# Patient Record
Sex: Male | Born: 1980 | Race: White | Hispanic: No | Marital: Married | Smoking: Former smoker
Health system: Southern US, Community
[De-identification: ages and names within clinical notes are randomized; demographics above are authoritative.]

## PROBLEM LIST (undated history)

## (undated) DIAGNOSIS — G2579 Other drug induced movement disorders: Secondary | ICD-10-CM

## (undated) DIAGNOSIS — F419 Anxiety disorder, unspecified: Secondary | ICD-10-CM

## (undated) DIAGNOSIS — G8929 Other chronic pain: Secondary | ICD-10-CM

## (undated) DIAGNOSIS — F329 Major depressive disorder, single episode, unspecified: Secondary | ICD-10-CM

## (undated) DIAGNOSIS — E349 Endocrine disorder, unspecified: Secondary | ICD-10-CM

## (undated) DIAGNOSIS — M1990 Unspecified osteoarthritis, unspecified site: Secondary | ICD-10-CM

## (undated) DIAGNOSIS — G9081 Serotonin syndrome: Secondary | ICD-10-CM

## (undated) DIAGNOSIS — G4730 Sleep apnea, unspecified: Secondary | ICD-10-CM

## (undated) DIAGNOSIS — F32A Depression, unspecified: Secondary | ICD-10-CM

## (undated) DIAGNOSIS — F909 Attention-deficit hyperactivity disorder, unspecified type: Secondary | ICD-10-CM

## (undated) DIAGNOSIS — M549 Dorsalgia, unspecified: Secondary | ICD-10-CM

## (undated) HISTORY — DX: Depression, unspecified: F32.A

## (undated) HISTORY — DX: Dorsalgia, unspecified: M54.9

## (undated) HISTORY — DX: Other chronic pain: G89.29

## (undated) HISTORY — DX: Attention-deficit hyperactivity disorder, unspecified type: F90.9

## (undated) HISTORY — DX: Other drug induced movement disorders: G25.79

## (undated) HISTORY — DX: Serotonin syndrome: G90.81

## (undated) HISTORY — DX: Major depressive disorder, single episode, unspecified: F32.9

## (undated) HISTORY — DX: Unspecified osteoarthritis, unspecified site: M19.90

## (undated) HISTORY — DX: Endocrine disorder, unspecified: E34.9

## (undated) HISTORY — DX: Depression, unspecified: F41.9

## (undated) HISTORY — DX: Sleep apnea, unspecified: G47.30

---
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
## 2016-05-23 ENCOUNTER — Ambulatory Visit (INDEPENDENT_AMBULATORY_CARE_PROVIDER_SITE_OTHER): Payer: 59 | Admitting: Primary Care

## 2016-05-23 ENCOUNTER — Ambulatory Visit (INDEPENDENT_AMBULATORY_CARE_PROVIDER_SITE_OTHER)
Admission: RE | Admit: 2016-05-23 | Discharge: 2016-05-23 | Disposition: A | Discharge status: Home / Self Care | Payer: 59 | Source: Ambulatory Visit | Attending: Primary Care | Admitting: Primary Care

## 2016-05-23 ENCOUNTER — Encounter: Payer: Self-pay | Admitting: Primary Care

## 2016-05-23 VITALS — BP 152/110 | HR 96 | Temp 98.0°F | Ht 71.0 in | Wt 205.6 lb

## 2016-05-23 DIAGNOSIS — L600 Ingrowing nail: Secondary | ICD-10-CM

## 2016-05-23 DIAGNOSIS — E349 Endocrine disorder, unspecified: Secondary | ICD-10-CM

## 2016-05-23 DIAGNOSIS — F329 Major depressive disorder, single episode, unspecified: Secondary | ICD-10-CM

## 2016-05-23 DIAGNOSIS — M5440 Lumbago with sciatica, unspecified side: Secondary | ICD-10-CM

## 2016-05-23 DIAGNOSIS — M79642 Pain in left hand: Secondary | ICD-10-CM

## 2016-05-23 DIAGNOSIS — F418 Other specified anxiety disorders: Secondary | ICD-10-CM

## 2016-05-23 DIAGNOSIS — F419 Anxiety disorder, unspecified: Secondary | ICD-10-CM

## 2016-05-23 DIAGNOSIS — G8929 Other chronic pain: Secondary | ICD-10-CM

## 2016-05-23 DIAGNOSIS — M5442 Lumbago with sciatica, left side: Secondary | ICD-10-CM

## 2016-05-23 MED ORDER — PREDNISONE 20 MG PO TABS: ORAL_TABLET | ORAL | 0 refills | Status: DC

## 2016-05-23 NOTE — Progress Notes (Signed)
Subjective:    Patient ID: Darren Mason, male    DOB: [birth year], 35 y.o.   MRN: [id number]  HPI  Darren Mason is a 35 year old male who presents today to establish care and discuss the problems mentioned below. Will obtain old records. He still follows with his PCP in [place], [place].   1) Anxiety and Depression: Diagnosed 5 years ago. Currently managed on Wellbutrin XL 300 mg and Lexapro 10 mg. Overall he feels well managed, he does experience feeling down during winter months mostly. Denies SI/HI.   2) Chronic Back Pain: MRI three years ago with evidence of three bulging discs to his lower back with arthritis. He does experience radiculopathy to his left lower extremity. Managed on Norco, diclofenac, carisoprodol and follows with pain management (in [place], [place]).   3) Testosterone Deficiency: History of fatigue, sleeping 12 hours with frequent naps. Noted to have low testosterone levels several years ago and placed on supplemental testosterone. Currently managed on testosterone cypionate which was provided by his prior PCP. He's been managed on testosterone for the past 3-4 years. He continues to see his PCP who provides this.   4) Hand Pain: Also with swelling. Located to the left hand which has been present for the past 1-2 weeks. His symptoms began with a blister, then he noticed tenderness to the dorsal left hand. He denies injury/trauma, changes in skin color. He does work with his hands often. He has a history of moderate arthritis.   5) Ingrown Toe Nails: Located to bilateral great toes since his adolescence. Over the past several years he's noticed that his ingrown toe nails have become more painful. He generally cuts these out himself, but they continue to grow back. Over the last several weeks he's noticed inflammation, swelling, and increased pain.   Review of Systems  Constitutional: Negative for fever.  Respiratory: Negative for shortness of breath.   Cardiovascular: Negative  for chest pain.  Genitourinary:       Managed on IM testosterone  Musculoskeletal: Positive for arthralgias and back pain.  Skin: Negative for color change.  Allergic/Immunologic: Negative for environmental allergies.  Neurological: Negative for weakness.  Hematological: Negative for adenopathy.  Psychiatric/Behavioral: Negative for suicidal ideas.       No past medical history on file.   Social History   Social History  . Marital status: Married    Spouse name: N/A  . Number of children: N/A  . Years of education: N/A   Occupational History  . Not on file.   Social History Main Topics  . Smoking status: Never Smoker  . Smokeless tobacco: Not on file  . Alcohol use Yes  . Drug use: Unknown  . Sexual activity: Not on file   Other Topics Concern  . Not on file   Social History Narrative  . No narrative on file    No past surgical history on file.  No family history on file.  No Known Allergies  No current outpatient prescriptions on file prior to visit.   No current facility-administered medications on file prior to visit.     BP (!) 152/110   Pulse 96   Temp 98 F (36.7 C) (Oral)   Ht 5\' 11"  (1.803 m)   Wt 205 lb 9.6 oz (93.3 kg)   SpO2 98%   BMI 28.68 kg/m    Objective:   Physical Exam  Constitutional: He is oriented to person, place, and time. He appears well-nourished.  HENT:  Head:  Normocephalic.  Neck: Neck supple.  Cardiovascular: Normal rate and regular rhythm.   Pulmonary/Chest: Effort normal and breath sounds normal. He has no wheezes. He has no rales.  Musculoskeletal: Normal range of motion.       Left wrist: He exhibits normal range of motion, no tenderness and no swelling.       Right hand: He exhibits tenderness, bony tenderness and swelling. He exhibits normal range of motion and normal capillary refill. Normal sensation noted. Normal strength noted.  Neurological: He is alert and oriented to person, place, and time.  Skin: Skin  is warm and dry. No erythema.  Mild to moderate swelling to left dorsal hand. Good ROM. Tender to general left hand.  Psychiatric: He has a normal mood and affect.          Assessment & Plan:  Hand Swelling:  Located to left hand, also with swelling. No recent trauma. Based off of exam appears to be arthritic flare. Will treat with prednisone burst. Discussed to refrain from diclofenac while on prednisone. Xray without evidence of bony involvement/infection. Ice, rest, follow up PRN.  Morrie Sheldonlark,[name] Kendal, NP

## 2016-05-23 NOTE — Patient Instructions (Addendum)
Complete xray(s) prior to leaving today. I will notify you of your results once received.  Start Prednisone 20 mg. Take 2 tablets by mouth daily for 5 days. Do not take Diclofenac while taking prednisone for swelling and pain.  You will be contacted regarding your referral to Podiatry.  Please let us know if you have not heard back within one week.   It was a pleasure to meet you today! Please don't hesitate to call me with any questions. Welcome to [facility]!

## 2016-05-23 NOTE — Progress Notes (Signed)
Pre visit review using our clinic review tool, if applicable. No additional management support is needed unless otherwise documented below in the visit note. 

## 2016-05-24 DIAGNOSIS — E349 Endocrine disorder, unspecified: Secondary | ICD-10-CM | POA: Insufficient documentation

## 2016-05-24 DIAGNOSIS — M1990 Unspecified osteoarthritis, unspecified site: Secondary | ICD-10-CM | POA: Insufficient documentation

## 2016-05-24 DIAGNOSIS — F419 Anxiety disorder, unspecified: Secondary | ICD-10-CM

## 2016-05-24 DIAGNOSIS — G8929 Other chronic pain: Secondary | ICD-10-CM | POA: Insufficient documentation

## 2016-05-24 DIAGNOSIS — F329 Major depressive disorder, single episode, unspecified: Secondary | ICD-10-CM | POA: Insufficient documentation

## 2016-05-24 DIAGNOSIS — M549 Dorsalgia, unspecified: Secondary | ICD-10-CM

## 2016-05-24 DIAGNOSIS — L600 Ingrowing nail: Secondary | ICD-10-CM | POA: Insufficient documentation

## 2016-05-24 NOTE — Assessment & Plan Note (Signed)
Chronic, overall worse. Exam today with obvious irritation without infection to bilateral great toes. Referral placed for podiatry.

## 2016-05-24 NOTE — Assessment & Plan Note (Signed)
Evidence of 3 bulging discs to L spine per MRI. Managed by pain management in [place], [place].

## 2016-05-24 NOTE — Assessment & Plan Note (Signed)
Managed on Wellbutrin and Lexapro, feels well managed. Continue current regimen.

## 2016-05-24 NOTE — Assessment & Plan Note (Signed)
Managed on IM testosterone provided by his PCP in [place]. Discussed that I do not manage this and that a referral to Urology would be in order. He states that he will stay with his current PCP for his refills.

## 2016-05-24 NOTE — Assessment & Plan Note (Signed)
Diagnosed per orthopedics. Suspect hand irritation related to arthritic flare.

## 2016-06-03 ENCOUNTER — Ambulatory Visit: Payer: 59 | Admitting: Podiatry

## 2016-12-16 ENCOUNTER — Other Ambulatory Visit (HOSPITAL_COMMUNITY): Payer: Self-pay | Admitting: Pain Medicine

## 2016-12-16 DIAGNOSIS — M5416 Radiculopathy, lumbar region: Secondary | ICD-10-CM

## 2017-01-19 ENCOUNTER — Emergency Department (HOSPITAL_COMMUNITY): Payer: 59

## 2017-01-19 ENCOUNTER — Inpatient Hospital Stay (HOSPITAL_COMMUNITY)
Admission: EM | Admit: 2017-01-19 | Discharge: 2017-01-21 | DRG: 917 | Disposition: A | Discharge status: Home / Self Care | Payer: 59 | Attending: Internal Medicine | Admitting: Internal Medicine

## 2017-01-19 ENCOUNTER — Ambulatory Visit: Payer: 59

## 2017-01-19 DIAGNOSIS — E872 Acidosis: Secondary | ICD-10-CM | POA: Diagnosis present

## 2017-01-19 DIAGNOSIS — J9600 Acute respiratory failure, unspecified whether with hypoxia or hypercapnia: Secondary | ICD-10-CM | POA: Diagnosis present

## 2017-01-19 DIAGNOSIS — T407X1A Poisoning by cannabis (derivatives), accidental (unintentional), initial encounter: Secondary | ICD-10-CM | POA: Diagnosis present

## 2017-01-19 DIAGNOSIS — F419 Anxiety disorder, unspecified: Secondary | ICD-10-CM | POA: Diagnosis present

## 2017-01-19 DIAGNOSIS — T43221A Poisoning by selective serotonin reuptake inhibitors, accidental (unintentional), initial encounter: Secondary | ICD-10-CM | POA: Diagnosis present

## 2017-01-19 DIAGNOSIS — R402242 Coma scale, best verbal response, confused conversation, at arrival to emergency department: Secondary | ICD-10-CM | POA: Diagnosis present

## 2017-01-19 DIAGNOSIS — Z79899 Other long term (current) drug therapy: Secondary | ICD-10-CM | POA: Diagnosis not present

## 2017-01-19 DIAGNOSIS — R258 Other abnormal involuntary movements: Secondary | ICD-10-CM | POA: Diagnosis present

## 2017-01-19 DIAGNOSIS — F17290 Nicotine dependence, other tobacco product, uncomplicated: Secondary | ICD-10-CM | POA: Diagnosis present

## 2017-01-19 DIAGNOSIS — R402332 Coma scale, best motor response, abnormal, at arrival to emergency department: Secondary | ICD-10-CM | POA: Diagnosis present

## 2017-01-19 DIAGNOSIS — G92 Toxic encephalopathy: Secondary | ICD-10-CM | POA: Diagnosis present

## 2017-01-19 DIAGNOSIS — F1210 Cannabis abuse, uncomplicated: Secondary | ICD-10-CM | POA: Diagnosis present

## 2017-01-19 DIAGNOSIS — I4581 Long QT syndrome: Secondary | ICD-10-CM | POA: Diagnosis present

## 2017-01-19 DIAGNOSIS — G2579 Other drug induced movement disorders: Secondary | ICD-10-CM | POA: Diagnosis not present

## 2017-01-19 DIAGNOSIS — T43621A Poisoning by amphetamines, accidental (unintentional), initial encounter: Secondary | ICD-10-CM | POA: Diagnosis present

## 2017-01-19 DIAGNOSIS — R402112 Coma scale, eyes open, never, at arrival to emergency department: Secondary | ICD-10-CM | POA: Diagnosis present

## 2017-01-19 DIAGNOSIS — Z79891 Long term (current) use of opiate analgesic: Secondary | ICD-10-CM | POA: Diagnosis not present

## 2017-01-19 DIAGNOSIS — J9601 Acute respiratory failure with hypoxia: Secondary | ICD-10-CM | POA: Diagnosis not present

## 2017-01-19 DIAGNOSIS — Z7952 Long term (current) use of systemic steroids: Secondary | ICD-10-CM | POA: Diagnosis not present

## 2017-01-19 DIAGNOSIS — H5704 Mydriasis: Secondary | ICD-10-CM

## 2017-01-19 DIAGNOSIS — N179 Acute kidney failure, unspecified: Secondary | ICD-10-CM | POA: Diagnosis present

## 2017-01-19 DIAGNOSIS — M549 Dorsalgia, unspecified: Secondary | ICD-10-CM | POA: Diagnosis present

## 2017-01-19 DIAGNOSIS — F1510 Other stimulant abuse, uncomplicated: Secondary | ICD-10-CM | POA: Diagnosis present

## 2017-01-19 DIAGNOSIS — G8929 Other chronic pain: Secondary | ICD-10-CM | POA: Diagnosis present

## 2017-01-19 DIAGNOSIS — I639 Cerebral infarction, unspecified: Secondary | ICD-10-CM

## 2017-01-19 DIAGNOSIS — R402312 Coma scale, best motor response, none, at arrival to emergency department: Secondary | ICD-10-CM | POA: Diagnosis present

## 2017-01-19 DIAGNOSIS — R4182 Altered mental status, unspecified: Secondary | ICD-10-CM | POA: Diagnosis present

## 2017-01-19 DIAGNOSIS — F329 Major depressive disorder, single episode, unspecified: Secondary | ICD-10-CM | POA: Diagnosis present

## 2017-01-19 DIAGNOSIS — R4189 Other symptoms and signs involving cognitive functions and awareness: Secondary | ICD-10-CM

## 2017-01-19 DIAGNOSIS — R402212 Coma scale, best verbal response, none, at arrival to emergency department: Secondary | ICD-10-CM | POA: Diagnosis present

## 2017-01-19 LAB — I-STAT ARTERIAL BLOOD GAS, ED
Acid-Base Excess: 1 mmol/L (ref 0.0–2.0)
Acid-base deficit: 1 mmol/L (ref 0.0–2.0)
Bicarbonate: 28.1 mmol/L — ABNORMAL HIGH (ref 20.0–28.0)
Bicarbonate: 25.2 mmol/L (ref 20.0–28.0)
O2 SAT: 98 %
O2 Saturation: 99 %
PCO2 ART: 66.3 mmHg — AB (ref 32.0–48.0)
PCO2 ART: 39.1 mmHg (ref 32.0–48.0)
PH ART: 7.241 — AB (ref 7.350–7.450)
PH ART: 7.42 (ref 7.350–7.450)
Patient temperature: 100.2
Patient temperature: 99.89
TCO2: 30 mmol/L (ref 0–100)
TCO2: 26 mmol/L (ref 0–100)
pO2, Arterial: 168 mmHg — ABNORMAL HIGH (ref 83.0–108.0)
pO2, Arterial: 108 mmHg (ref 83.0–108.0)

## 2017-01-19 LAB — COMPREHENSIVE METABOLIC PANEL
ALBUMIN: 3.9 g/dL (ref 3.5–5.0)
ALK PHOS: 52 U/L (ref 38–126)
ALT: 56 U/L (ref 17–63)
AST: 30 U/L (ref 15–41)
Anion gap: 11 (ref 5–15)
BILIRUBIN TOTAL: 0.5 mg/dL (ref 0.3–1.2)
BUN: 15 mg/dL (ref 6–20)
CALCIUM: 8.4 mg/dL — AB (ref 8.9–10.3)
CO2: 23 mmol/L (ref 22–32)
CREATININE: 1.34 mg/dL — AB (ref 0.61–1.24)
Chloride: 103 mmol/L (ref 101–111)
GFR calc Af Amer: >60 mL/min (ref >60)
GLUCOSE: 135 mg/dL — AB (ref 65–99)
Potassium: 3.9 mmol/L (ref 3.5–5.1)
Sodium: 137 mmol/L (ref 135–145)
TOTAL PROTEIN: 6.5 g/dL (ref 6.5–8.1)

## 2017-01-19 LAB — DIFFERENTIAL
BASOS ABS: 0 10*3/uL (ref 0.0–0.1)
Basophils Relative: 0 %
EOS ABS: 0.4 10*3/uL (ref 0.0–0.7)
Eosinophils Relative: 3 %
LYMPHS ABS: 2.5 10*3/uL (ref 0.7–4.0)
LYMPHS PCT: 20 %
Monocytes Absolute: 0.6 10*3/uL (ref 0.1–1.0)
Monocytes Relative: 5 %
NEUTROS PCT: 72 %
Neutro Abs: 9.1 10*3/uL — ABNORMAL HIGH (ref 1.7–7.7)

## 2017-01-19 LAB — CBC
HEMATOCRIT: 44.2 % (ref 39.0–52.0)
HEMOGLOBIN: 14.8 g/dL (ref 13.0–17.0)
MCH: 30.6 pg (ref 26.0–34.0)
MCHC: 33.5 g/dL (ref 30.0–36.0)
MCV: 91.3 fL (ref 78.0–100.0)
Platelets: 213 10*3/uL (ref 150–400)
RBC: 4.84 MIL/uL (ref 4.22–5.81)
RDW: 12.8 % (ref 11.5–15.5)
WBC: 12.6 10*3/uL — ABNORMAL HIGH (ref 4.0–10.5)

## 2017-01-19 LAB — URINALYSIS, ROUTINE W REFLEX MICROSCOPIC
Bilirubin Urine: NEGATIVE
GLUCOSE, UA: NEGATIVE mg/dL
Hgb urine dipstick: NEGATIVE
KETONES UR: NEGATIVE mg/dL
LEUKOCYTES UA: NEGATIVE
Nitrite: NEGATIVE
PROTEIN: NEGATIVE mg/dL
Specific Gravity, Urine: 1.021 (ref 1.005–1.030)
pH: 7 (ref 5.0–8.0)

## 2017-01-19 LAB — I-STAT CHEM 8, ED
BUN: 23 mg/dL — ABNORMAL HIGH (ref 6–20)
CREATININE: 1.3 mg/dL — AB (ref 0.61–1.24)
Calcium, Ion: 1 mmol/L — ABNORMAL LOW (ref 1.15–1.40)
Chloride: 102 mmol/L (ref 101–111)
GLUCOSE: 135 mg/dL — AB (ref 65–99)
HCT: 45 % (ref 39.0–52.0)
HEMOGLOBIN: 15.3 g/dL (ref 13.0–17.0)
POTASSIUM: 4.6 mmol/L (ref 3.5–5.1)
Sodium: 137 mmol/L (ref 135–145)
TCO2: 27 mmol/L (ref 0–100)

## 2017-01-19 LAB — I-STAT TROPONIN, ED: TROPONIN I, POC: 0.02 ng/mL (ref 0.00–0.08)

## 2017-01-19 LAB — RAPID URINE DRUG SCREEN, HOSP PERFORMED
Amphetamines: POSITIVE — AB
BARBITURATES: NOT DETECTED
Benzodiazepines: POSITIVE — AB
COCAINE: NOT DETECTED
Opiates: NOT DETECTED
TETRAHYDROCANNABINOL: NOT DETECTED

## 2017-01-19 LAB — APTT: APTT: 23 s — AB (ref 24–36)

## 2017-01-19 LAB — TRIGLYCERIDES: Triglycerides: 266 mg/dL — ABNORMAL HIGH (ref <150)

## 2017-01-19 LAB — CK: Total CK: 222 U/L (ref 49–397)

## 2017-01-19 LAB — PROTIME-INR
INR: 1.04
Prothrombin Time: 13.7 seconds (ref 11.4–15.2)

## 2017-01-19 LAB — SALICYLATE LEVEL: Salicylate Lvl: <7 mg/dL (ref 2.8–30.0)

## 2017-01-19 LAB — ACETAMINOPHEN LEVEL: Acetaminophen (Tylenol), Serum: <10 ug/mL — ABNORMAL LOW (ref 10–30)

## 2017-01-19 LAB — ETHANOL: Alcohol, Ethyl (B): <5 mg/dL (ref <5)

## 2017-01-19 MED ORDER — ACTIDOSE WITH SORBITOL 50 GM/240ML PO LIQD
100.0000 g | Freq: Once | ORAL | Status: AC
  Administered 2017-01-20: 100 g via ORAL
  Filled 2017-01-19: qty 480

## 2017-01-19 MED ORDER — SODIUM CHLORIDE 0.9 % IV SOLN: 250.0000 mL | INTRAVENOUS | Status: DC | PRN

## 2017-01-19 MED ORDER — IOPAMIDOL (ISOVUE-370) INJECTION 76%
50.0000 mL | Freq: Once | INTRAVENOUS | Status: AC | PRN
  Administered 2017-01-19: 50 mL via INTRAVENOUS

## 2017-01-19 MED ORDER — SODIUM CHLORIDE 0.9 % IV SOLN
INTRAVENOUS | Status: AC | PRN
  Administered 2017-01-19: 1000 mL via INTRAVENOUS

## 2017-01-19 MED ORDER — PROPOFOL 1000 MG/100ML IV EMUL
INTRAVENOUS | Status: AC
  Administered 2017-01-20: 90 ug/kg/min via INTRAVENOUS
  Filled 2017-01-19: qty 100

## 2017-01-19 MED ORDER — PROPOFOL 1000 MG/100ML IV EMUL
INTRAVENOUS | Status: AC
  Administered 2017-01-19: 5 ug/min via INTRAVENOUS
  Filled 2017-01-19: qty 100

## 2017-01-19 MED ORDER — ROCURONIUM BROMIDE 50 MG/5ML IV SOLN
INTRAVENOUS | Status: AC | PRN
  Administered 2017-01-19: 100 mg via INTRAVENOUS

## 2017-01-19 MED ORDER — PANTOPRAZOLE SODIUM 40 MG IV SOLR
40.0000 mg | Freq: Every day | INTRAVENOUS | Status: DC
  Administered 2017-01-20: 40 mg via INTRAVENOUS
  Filled 2017-01-19: qty 40

## 2017-01-19 MED ORDER — MIDAZOLAM HCL 2 MG/2ML IJ SOLN
2.0000 mg | INTRAMUSCULAR | Status: DC | PRN
  Administered 2017-01-19 – 2017-01-20 (×3): 2 mg via INTRAVENOUS
  Filled 2017-01-19 (×2): qty 2

## 2017-01-19 MED ORDER — FENTANYL CITRATE (PF) 100 MCG/2ML IJ SOLN
100.0000 ug | INTRAMUSCULAR | Status: DC | PRN
  Administered 2017-01-19 (×2): 100 ug via INTRAVENOUS
  Filled 2017-01-19: qty 2

## 2017-01-19 MED ORDER — MIDAZOLAM HCL 2 MG/2ML IJ SOLN
INTRAMUSCULAR | Status: AC
  Filled 2017-01-19: qty 2

## 2017-01-19 MED ORDER — MIDAZOLAM HCL 2 MG/2ML IJ SOLN
2.0000 mg | INTRAMUSCULAR | Status: DC | PRN
  Administered 2017-01-19: 2 mg via INTRAVENOUS
  Filled 2017-01-19: qty 2

## 2017-01-19 MED ORDER — ETOMIDATE 2 MG/ML IV SOLN
INTRAVENOUS | Status: AC | PRN
  Administered 2017-01-19: 30 mg via INTRAVENOUS

## 2017-01-19 MED ORDER — HEPARIN SODIUM (PORCINE) 5000 UNIT/ML IJ SOLN
5000.0000 [IU] | Freq: Three times a day (TID) | INTRAMUSCULAR | Status: DC
  Administered 2017-01-20 – 2017-01-21 (×4): 5000 [IU] via SUBCUTANEOUS
  Filled 2017-01-19 (×6): qty 1

## 2017-01-19 MED ORDER — FENTANYL CITRATE (PF) 100 MCG/2ML IJ SOLN
INTRAMUSCULAR | Status: AC
  Administered 2017-01-19: 100 ug via INTRAVENOUS
  Filled 2017-01-19: qty 2

## 2017-01-19 MED ORDER — PROPOFOL 1000 MG/100ML IV EMUL
0.0000 ug/kg/min | INTRAVENOUS | Status: DC
  Administered 2017-01-19: 5 ug/min via INTRAVENOUS
  Administered 2017-01-20: 50 ug/kg/min via INTRAVENOUS
  Administered 2017-01-20: 90 ug/kg/min via INTRAVENOUS
  Administered 2017-01-20: 50 ug/kg/min via INTRAVENOUS
  Filled 2017-01-19 (×2): qty 100

## 2017-01-19 MED ORDER — SODIUM CHLORIDE 0.9 % IV SOLN
INTRAVENOUS | Status: DC
  Administered 2017-01-20 – 2017-01-21 (×3): via INTRAVENOUS

## 2017-01-19 MED ORDER — FENTANYL CITRATE (PF) 100 MCG/2ML IJ SOLN: 100.0000 ug | INTRAMUSCULAR | Status: DC | PRN

## 2017-01-19 MED ORDER — PROPOFOL 1000 MG/100ML IV EMUL
INTRAVENOUS | Status: AC
  Filled 2017-01-19: qty 100

## 2017-01-19 NOTE — H&P (Signed)
PULMONARY / CRITICAL CARE MEDICINE   Name: Darren Mason MRN: [id number] DOB: Jul 30, 1980    ADMISSION DATE:  01/19/2017 CONSULTATION DATE:  01/19/17  REFERRING MD:  Dr. Damita Dunnings  CHIEF COMPLAINT:  AMS  HISTORY OF PRESENT ILLNESS:   36yo male with PMH anxiety, depression, chronic back pain and recent possible tick bite here with AMS. History obtained per chart review and from family. 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 He improved with IVF and was started on doxycycline, and was told to go to the ER for further evaluation which he did not do. He had a follow up visit at urgent care on 01/16/17 for repeat bloodwork, at which time his symptoms had improved. Per family he did well this week and was last seen in usual state of health around 3:30pm today. He was found slumped over and mumbling around 5:15pm, trying to move his arms but having difficulty. He subsequently worsened and on EMS arrival was found to be crawling on floor and speaking incomprehensibly. Received Versed en route to ED. On arrival to ED was GCS 3, dilated pupils, snoring respirations and required intubation for airway protection. Per ED provider, patient's family stated that patient takes K2, a form of spice, and also has had a significantly depressed mood lately although mother and brother denied both on admission. Family does state that he has needed increased stimulant intake lately, several monster drinks and a pack of pill from the convenience store of unknown type.   PAST MEDICAL HISTORY :  Anxiety, Depression, Chronic Back pain  PAST SURGICAL HISTORY: He  has no past surgical history on file.  No Known Allergies  No current facility-administered medications on file prior to encounter.    Current Outpatient Prescriptions on File Prior to Encounter  Medication Sig  . buPROPion (WELLBUTRIN XL) 300 MG 24 hr tablet Take 300  mg by mouth daily.   . carisoprodol (SOMA) 350 MG tablet Take 350 mg by mouth 3 (three) times daily.   . diclofenac (VOLTAREN) 75 MG EC tablet Take 75 mg by mouth 2 (two) times daily.   Marland Kitchen escitalopram (LEXAPRO) 10 MG tablet Take 10 mg by mouth daily.   Marland Kitchen HYDROcodone-acetaminophen (NORCO) 10-325 MG tablet Take 2 tablets by mouth every 6 (six) hours as needed.   . predniSONE (DELTASONE) 20 MG tablet Take 2 tablet by mouth daily for 5 days.  Marland Kitchen testosterone cypionate (DEPOTESTOSTERONE CYPIONATE) 200 MG/ML injection Inject 100 mg into the muscle. Every 10 days    FAMILY HISTORY:  His has no family status information on file.    SOCIAL HISTORY: He  reports that he has never smoked. He does not have any smokeless tobacco history on file. He reports that he drinks alcohol.  Uses tobacco dip only ?h/o K2 use  REVIEW OF SYSTEMS:   Per HPI, unable to obtain d/t patient obtunded.   VITAL SIGNS: BP 125/79   Pulse 89   Temp 98.2 F (36.8 C)   Resp 18   Ht 6' (1.829 m)   Wt 100.2 kg (220 lb 14.4 oz)   SpO2 94%   BMI 29.96 kg/m   HEMODYNAMICS:    VENTILATOR SETTINGS: Vent Mode: PRVC FiO2 (%):  [50 %-100 %] 50 % Set Rate:  [14 bmp-22 bmp] 18 bmp Vt Set:  [620 mL] 620 mL PEEP:  [5 cmH20] 5 cmH20 Plateau Pressure:  [17 cmH20-20 cmH20]  18 cmH20  INTAKE / OUTPUT: No intake/output data recorded.  PHYSICAL EXAMINATION: General:  Laying in bed on vent, in NAD Neuro:  Obtunded, withdraws from painful stimuli. Hyperreflexia. downgoing to babinski  HEENT:  [place], AT. Pupils dilated bilaterally and minimally responsive Cardiovascular:  RRR, no murmurs Lungs:  CTAB, normal effort on vent Abdomen:  Soft, nontender, nondistended, normoactive bowel sounds Musculoskeletal:  Normal bulk and tone, no hyper-ridgidity  Skin:  Warm and dry, no rashes  LABS:  BMET  Recent Labs Lab 01/19/17 1856 01/19/17 1907  NA 137 137  K 3.9 4.6  CL 103 102  CO2 23  --   BUN 15 23*  CREATININE 1.34*  1.30*  GLUCOSE 135* 135*    Electrolytes  Recent Labs Lab 01/19/17 1856  CALCIUM 8.4*    CBC  Recent Labs Lab 01/19/17 1856 01/19/17 1907  WBC 12.6*  --   HGB 14.8 15.3  HCT 44.2 45.0  PLT 213  --     Coag's  Recent Labs Lab 01/19/17 1856  APTT 23*  INR 1.04    Sepsis Markers No results for input(s): LATICACIDVEN, PROCALCITON, O2SATVEN in the last 168 hours.  ABG  Recent Labs Lab 01/19/17 1953 01/19/17 2148  PHART 7.241* 7.420  PCO2ART 66.3* 39.1  PO2ART 168.0* 108.0    Liver Enzymes  Recent Labs Lab 01/19/17 1856  AST 30  ALT 56  ALKPHOS 52  BILITOT 0.5  ALBUMIN 3.9    Cardiac Enzymes No results for input(s): TROPONINI, PROBNP in the last 168 hours.  Glucose No results for input(s): GLUCAP in the last 168 hours.  Imaging Ct Angio Head W Or Wo Contrast  Result Date: 01/19/2017 CLINICAL DATA:  36 y/o  M; code stroke. EXAM: CT HEAD WITHOUT CONTRAST CT ANGIOGRAPHY HEAD AND NECK TECHNIQUE: Multidetector CT imaging of the head and neck was performed using the standard protocol during bolus administration of intravenous contrast. Multiplanar CT image reconstructions and MIPs were obtained to evaluate the vascular anatomy. Carotid stenosis measurements (when applicable) are obtained utilizing NASCET criteria, using the distal internal carotid diameter as the denominator. CONTRAST:  50 cc Isovue 370 COMPARISON:  None. FINDINGS: CT HEAD FINDINGS Brain: No evidence of acute infarction, hemorrhage, hydrocephalus, extra-axial collection or mass lesion/mass effect. ASPECTS is 10. Vascular: As below. Skull: Normal. Negative for fracture or focal lesion. Sinuses: Imaged portions are clear. Orbits: No acute finding. Review of the MIP images confirms the above findings CTA NECK FINDINGS Aortic arch: Standard branching. Imaged portion shows no evidence of aneurysm or dissection. No significant stenosis of the major arch vessel origins. Right carotid system: No  evidence of dissection, stenosis (50% or greater) or occlusion. Left carotid system: No evidence of dissection, stenosis (50% or greater) or occlusion. Vertebral arteries: Right dominant. No evidence of dissection, stenosis (50% or greater) or occlusion. Skeleton: No significant cervical degenerative changes. No high-grade bony canal stenosis. No acute osseous abnormality. Other neck: Negative. Upper chest: Endotracheal tube and nasoenteric tubes noted. Review of the MIP images confirms the above findings CTA HEAD FINDINGS Anterior circulation: No significant stenosis, proximal occlusion, aneurysm, or vascular malformation. Posterior circulation: No significant stenosis, proximal occlusion, aneurysm, or vascular malformation. Venous sinuses: As permitted by contrast timing, patent. Anatomic variants: Patent anterior communicating artery and right posterior communicating artery. No left posterior communicating artery identified, likely hypoplastic or absent. Review of the MIP images confirms the above findings IMPRESSION: CT head: 1. No acute intracranial abnormality identified. 2. ASPECTS is 10. CTA neck:  Carotid and vertebral arteries are patent. No dissection, aneurysm, or significant stenosis is identified. CTA head: Patent circle of Willis. No large vessel occlusion, aneurysm, or significant stenosis is identified. These results were called by telephone at the time of interpretation on 01/19/2017 at 7:50 pm to Dr. Marily Memos, who verbally acknowledged these results. Electronically Signed   By: Mitzi Hansen M.D.   On: 01/19/2017 19:53   Ct Angio Neck W Or Wo Contrast  Result Date: 01/19/2017 CLINICAL DATA:  35 y/o  M; code stroke. EXAM: CT HEAD WITHOUT CONTRAST CT ANGIOGRAPHY HEAD AND NECK TECHNIQUE: Multidetector CT imaging of the head and neck was performed using the standard protocol during bolus administration of intravenous contrast. Multiplanar CT image reconstructions and MIPs were  obtained to evaluate the vascular anatomy. Carotid stenosis measurements (when applicable) are obtained utilizing NASCET criteria, using the distal internal carotid diameter as the denominator. CONTRAST:  50 cc Isovue 370 COMPARISON:  None. FINDINGS: CT HEAD FINDINGS Brain: No evidence of acute infarction, hemorrhage, hydrocephalus, extra-axial collection or mass lesion/mass effect. ASPECTS is 10. Vascular: As below. Skull: Normal. Negative for fracture or focal lesion. Sinuses: Imaged portions are clear. Orbits: No acute finding. Review of the MIP images confirms the above findings CTA NECK FINDINGS Aortic arch: Standard branching. Imaged portion shows no evidence of aneurysm or dissection. No significant stenosis of the major arch vessel origins. Right carotid system: No evidence of dissection, stenosis (50% or greater) or occlusion. Left carotid system: No evidence of dissection, stenosis (50% or greater) or occlusion. Vertebral arteries: Right dominant. No evidence of dissection, stenosis (50% or greater) or occlusion. Skeleton: No significant cervical degenerative changes. No high-grade bony canal stenosis. No acute osseous abnormality. Other neck: Negative. Upper chest: Endotracheal tube and nasoenteric tubes noted. Review of the MIP images confirms the above findings CTA HEAD FINDINGS Anterior circulation: No significant stenosis, proximal occlusion, aneurysm, or vascular malformation. Posterior circulation: No significant stenosis, proximal occlusion, aneurysm, or vascular malformation. Venous sinuses: As permitted by contrast timing, patent. Anatomic variants: Patent anterior communicating artery and right posterior communicating artery. No left posterior communicating artery identified, likely hypoplastic or absent. Review of the MIP images confirms the above findings IMPRESSION: CT head: 1. No acute intracranial abnormality identified. 2. ASPECTS is 10. CTA neck: Carotid and vertebral arteries are patent.  No dissection, aneurysm, or significant stenosis is identified. CTA head: Patent circle of Willis. No large vessel occlusion, aneurysm, or significant stenosis is identified. These results were called by telephone at the time of interpretation on 01/19/2017 at 7:50 pm to Dr. Marily Memos, who verbally acknowledged these results. Electronically Signed   By: Mitzi Hansen M.D.   On: 01/19/2017 19:53   Mr Brain Wo Contrast  Result Date: 01/19/2017 CLINICAL DATA:  36 y/o M; altered mental status, evaluate for stroke. EXAM: MRI HEAD WITHOUT CONTRAST TECHNIQUE: Multiplanar, multiecho pulse sequences of the brain and surrounding structures were obtained without intravenous contrast. COMPARISON:  None. FINDINGS: Brain: No acute infarction, hemorrhage, hydrocephalus, extra-axial collection or mass lesion. Few scattered nonspecific punctate foci of T2 FLAIR hyperintensity in subcortical white matter of unlikely clinical significance. Vascular: Normal flow voids. Skull and upper cervical spine: Normal marrow signal. Sinuses/Orbits: Mild maxillary sinus mucosal thickening. Otherwise negative. Other: None. IMPRESSION: No acute intracranial abnormality identified. Unremarkable MRI of the brain. Electronically Signed   By: Mitzi Hansen M.D.   On: 01/19/2017 22:04   Dg Chest Portable 1 View  Result Date: 01/19/2017 CLINICAL DATA:  Intubated, code  stroke EXAM: PORTABLE CHEST 1 VIEW COMPARISON:  None. FINDINGS: Enteric tube enters stomach with the tip not seen on this image. Endotracheal tube tip is 5.0 cm above the carina. Normal mediastinal contour. No pneumothorax. No pleural effusion. Mild bibasilar scarring versus atelectasis. No pulmonary edema. IMPRESSION: 1. Endotracheal tube tip 5.0 cm above the carina. 2.  Enteric tube enters stomach with the tip not seen on this image. 3. Mild bibasilar scarring versus atelectasis. Electronically Signed   By: Delbert Phenix M.D.   On: 01/19/2017 19:54   Ct  Head Code Stroke W/o Cm  Result Date: 01/19/2017 CLINICAL DATA:  36 y/o  M; code stroke. EXAM: CT HEAD WITHOUT CONTRAST CT ANGIOGRAPHY HEAD AND NECK TECHNIQUE: Multidetector CT imaging of the head and neck was performed using the standard protocol during bolus administration of intravenous contrast. Multiplanar CT image reconstructions and MIPs were obtained to evaluate the vascular anatomy. Carotid stenosis measurements (when applicable) are obtained utilizing NASCET criteria, using the distal internal carotid diameter as the denominator. CONTRAST:  50 cc Isovue 370 COMPARISON:  None. FINDINGS: CT HEAD FINDINGS Brain: No evidence of acute infarction, hemorrhage, hydrocephalus, extra-axial collection or mass lesion/mass effect. ASPECTS is 10. Vascular: As below. Skull: Normal. Negative for fracture or focal lesion. Sinuses: Imaged portions are clear. Orbits: No acute finding. Review of the MIP images confirms the above findings CTA NECK FINDINGS Aortic arch: Standard branching. Imaged portion shows no evidence of aneurysm or dissection. No significant stenosis of the major arch vessel origins. Right carotid system: No evidence of dissection, stenosis (50% or greater) or occlusion. Left carotid system: No evidence of dissection, stenosis (50% or greater) or occlusion. Vertebral arteries: Right dominant. No evidence of dissection, stenosis (50% or greater) or occlusion. Skeleton: No significant cervical degenerative changes. No high-grade bony canal stenosis. No acute osseous abnormality. Other neck: Negative. Upper chest: Endotracheal tube and nasoenteric tubes noted. Review of the MIP images confirms the above findings CTA HEAD FINDINGS Anterior circulation: No significant stenosis, proximal occlusion, aneurysm, or vascular malformation. Posterior circulation: No significant stenosis, proximal occlusion, aneurysm, or vascular malformation. Venous sinuses: As permitted by contrast timing, patent. Anatomic variants:  Patent anterior communicating artery and right posterior communicating artery. No left posterior communicating artery identified, likely hypoplastic or absent. Review of the MIP images confirms the above findings IMPRESSION: CT head: 1. No acute intracranial abnormality identified. 2. ASPECTS is 10. CTA neck: Carotid and vertebral arteries are patent. No dissection, aneurysm, or significant stenosis is identified. CTA head: Patent circle of Willis. No large vessel occlusion, aneurysm, or significant stenosis is identified. These results were called by telephone at the time of interpretation on 01/19/2017 at 7:50 pm to Dr. Marily Memos, who verbally acknowledged these results. Electronically Signed   By: Mitzi Hansen M.D.   On: 01/19/2017 19:53   STUDIES:  CT angio head/neck 7/16> No acute intracranial abnormality identified.  CXR 7/16>  Mild basilar scarring vs atelectasis MRI brain 7/16> No acute intracranial abnormality. UDS 7/16> + amphetamines, benzo  CULTURES: none  ANTIBIOTICS: none  SIGNIFICANT EVENTS: 7/16 admitted  LINES/TUBES: ETT 7/16 > NGT 7/16>  Foley 7/16>  PIV x2  DISCUSSION: 36yo male with PMH anxiety, depression, chronic back pain and recent possible tick bite here with AMS. On arrival to ED was GCS 3, dilated pupils, snoring respirations and required intubation for airway protection. DDx is broad including polysubstance use given positive on rapid UDS for amphetamines and possible K2 and other stimulant use, medication interactions, seizures.  ASSESSMENT / PLAN:  NEUROLOGIC A:   Acute encephalopathy Concern for polysubstance abuse, + amphetamines on UDS and ?h/o K2 use P:   RASS goal: 0, -1 Propofol gtt PRN versed Frequent neuro checks EEG Neurology consulted  Urine drug screen Activated charcoal x1  PULMONARY A: Acute respiratory failure 2/2 AMS  P:   Full vent support Can wean if mental status improves Follow ABG and CXR VAP  prevention  CARDIOVASCULAR A:  Prolonged QTc, improved from 454 to 449 P:  Monitor on telemetry Echo   RENAL A:   AKI  P:   Monitor BMP Replete electrolytes as indicated IVF NS@125   GASTROINTESTINAL A:   GI ppx P:   protonix NPO  HEMATOLOGIC A:   DVT ppx P:  Heparin SQ  INFECTIOUS A:   No acute issues P:   Monitor off ABX  ENDOCRINE A:   No acute issues   P:   Monitor glucose on BMP  FAMILY  - Updates: Mother and brother updated at bedside.  Leland HerElsia J Yoo, DO PGY-2, [facility] 01/19/2017 10:35 PM   Attending Addendum: I personally examined this patient and agree with plan as detailed above. Briefly this is a 35yoM with history of chronic back pain on opiates, depression on several antidepressants, and polysubstance abuse, who is admitted with acute encephalopathy and respiratory failure. Last week he saw his PCP and at that time had diaphoresis, anxiety/agitation, and tremulousness but refused workup in the ER. Now today he was found minimally responsive on the couch (last seen normal 2hrs prior), with clonus, hyperreflexia, and dilated pupils in ER. Concern for possible serotonin syndrome. UDS positive for benzos and amphetamines. Intubated for airway protection. Consult Neurology and order EEG. Avoid fentanyl; give benzos as needed. Mild AKI giving IVF's. EKG shows normal QTc. ER MD is calling Poison control now.   60 minutes critical care time  Milana ObeyKathleen Gracy Ehly, MD Pulmonary & Critical Care

## 2017-01-19 NOTE — ED Notes (Signed)
Patient currently at MRI with RN and RT.

## 2017-01-19 NOTE — Progress Notes (Signed)
Patient arrived right shift change, I was able to examine him prior to intubation with the following findings:   Physical Exam  Constitutional: Appears well-developed and well-nourished.  Psych: Unresponsive Eyes: No scleral injection HENT: No OP obstrucion Head: Normocephalic.  Cardiovascular: Normal rate and regular rhythm.  Respiratory: Effort normal and breath sounds normal to anterior ascultation GI: Soft.  No distension. There is no tenderness.  Skin: WDI  Neuro: Mental Status: He is obtunded, does not follow commands or speak. Cranial Nerves: II: He does not blink to threat, pupils are 8 mm and nonreactive bilaterally III,IV, VI: With doll's eye maneuver, there is movement to the right,? Limited movement to the left V,VII: No clear corneal on the right, possibly weak corneal on the left Motor: Tone is normal, but he has no response to noxious stimulation  In any of his 4 extremities  Sensory: As above Deep Tendon Reflexes: 3+ and symmetric in the biceps and patellae, with several beats of nonsustained clonus in the ankles Plantars: Toes are downgoing bilaterally.  Cerebellar: Not performed   Following intubation, I have discussed with Dr. Otelia LimesLindzen who will perform his formal consultation.  Ritta SlotMcNeill Rye Dorado, MD [facility] [phone]  If 7pm- 7am, please page neurology on call as listed in [facility].

## 2017-01-19 NOTE — Consult Note (Addendum)
NEURO HOSPITALIST CONSULT NOTE   Requestig physician: Dr. Tyson Alias  Reason for Consult: AMS  History obtained from:  Family and Chart     HPI:                                                                                                                                          Darren Mason is an 36 y.o. male who was brought to the ED by EMS after being found combative and nonverbal with altered mentation at home. On arrival to the ED, he had normal saturations on NRB, but GCS of 3 with snoring respirations were concerning for imminent respiratory decompensation and he was intubated. Dr. Amada Jupiter obtained a brief neurological examination just prior to administration of paralytic agent for intubation. Following intubation an emergent CT of head with CTA of head and neck were obtained, all of which were normal. An MRI was then performed, which was also normal.   Per family, his Tresa Garter and Oxycodone/Tylenol prescription bottles were found empty, with one filled in late June and one July 1, both for 90 pills. His wife suspects that he may have been using "K2", a synthetic marijuana substitute, and a relative mentions that he had spoken about purchasing substances online in the past, recently stating that "a new drug" was on the market.   He was LKN at 3:30 PM. When he was found by family at 5:15 PM, he was slumped over on his couch with a blank expression, not fixating visually on faces, not answering questions. Also noted at times to be flailing his arms. A relative states that his pupils were dilated at that time. He was unable to walk and then was noted to be crawling on the floor on the way to the kitchen. No jerking, twitching, eyelid fluttering, eye jerking or other seizure-like movements were witnessed by family. On EMS arrival, he was incoherent and combative. En route, he was administered Versed. On arrival to the ED there was a question of lateralized gaze preference,  pupils were fixed and dilated and a trace corneal reflex was present in one eye. He was not moving to command but spontaneously briefly lifted his right arm. Temperature was noted to be 100.1.   No past medical history on file.  No past surgical history on file.  No family history on file.  Social History:  reports that he has never smoked. He does not have any smokeless tobacco history on file. He reports that he drinks alcohol. His drug history is not on file.  No Known Allergies  MEDICATIONS:  Prior to Admission:  Prescriptions Prior to Admission  Medication Sig Dispense Refill Last Dose  . buPROPion (WELLBUTRIN XL) 300 MG 24 hr tablet Take 300 mg by mouth daily.    Taking  . carisoprodol (SOMA) 350 MG tablet Take 350 mg by mouth 3 (three) times daily.    Taking  . diclofenac (VOLTAREN) 75 MG EC tablet Take 75 mg by mouth 2 (two) times daily.    Taking  . escitalopram (LEXAPRO) 10 MG tablet Take 10 mg by mouth daily.    Taking  . HYDROcodone-acetaminophen (NORCO) 10-325 MG tablet Take 2 tablets by mouth every 6 (six) hours as needed.    Taking  . predniSONE (DELTASONE) 20 MG tablet Take 2 tablet by mouth daily for 5 days. 10 tablet 0   . testosterone cypionate (DEPOTESTOSTERONE CYPIONATE) 200 MG/ML injection Inject 100 mg into the muscle. Every 10 days   Taking    ROS:                                                                                                                                       Unable to obtain due to AMS.   Blood pressure 125/79, pulse 89, temperature 98.2 F (36.8 C), resp. rate 18, height 6' (1.829 m), weight 100.2 kg (220 lb 14.4 oz), SpO2 94 %.  Initial Neurological exam by Dr. Amada Jupiter prior to administration of paralytic for intubation was as follows: Mental Status: He is obtunded, does not follow commands or speak. Cranial  Nerves: II: He does not blink to threat, pupils are 8 mm and nonreactive bilaterally III,IV, VI: With doll's eye maneuver, there is movement to the right,? Limited movement to the left V,VII: No clear corneal on the right, possibly weak corneal on the left Motor: Tone is normal, but he has no response to noxious stimulation in any of his 4 extremities  Sensory: As above Deep Tendon Reflexes: 3+ and symmetric in the biceps and patellae, with several beats of nonsustained clonus in the ankles Plantars: Toes are downgoing bilaterally.   Follow up Neurological exam after CT head and approximately 10 minutes after intubation, while on 10 mcg/kg/min propofol: Dilated unreactive pupils at 8 mm. No doll's eye reflex. No movement to voice or any noxious stimuli. Decreased tone all 4 extremities.   Neurological exam approximately 45 minutes after intubation: Murmurs, slightly opens eyes and turns head slightly with noxious stimuli. Slight movement of upper extremities to noxious. Withdraws lower extremities weakly to noxious. Pupils 7 mm with some reactivity to light. Not fixating on visual stimuli. Slight deviation of gaze towards noxious and verbal stimulus.   General Examination:  HEENT-  [place]/AT   Lungs- Intubated Abdomen- Nondistended Extremities- No edema, cyanosis or pallor  Lab Results: Basic Metabolic Panel:  Recent Labs Lab 01/19/17 1856 01/19/17 1907  NA 137 137  K 3.9 4.6  CL 103 102  CO2 23  --   GLUCOSE 135* 135*  BUN 15 23*  CREATININE 1.34* 1.30*  CALCIUM 8.4*  --     Liver Function Tests:  Recent Labs Lab 01/19/17 1856  AST 30  ALT 56  ALKPHOS 52  BILITOT 0.5  PROT 6.5  ALBUMIN 3.9   No results for input(s): LIPASE, AMYLASE in the last 168 hours. No results for input(s): AMMONIA in the last 168 hours.  CBC:  Recent Labs Lab 01/19/17 1856 01/19/17 1907   WBC 12.6*  --   NEUTROABS 9.1*  --   HGB 14.8 15.3  HCT 44.2 45.0  MCV 91.3  --   PLT 213  --     Cardiac Enzymes:  Recent Labs Lab 01/19/17 1912  CKTOTAL 222    Lipid Panel:  Recent Labs Lab 01/19/17 1912  TRIG 266*    CBG: No results for input(s): GLUCAP in the last 168 hours.  Microbiology: No results found for this or any previous visit.  Coagulation Studies:  Recent Labs  01/19/17 1856  LABPROT 13.7  INR 1.04    Imaging: Ct Angio Head W Or Wo Contrast  Result Date: 01/19/2017 CLINICAL DATA:  36 y/o  M; code stroke. EXAM: CT HEAD WITHOUT CONTRAST CT ANGIOGRAPHY HEAD AND NECK TECHNIQUE: Multidetector CT imaging of the head and neck was performed using the standard protocol during bolus administration of intravenous contrast. Multiplanar CT image reconstructions and MIPs were obtained to evaluate the vascular anatomy. Carotid stenosis measurements (when applicable) are obtained utilizing NASCET criteria, using the distal internal carotid diameter as the denominator. CONTRAST:  50 cc Isovue 370 COMPARISON:  None. FINDINGS: CT HEAD FINDINGS Brain: No evidence of acute infarction, hemorrhage, hydrocephalus, extra-axial collection or mass lesion/mass effect. ASPECTS is 10. Vascular: As below. Skull: Normal. Negative for fracture or focal lesion. Sinuses: Imaged portions are clear. Orbits: No acute finding. Review of the MIP images confirms the above findings CTA NECK FINDINGS Aortic arch: Standard branching. Imaged portion shows no evidence of aneurysm or dissection. No significant stenosis of the major arch vessel origins. Right carotid system: No evidence of dissection, stenosis (50% or greater) or occlusion. Left carotid system: No evidence of dissection, stenosis (50% or greater) or occlusion. Vertebral arteries: Right dominant. No evidence of dissection, stenosis (50% or greater) or occlusion. Skeleton: No significant cervical degenerative changes. No high-grade bony  canal stenosis. No acute osseous abnormality. Other neck: Negative. Upper chest: Endotracheal tube and nasoenteric tubes noted. Review of the MIP images confirms the above findings CTA HEAD FINDINGS Anterior circulation: No significant stenosis, proximal occlusion, aneurysm, or vascular malformation. Posterior circulation: No significant stenosis, proximal occlusion, aneurysm, or vascular malformation. Venous sinuses: As permitted by contrast timing, patent. Anatomic variants: Patent anterior communicating artery and right posterior communicating artery. No left posterior communicating artery identified, likely hypoplastic or absent. Review of the MIP images confirms the above findings IMPRESSION: CT head: 1. No acute intracranial abnormality identified. 2. ASPECTS is 10. CTA neck: Carotid and vertebral arteries are patent. No dissection, aneurysm, or significant stenosis is identified. CTA head: Patent circle of Willis. No large vessel occlusion, aneurysm, or significant stenosis is identified. These results were called by telephone at the time of interpretation on 01/19/2017 at 7:50 pm  to Dr. Marily Memos, who verbally acknowledged these results. Electronically Signed   By: Mitzi Hansen M.D.   On: 01/19/2017 19:53   Ct Angio Neck W Or Wo Contrast  Result Date: 01/19/2017 CLINICAL DATA:  36 y/o  M; code stroke. EXAM: CT HEAD WITHOUT CONTRAST CT ANGIOGRAPHY HEAD AND NECK TECHNIQUE: Multidetector CT imaging of the head and neck was performed using the standard protocol during bolus administration of intravenous contrast. Multiplanar CT image reconstructions and MIPs were obtained to evaluate the vascular anatomy. Carotid stenosis measurements (when applicable) are obtained utilizing NASCET criteria, using the distal internal carotid diameter as the denominator. CONTRAST:  50 cc Isovue 370 COMPARISON:  None. FINDINGS: CT HEAD FINDINGS Brain: No evidence of acute infarction, hemorrhage, hydrocephalus,  extra-axial collection or mass lesion/mass effect. ASPECTS is 10. Vascular: As below. Skull: Normal. Negative for fracture or focal lesion. Sinuses: Imaged portions are clear. Orbits: No acute finding. Review of the MIP images confirms the above findings CTA NECK FINDINGS Aortic arch: Standard branching. Imaged portion shows no evidence of aneurysm or dissection. No significant stenosis of the major arch vessel origins. Right carotid system: No evidence of dissection, stenosis (50% or greater) or occlusion. Left carotid system: No evidence of dissection, stenosis (50% or greater) or occlusion. Vertebral arteries: Right dominant. No evidence of dissection, stenosis (50% or greater) or occlusion. Skeleton: No significant cervical degenerative changes. No high-grade bony canal stenosis. No acute osseous abnormality. Other neck: Negative. Upper chest: Endotracheal tube and nasoenteric tubes noted. Review of the MIP images confirms the above findings CTA HEAD FINDINGS Anterior circulation: No significant stenosis, proximal occlusion, aneurysm, or vascular malformation. Posterior circulation: No significant stenosis, proximal occlusion, aneurysm, or vascular malformation. Venous sinuses: As permitted by contrast timing, patent. Anatomic variants: Patent anterior communicating artery and right posterior communicating artery. No left posterior communicating artery identified, likely hypoplastic or absent. Review of the MIP images confirms the above findings IMPRESSION: CT head: 1. No acute intracranial abnormality identified. 2. ASPECTS is 10. CTA neck: Carotid and vertebral arteries are patent. No dissection, aneurysm, or significant stenosis is identified. CTA head: Patent circle of Willis. No large vessel occlusion, aneurysm, or significant stenosis is identified. These results were called by telephone at the time of interpretation on 01/19/2017 at 7:50 pm to Dr. Marily Memos, who verbally acknowledged these results.  Electronically Signed   By: Mitzi Hansen M.D.   On: 01/19/2017 19:53   Mr Brain Wo Contrast  Result Date: 01/19/2017 CLINICAL DATA:  36 y/o M; altered mental status, evaluate for stroke. EXAM: MRI HEAD WITHOUT CONTRAST TECHNIQUE: Multiplanar, multiecho pulse sequences of the brain and surrounding structures were obtained without intravenous contrast. COMPARISON:  None. FINDINGS: Brain: No acute infarction, hemorrhage, hydrocephalus, extra-axial collection or mass lesion. Few scattered nonspecific punctate foci of T2 FLAIR hyperintensity in subcortical white matter of unlikely clinical significance. Vascular: Normal flow voids. Skull and upper cervical spine: Normal marrow signal. Sinuses/Orbits: Mild maxillary sinus mucosal thickening. Otherwise negative. Other: None. IMPRESSION: No acute intracranial abnormality identified. Unremarkable MRI of the brain. Electronically Signed   By: Mitzi Hansen M.D.   On: 01/19/2017 22:04   Dg Chest Portable 1 View  Result Date: 01/19/2017 CLINICAL DATA:  Intubated, code stroke EXAM: PORTABLE CHEST 1 VIEW COMPARISON:  None. FINDINGS: Enteric tube enters stomach with the tip not seen on this image. Endotracheal tube tip is 5.0 cm above the carina. Normal mediastinal contour. No pneumothorax. No pleural effusion. Mild bibasilar scarring versus atelectasis. No pulmonary  edema. IMPRESSION: 1. Endotracheal tube tip 5.0 cm above the carina. 2.  Enteric tube enters stomach with the tip not seen on this image. 3. Mild bibasilar scarring versus atelectasis. Electronically Signed   By: Delbert Phenix M.D.   On: 01/19/2017 19:54   Ct Head Code Stroke W/o Cm  Result Date: 01/19/2017 CLINICAL DATA:  37 y/o  M; code stroke. EXAM: CT HEAD WITHOUT CONTRAST CT ANGIOGRAPHY HEAD AND NECK TECHNIQUE: Multidetector CT imaging of the head and neck was performed using the standard protocol during bolus administration of intravenous contrast. Multiplanar CT image  reconstructions and MIPs were obtained to evaluate the vascular anatomy. Carotid stenosis measurements (when applicable) are obtained utilizing NASCET criteria, using the distal internal carotid diameter as the denominator. CONTRAST:  50 cc Isovue 370 COMPARISON:  None. FINDINGS: CT HEAD FINDINGS Brain: No evidence of acute infarction, hemorrhage, hydrocephalus, extra-axial collection or mass lesion/mass effect. ASPECTS is 10. Vascular: As below. Skull: Normal. Negative for fracture or focal lesion. Sinuses: Imaged portions are clear. Orbits: No acute finding. Review of the MIP images confirms the above findings CTA NECK FINDINGS Aortic arch: Standard branching. Imaged portion shows no evidence of aneurysm or dissection. No significant stenosis of the major arch vessel origins. Right carotid system: No evidence of dissection, stenosis (50% or greater) or occlusion. Left carotid system: No evidence of dissection, stenosis (50% or greater) or occlusion. Vertebral arteries: Right dominant. No evidence of dissection, stenosis (50% or greater) or occlusion. Skeleton: No significant cervical degenerative changes. No high-grade bony canal stenosis. No acute osseous abnormality. Other neck: Negative. Upper chest: Endotracheal tube and nasoenteric tubes noted. Review of the MIP images confirms the above findings CTA HEAD FINDINGS Anterior circulation: No significant stenosis, proximal occlusion, aneurysm, or vascular malformation. Posterior circulation: No significant stenosis, proximal occlusion, aneurysm, or vascular malformation. Venous sinuses: As permitted by contrast timing, patent. Anatomic variants: Patent anterior communicating artery and right posterior communicating artery. No left posterior communicating artery identified, likely hypoplastic or absent. Review of the MIP images confirms the above findings IMPRESSION: CT head: 1. No acute intracranial abnormality identified. 2. ASPECTS is 10. CTA neck: Carotid and  vertebral arteries are patent. No dissection, aneurysm, or significant stenosis is identified. CTA head: Patent circle of Willis. No large vessel occlusion, aneurysm, or significant stenosis is identified. These results were called by telephone at the time of interpretation on 01/19/2017 at 7:50 pm to Dr. Marily Memos, who verbally acknowledged these results. Electronically Signed   By: Mitzi Hansen M.D.   On: 01/19/2017 19:53    Assessment: 36 year old male with AMS. Based upon overall history, exam and imaging findings, drug intoxication with a sympathomimetic versus serotonin syndrome are highest on the DDx. No LVO on CT head and no restricted diffusion is seen on MRI. Presentation not consistent with opiate intoxication. No history of adventitious movements to suggest seizures. Infection is possible but low on the DDx given no nuchal rigidity and mild fever that has subsided.  Recommendations: 1. Supportive care with IVF, BP management and temperature control. 2. EEG. 3. Toxicology consult. 4. Toxicology screen. 5. Comprehensive metabolic panel 6. Consider LP 7. Neurology service will continue to follow with you.   45 minutes spent in the emergent Neurological evaluation and management of this critically ill patient.  Electronically signed: Dr. Caryl Pina 01/19/2017, 10:49 PM

## 2017-01-19 NOTE — Procedures (Signed)
Intubation Procedure Note Darren Mason [id number] August 20, 1980  Procedure: Intubation Indications: Airway protection and maintenance  Procedure Details Consent: Unable to obtain consent because of altered level of consciousness. Time Out: Verified patient identification, verified procedure, site/side was marked, verified correct patient position, special equipment/implants available, medications/allergies/relevent history reviewed, required imaging and test results available.  Performed  Maximum sterile technique was used including gloves, gown, hand hygiene and mask.  MAC and 4    Evaluation Hemodynamic Status: BP stable throughout; O2 sats: stable throughout Patient's Current Condition: stable Complications: No apparent complications Patient did tolerate procedure well. Chest X-ray ordered to verify placement.  CXR: pending.   Darren Mason 01/19/2017

## 2017-01-19 NOTE — ED Notes (Signed)
Pt brought directly to Trauma B on NRB and GCS of 3 with snoring respirations

## 2017-01-19 NOTE — ED Notes (Signed)
Patient transported to MRI with nurse and RT.

## 2017-01-19 NOTE — ED Notes (Signed)
Code stroke cancelled . Admission for CCM.

## 2017-01-19 NOTE — ED Notes (Signed)
Pt transported to CT scan.

## 2017-01-19 NOTE — Code Documentation (Addendum)
PT arrived by [facility], called out by family, pt was LSN at 1530 and then found to be a little lethargic.  By time EMS got there he was crawling on the floor and saying incomprehensible words.  Pt arrived with pupils size 7/dilated and he had snoring respirations.  Pt was GCS of 3 on arrival.  Pt had 1 liter in PTA.  Family on scene said he takes hydrocodone and possibly K2.  Pt received Versed mg en route to ED.  Snoring respirations on arrival and went to intubate

## 2017-01-19 NOTE — ED Provider Notes (Signed)
I saw and evaluated the patient, reviewed the resident's note and I agree with the findings and plan with the following exceptions.    I discussed with Poison control who felt that the patient most likely was exhibiting s/s of serotonin syndrome. Recommended supportive care at this time. Repeat ECG's q4h. If prolonged qt, give Mg/K. If prolonged QRS, give bicarb. Also recommended activated charcoal for the wellbutrin. Supportive care otherwise.   I was present for and directly supervised the intubation.   CRITICAL CARE Performed by: Marily MemosMesner, Lun Muro Total critical care time: 45 minutes Critical care time was exclusive of separately billable procedures and treating other patients. Critical care was necessary to treat or prevent imminent or life-threatening deterioration. Critical care was time spent personally by me on the following activities: development of treatment plan with patient and/or surrogate as well as nursing, discussions with consultants, evaluation of patient's response to treatment, examination of patient, obtaining history from patient or surrogate, ordering and performing treatments and interventions, ordering and review of laboratory studies, ordering and review of radiographic studies, pulse oximetry and re-evaluation of patient's condition.    EKG Interpretation  Date/Time:  Monday January 19 2017 19:38:31 EDT Ventricular Rate:  123 PR Interval:    QRS Duration: 97 QT Interval:  317 QTC Calculation: 454 R Axis:   55 Text Interpretation:  Sinus tachycardia Probable anteroseptal infarct, old Confirmed by Marily MemosMesner, Kanaya Gunnarson [phone]([id number]) on 01/19/2017 11:08:42 PM         Brihany Butch, Barbara CowerJason, MD 01/19/17 2352

## 2017-01-19 NOTE — ED Triage Notes (Signed)
See notes

## 2017-01-19 NOTE — ED Notes (Signed)
Admitting MD Dr. Merlene PullingHammonds at bedside evaluating pt , family at bedside.

## 2017-01-19 NOTE — ED Notes (Signed)
Family at bedside. 

## 2017-01-19 NOTE — ED Provider Notes (Signed)
[facility]-EMERGENCY DEPT Provider Note   CSN: [id number] Arrival date & time: 01/19/17  4098     History   Chief Complaint Chief Complaint  Patient presents with  . Altered Mental Status    HPI Darren Mason is a 36 y.o. male with history of anxiety and depression, chronic back pain who is coming in today with altered mental status. Per EMS, patient around 3 PM began having altered mental status and parents noted he was very agitated. Pill bottles were found at the home. Patient does take K2, a form of spice, Soma, Lexapro, Percocet, Wellbutrin, Vyvanse, Voltaren, and was recently prescribed doxycycline with concern for tick bite. Patient minimally responsive for EMS. He was placed on a nonrebreather and transported here with the initial call with concern for stroke.  HPI  No past medical history on file.  Patient Active Problem List   Diagnosis Date Noted  . Altered mental state 01/19/2017  . Ingrown toenail 05/24/2016  . Testosterone deficiency 05/24/2016  . Anxiety and depression 05/24/2016  . Osteoarthritis 05/24/2016  . Chronic back pain 05/24/2016    No past surgical history on file.     Home Medications    Prior to Admission medications   Medication Sig Start Date End Date Taking? Authorizing Provider  buPROPion (WELLBUTRIN XL) 300 MG 24 hr tablet Take 300 mg by mouth daily.  05/16/16   [provider]  carisoprodol (SOMA) 350 MG tablet Take 350 mg by mouth 3 (three) times daily.  05/21/16   [provider]  diclofenac (VOLTAREN) 75 MG EC tablet Take 75 mg by mouth 2 (two) times daily.  05/03/16   [provider]  escitalopram (LEXAPRO) 10 MG tablet Take 10 mg by mouth daily.  05/16/16   [provider]  HYDROcodone-acetaminophen (NORCO) 10-325 MG tablet Take 2 tablets by mouth every 6 (six) hours as needed.  05/08/16   [provider]  predniSONE (DELTASONE) 20 MG tablet Take 2 tablet by mouth daily for 5 days. 05/23/16    Doreene Nest, NP  testosterone cypionate (DEPOTESTOSTERONE CYPIONATE) 200 MG/ML injection Inject 100 mg into the muscle. Every 10 days 03/24/16   [provider]    Family History No family history on file.  Social History Social History  Substance Use Topics  . Smoking status: Never Smoker  . Smokeless tobacco: Not on file  . Alcohol use Yes     Allergies   Patient has no known allergies.   Review of Systems Review of Systems  Unable to perform ROS: Intubated     Physical Exam Updated Vital Signs BP 123/63   Pulse 85   Temp (!) 97.5 F (36.4 C)   Resp 18   Ht 6' (1.829 m)   Wt 100.2 kg (220 lb 14.4 oz)   SpO2 98%   BMI 29.96 kg/m   Physical Exam  Constitutional: He appears well-developed and well-nourished. He appears distressed.  HENT:  Head: Normocephalic and atraumatic.  Mouth/Throat: Oropharynx is clear and moist.  Eyes: Conjunctivae are normal.  Mydriatic pupils nonreactive bilaterally  Neck: No tracheal deviation present.  Cardiovascular: Intact distal pulses.   Tachycardia  Pulmonary/Chest: He is in respiratory distress. He has no wheezes.  Ventilated breath sounds  Abdominal: Soft. He exhibits no distension.  Musculoskeletal: He exhibits no deformity.  Neurological:  GCS 3T  Skin: Skin is warm and dry.  Psychiatric:  Unable to assess     ED Treatments / Results  Labs (all labs ordered  are listed, but only abnormal results are displayed) Labs Reviewed  APTT - Abnormal; Notable for the following:       Result Value   aPTT 23 (*)    All other components within normal limits  CBC - Abnormal; Notable for the following:    WBC 12.6 (*)    All other components within normal limits  DIFFERENTIAL - Abnormal; Notable for the following:    Neutro Abs 9.1 (*)    All other components within normal limits  COMPREHENSIVE METABOLIC PANEL - Abnormal; Notable for the following:    Glucose, Bld 135 (*)    Creatinine, Ser 1.34 (*)     Calcium 8.4 (*)    All other components within normal limits  RAPID URINE DRUG SCREEN, HOSP PERFORMED - Abnormal; Notable for the following:    Benzodiazepines POSITIVE (*)    Amphetamines POSITIVE (*)    All other components within normal limits  TRIGLYCERIDES - Abnormal; Notable for the following:    Triglycerides 266 (*)    All other components within normal limits  ACETAMINOPHEN LEVEL - Abnormal; Notable for the following:    Acetaminophen (Tylenol), Serum <10 (*)    All other components within normal limits  I-STAT CHEM 8, ED - Abnormal; Notable for the following:    BUN 23 (*)    Creatinine, Ser 1.30 (*)    Glucose, Bld 135 (*)    Calcium, Ion 1.00 (*)    All other components within normal limits  I-STAT ARTERIAL BLOOD GAS, ED - Abnormal; Notable for the following:    pH, Arterial 7.241 (*)    pCO2 arterial 66.3 (*)    pO2, Arterial 168.0 (*)    Bicarbonate 28.1 (*)    All other components within normal limits  ETHANOL  PROTIME-INR  URINALYSIS, ROUTINE W REFLEX MICROSCOPIC  CK  SALICYLATE LEVEL  HIV ANTIBODY (ROUTINE TESTING)  CBC  BASIC METABOLIC PANEL  BLOOD GAS, ARTERIAL  MAGNESIUM  PHOSPHORUS  I-STAT TROPOININ, ED  I-STAT ARTERIAL BLOOD GAS, ED    EKG  EKG Interpretation  Date/Time:  Monday January 19 2017 19:38:31 EDT Ventricular Rate:  123 PR Interval:    QRS Duration: 97 QT Interval:  317 QTC Calculation: 454 R Axis:   55 Text Interpretation:  Sinus tachycardia Probable anteroseptal infarct, old Confirmed by Marily Memos [phone]) on 01/19/2017 11:08:42 PM       Radiology Ct Angio Head W Or Wo Contrast  Result Date: 01/19/2017 CLINICAL DATA:  36 y/o  M; code stroke. EXAM: CT HEAD WITHOUT CONTRAST CT ANGIOGRAPHY HEAD AND NECK TECHNIQUE: Multidetector CT imaging of the head and neck was performed using the standard protocol during bolus administration of intravenous contrast. Multiplanar CT image reconstructions and MIPs were obtained to evaluate the  vascular anatomy. Carotid stenosis measurements (when applicable) are obtained utilizing NASCET criteria, using the distal internal carotid diameter as the denominator. CONTRAST:  50 cc Isovue 370 COMPARISON:  None. FINDINGS: CT HEAD FINDINGS Brain: No evidence of acute infarction, hemorrhage, hydrocephalus, extra-axial collection or mass lesion/mass effect. ASPECTS is 10. Vascular: As below. Skull: Normal. Negative for fracture or focal lesion. Sinuses: Imaged portions are clear. Orbits: No acute finding. Review of the MIP images confirms the above findings CTA NECK FINDINGS Aortic arch: Standard branching. Imaged portion shows no evidence of aneurysm or dissection. No significant stenosis of the major arch vessel origins. Right carotid system: No evidence of dissection, stenosis (50% or greater) or occlusion. Left carotid system: No evidence  of dissection, stenosis (50% or greater) or occlusion. Vertebral arteries: Right dominant. No evidence of dissection, stenosis (50% or greater) or occlusion. Skeleton: No significant cervical degenerative changes. No high-grade bony canal stenosis. No acute osseous abnormality. Other neck: Negative. Upper chest: Endotracheal tube and nasoenteric tubes noted. Review of the MIP images confirms the above findings CTA HEAD FINDINGS Anterior circulation: No significant stenosis, proximal occlusion, aneurysm, or vascular malformation. Posterior circulation: No significant stenosis, proximal occlusion, aneurysm, or vascular malformation. Venous sinuses: As permitted by contrast timing, patent. Anatomic variants: Patent anterior communicating artery and right posterior communicating artery. No left posterior communicating artery identified, likely hypoplastic or absent. Review of the MIP images confirms the above findings IMPRESSION: CT head: 1. No acute intracranial abnormality identified. 2. ASPECTS is 10. CTA neck: Carotid and vertebral arteries are patent. No dissection, aneurysm,  or significant stenosis is identified. CTA head: Patent circle of Willis. No large vessel occlusion, aneurysm, or significant stenosis is identified. These results were called by telephone at the time of interpretation on 01/19/2017 at 7:50 pm to Dr. Marily Memos, who verbally acknowledged these results. Electronically Signed   By: Mitzi Hansen M.D.   On: 01/19/2017 19:53   Ct Angio Neck W Or Wo Contrast  Result Date: 01/19/2017 CLINICAL DATA:  36 y/o  M; code stroke. EXAM: CT HEAD WITHOUT CONTRAST CT ANGIOGRAPHY HEAD AND NECK TECHNIQUE: Multidetector CT imaging of the head and neck was performed using the standard protocol during bolus administration of intravenous contrast. Multiplanar CT image reconstructions and MIPs were obtained to evaluate the vascular anatomy. Carotid stenosis measurements (when applicable) are obtained utilizing NASCET criteria, using the distal internal carotid diameter as the denominator. CONTRAST:  50 cc Isovue 370 COMPARISON:  None. FINDINGS: CT HEAD FINDINGS Brain: No evidence of acute infarction, hemorrhage, hydrocephalus, extra-axial collection or mass lesion/mass effect. ASPECTS is 10. Vascular: As below. Skull: Normal. Negative for fracture or focal lesion. Sinuses: Imaged portions are clear. Orbits: No acute finding. Review of the MIP images confirms the above findings CTA NECK FINDINGS Aortic arch: Standard branching. Imaged portion shows no evidence of aneurysm or dissection. No significant stenosis of the major arch vessel origins. Right carotid system: No evidence of dissection, stenosis (50% or greater) or occlusion. Left carotid system: No evidence of dissection, stenosis (50% or greater) or occlusion. Vertebral arteries: Right dominant. No evidence of dissection, stenosis (50% or greater) or occlusion. Skeleton: No significant cervical degenerative changes. No high-grade bony canal stenosis. No acute osseous abnormality. Other neck: Negative. Upper chest:  Endotracheal tube and nasoenteric tubes noted. Review of the MIP images confirms the above findings CTA HEAD FINDINGS Anterior circulation: No significant stenosis, proximal occlusion, aneurysm, or vascular malformation. Posterior circulation: No significant stenosis, proximal occlusion, aneurysm, or vascular malformation. Venous sinuses: As permitted by contrast timing, patent. Anatomic variants: Patent anterior communicating artery and right posterior communicating artery. No left posterior communicating artery identified, likely hypoplastic or absent. Review of the MIP images confirms the above findings IMPRESSION: CT head: 1. No acute intracranial abnormality identified. 2. ASPECTS is 10. CTA neck: Carotid and vertebral arteries are patent. No dissection, aneurysm, or significant stenosis is identified. CTA head: Patent circle of Willis. No large vessel occlusion, aneurysm, or significant stenosis is identified. These results were called by telephone at the time of interpretation on 01/19/2017 at 7:50 pm to Dr. Marily Memos, who verbally acknowledged these results. Electronically Signed   By: Mitzi Hansen M.D.   On: 01/19/2017 19:53   Mr  Brain Wo Contrast  Result Date: 01/19/2017 CLINICAL DATA:  36 y/o M; altered mental status, evaluate for stroke. EXAM: MRI HEAD WITHOUT CONTRAST TECHNIQUE: Multiplanar, multiecho pulse sequences of the brain and surrounding structures were obtained without intravenous contrast. COMPARISON:  None. FINDINGS: Brain: No acute infarction, hemorrhage, hydrocephalus, extra-axial collection or mass lesion. Few scattered nonspecific punctate foci of T2 FLAIR hyperintensity in subcortical white matter of unlikely clinical significance. Vascular: Normal flow voids. Skull and upper cervical spine: Normal marrow signal. Sinuses/Orbits: Mild maxillary sinus mucosal thickening. Otherwise negative. Other: None. IMPRESSION: No acute intracranial abnormality identified.  Unremarkable MRI of the brain. Electronically Signed   By: Mitzi HansenLance  Furusawa-Stratton M.D.   On: 01/19/2017 22:04   Dg Chest Portable 1 View  Result Date: 01/19/2017 CLINICAL DATA:  Intubated, code stroke EXAM: PORTABLE CHEST 1 VIEW COMPARISON:  None. FINDINGS: Enteric tube enters stomach with the tip not seen on this image. Endotracheal tube tip is 5.0 cm above the carina. Normal mediastinal contour. No pneumothorax. No pleural effusion. Mild bibasilar scarring versus atelectasis. No pulmonary edema. IMPRESSION: 1. Endotracheal tube tip 5.0 cm above the carina. 2.  Enteric tube enters stomach with the tip not seen on this image. 3. Mild bibasilar scarring versus atelectasis. Electronically Signed   By: Delbert PhenixJason A Poff M.D.   On: 01/19/2017 19:54   Ct Head Code Stroke W/o Cm  Result Date: 01/19/2017 CLINICAL DATA:  36 y/o  M; code stroke. EXAM: CT HEAD WITHOUT CONTRAST CT ANGIOGRAPHY HEAD AND NECK TECHNIQUE: Multidetector CT imaging of the head and neck was performed using the standard protocol during bolus administration of intravenous contrast. Multiplanar CT image reconstructions and MIPs were obtained to evaluate the vascular anatomy. Carotid stenosis measurements (when applicable) are obtained utilizing NASCET criteria, using the distal internal carotid diameter as the denominator. CONTRAST:  50 cc Isovue 370 COMPARISON:  None. FINDINGS: CT HEAD FINDINGS Brain: No evidence of acute infarction, hemorrhage, hydrocephalus, extra-axial collection or mass lesion/mass effect. ASPECTS is 10. Vascular: As below. Skull: Normal. Negative for fracture or focal lesion. Sinuses: Imaged portions are clear. Orbits: No acute finding. Review of the MIP images confirms the above findings CTA NECK FINDINGS Aortic arch: Standard branching. Imaged portion shows no evidence of aneurysm or dissection. No significant stenosis of the major arch vessel origins. Right carotid system: No evidence of dissection, stenosis (50% or  greater) or occlusion. Left carotid system: No evidence of dissection, stenosis (50% or greater) or occlusion. Vertebral arteries: Right dominant. No evidence of dissection, stenosis (50% or greater) or occlusion. Skeleton: No significant cervical degenerative changes. No high-grade bony canal stenosis. No acute osseous abnormality. Other neck: Negative. Upper chest: Endotracheal tube and nasoenteric tubes noted. Review of the MIP images confirms the above findings CTA HEAD FINDINGS Anterior circulation: No significant stenosis, proximal occlusion, aneurysm, or vascular malformation. Posterior circulation: No significant stenosis, proximal occlusion, aneurysm, or vascular malformation. Venous sinuses: As permitted by contrast timing, patent. Anatomic variants: Patent anterior communicating artery and right posterior communicating artery. No left posterior communicating artery identified, likely hypoplastic or absent. Review of the MIP images confirms the above findings IMPRESSION: CT head: 1. No acute intracranial abnormality identified. 2. ASPECTS is 10. CTA neck: Carotid and vertebral arteries are patent. No dissection, aneurysm, or significant stenosis is identified. CTA head: Patent circle of Willis. No large vessel occlusion, aneurysm, or significant stenosis is identified. These results were called by telephone at the time of interpretation on 01/19/2017 at 7:50 pm to Dr. Marily MemosJASON MESNER, who  verbally acknowledged these results. Electronically Signed   By: Mitzi Hansen M.D.   On: 01/19/2017 19:53    Procedures Procedure Name: Intubation Date/Time: 01/19/2017 11:38 PM Performed by: Orson Slick Pre-anesthesia Checklist: Patient identified, Emergency Drugs available, Suction available and Patient being monitored Oxygen Delivery Method: Ambu bag Preoxygenation: Pre-oxygenation with 100% oxygen Induction Type: Rapid sequence Ventilation: Mask ventilation without difficulty and Two handed mask  ventilation required Laryngoscope Size: Glidescope and 4 Grade View: Grade I Tube size: 7.5 mm Number of attempts: 1 Airway Equipment and Method: Video-laryngoscopy and Rigid stylet Placement Confirmation: ETT inserted through vocal cords under direct vision,  Breath sounds checked- equal and bilateral and Positive ETCO2 Secured at: 24 cm Tube secured with: ETT holder      (including critical care time)  Medications Ordered in ED Medications  propofol (DIPRIVAN) 1000 MG/100ML infusion (5 mcg/min Intravenous New Bag/Given 01/19/17 1915)  midazolam (VERSED) injection 2 mg (2 mg Intravenous Given 01/19/17 2019)  midazolam (VERSED) injection 2 mg (2 mg Intravenous Given 01/19/17 2148)  propofol (DIPRIVAN) 1000 MG/100ML infusion (not administered)  heparin injection 5,000 Units (not administered)  0.9 %  sodium chloride infusion (not administered)  pantoprazole (PROTONIX) injection 40 mg (not administered)  0.9 %  sodium chloride infusion (not administered)  activated charcoal-sorbitol (ACTIDOSE-SORBITOL) suspension 100 g (not administered)  0.9 %  sodium chloride infusion (1,000 mLs Intravenous New Bag/Given 01/19/17 1901)  etomidate (AMIDATE) injection (30 mg Intravenous Given 01/19/17 1902)  rocuronium (ZEMURON) injection (100 mg Intravenous Given 01/19/17 1903)  iopamidol (ISOVUE-370) 76 % injection 50 mL (50 mLs Intravenous Contrast Given 01/19/17 1936)     Initial Impression / Assessment and Plan / ED Course  I have reviewed the triage vital signs and the nursing notes.  Pertinent labs & imaging results that were available during my care of the patient were reviewed by me and considered in my medical decision making (see chart for details).     Patient presented with altered mental status. Initially seen with concern for stroke. With low GCS and desaturation of oxygen levels, patient intubated for airway protection, And sedation was maintained with propofol. Patient then went to CT  scanner and had negative scans. Concern for him Having pill bottles as well, unsure if suicide attempt. CT and MRI negative for acute abnormality. After returning from scanner, patient somewhat following commands however still agitated and required continued propofol administration for sedation while being intubated.  Labs remarkable for Positive benzodiazepines, positive amphetamines. ABG shows pH 7.241, PCO2 66, bicarbonate 28. Repeat improved to pH 7.42, PCO2 39, bicarbonate 25. CK within normal limits, negative salicylate and negative Tylenol level. Acute kidney injury noted with serum creatinine of 1.3. Mild leukocytosis. Considering history of multiple different serotonergic medications, unsure if this is serotonin syndrome/ingestion of some sort versus possible encephalitis secondary to a tick bite versus illicit drug ingestion. Patient will be admitted to the critical care team in the ICU for further evaluation and management and was stable while under my care in the emergency department. Upon recheck at 11:30, vital signs have improved to heart rate in the 80s with normal blood pressure.  Patient was seen with my attending, Dr. Clayborne Dana, who voiced agreement and oversaw the evaluation and treatment of this patient.   Dr[employer] illustrator was used in the creation of this note. If there are any errors or inconsistencies needing clarification, please contact me directly.   Final Clinical Impressions(s) / ED Diagnoses   Final diagnoses:  Altered  mental status, unspecified altered mental status type  Acute kidney injury [facility])    New Prescriptions New Prescriptions   No medications on file     Orson Slick, MD 01/19/17 2341    Marily Memos, MD 01/19/17 [phone]

## 2017-01-20 ENCOUNTER — Inpatient Hospital Stay (HOSPITAL_COMMUNITY): Payer: 59

## 2017-01-20 ENCOUNTER — Other Ambulatory Visit (HOSPITAL_COMMUNITY): Payer: 59

## 2017-01-20 DIAGNOSIS — J9601 Acute respiratory failure with hypoxia: Secondary | ICD-10-CM

## 2017-01-20 DIAGNOSIS — G2579 Other drug induced movement disorders: Secondary | ICD-10-CM

## 2017-01-20 LAB — CBC
HEMATOCRIT: 42 % (ref 39.0–52.0)
HEMOGLOBIN: 13.9 g/dL (ref 13.0–17.0)
MCH: 30 pg (ref 26.0–34.0)
MCHC: 33.1 g/dL (ref 30.0–36.0)
MCV: 90.5 fL (ref 78.0–100.0)
Platelets: 201 10*3/uL (ref 150–400)
RBC: 4.64 MIL/uL (ref 4.22–5.81)
RDW: 13 % (ref 11.5–15.5)
WBC: 10.5 10*3/uL (ref 4.0–10.5)

## 2017-01-20 LAB — ECHOCARDIOGRAM COMPLETE
Height: 75 in
WEIGHTICAEL: 3537.94 [oz_av]

## 2017-01-20 LAB — BLOOD GAS, ARTERIAL
ACID-BASE EXCESS: 0.7 mmol/L (ref 0.0–2.0)
BICARBONATE: 25 mmol/L (ref 20.0–28.0)
Drawn by: 330991
FIO2: 0.4
MECHVT: 620 mL
O2 SAT: 95.6 %
PATIENT TEMPERATURE: 100.4
PEEP: 5 cmH2O
PO2 ART: 88.9 mmHg (ref 83.0–108.0)
RATE: 18 resp/min
pCO2 arterial: 43.8 mmHg (ref 32.0–48.0)
pH, Arterial: 7.38 (ref 7.350–7.450)

## 2017-01-20 LAB — GLUCOSE, CAPILLARY
Glucose-Capillary: 106 mg/dL — ABNORMAL HIGH (ref 65–99)
Glucose-Capillary: 86 mg/dL (ref 65–99)
Glucose-Capillary: 96 mg/dL (ref 65–99)
Glucose-Capillary: 97 mg/dL (ref 65–99)

## 2017-01-20 LAB — BASIC METABOLIC PANEL
Anion gap: 11 (ref 5–15)
BUN: 15 mg/dL (ref 6–20)
CHLORIDE: 102 mmol/L (ref 101–111)
CO2: 24 mmol/L (ref 22–32)
CREATININE: 1.3 mg/dL — AB (ref 0.61–1.24)
Calcium: 8.7 mg/dL — ABNORMAL LOW (ref 8.9–10.3)
GFR calc non Af Amer: >60 mL/min (ref >60)
Glucose, Bld: 103 mg/dL — ABNORMAL HIGH (ref 65–99)
POTASSIUM: 3.6 mmol/L (ref 3.5–5.1)
Sodium: 137 mmol/L (ref 135–145)

## 2017-01-20 LAB — PHOSPHORUS: PHOSPHORUS: 5.4 mg/dL — AB (ref 2.5–4.6)

## 2017-01-20 LAB — MAGNESIUM: MAGNESIUM: 2 mg/dL (ref 1.7–2.4)

## 2017-01-20 LAB — MRSA PCR SCREENING: MRSA by PCR: INVALID — AB

## 2017-01-20 MED ORDER — FENTANYL CITRATE (PF) 100 MCG/2ML IJ SOLN: 25.0000 ug | INTRAMUSCULAR | Status: DC | PRN

## 2017-01-20 MED ORDER — OXYCODONE-ACETAMINOPHEN 5-325 MG PO TABS
2.0000 | ORAL_TABLET | Freq: Four times a day (QID) | ORAL | Status: DC | PRN
  Administered 2017-01-20 – 2017-01-21 (×4): 2 via ORAL
  Filled 2017-01-20 (×5): qty 2

## 2017-01-20 MED ORDER — ACETAMINOPHEN 160 MG/5ML PO SOLN: 650.0000 mg | Freq: Four times a day (QID) | ORAL | Status: DC | PRN

## 2017-01-20 MED ORDER — ORAL CARE MOUTH RINSE
15.0000 mL | Freq: Four times a day (QID) | OROMUCOSAL | Status: DC
  Administered 2017-01-20: 15 mL via OROMUCOSAL

## 2017-01-20 MED ORDER — CARISOPRODOL 350 MG PO TABS
350.0000 mg | ORAL_TABLET | Freq: Two times a day (BID) | ORAL | Status: DC | PRN
  Administered 2017-01-20 – 2017-01-21 (×3): 350 mg via ORAL
  Filled 2017-01-20 (×3): qty 1

## 2017-01-20 MED ORDER — SODIUM CHLORIDE 0.9 % IV SOLN
0.4000 ug/kg/h | INTRAVENOUS | Status: DC
  Administered 2017-01-20: 0.4 ug/kg/h via INTRAVENOUS
  Administered 2017-01-20: 0.7 ug/kg/h via INTRAVENOUS
  Filled 2017-01-20 (×2): qty 2

## 2017-01-20 MED ORDER — CHLORHEXIDINE GLUCONATE 0.12% ORAL RINSE (MEDLINE KIT)
15.0000 mL | Freq: Two times a day (BID) | OROMUCOSAL | Status: DC
  Administered 2017-01-20 (×3): 15 mL via OROMUCOSAL

## 2017-01-20 NOTE — Evaluation (Signed)
Clinical/Bedside Swallow Evaluation Patient Details  Name: Darren Mason MRN: [id number] Date of Birth: 02/19/1981  Today's Date: 01/20/2017 Time: SLP Start Time (ACUTE ONLY): 0940 SLP Stop Time (ACUTE ONLY): 1010 SLP Time Calculation (min) (ACUTE ONLY): 30 min  Past Medical History: No past medical history on file. Past Surgical History: No past surgical history on file. HPI:  Darren Mason is an 36 y.o. male who was brought to the ED by EMS after being found combative and nonverbal with altered mentation at home- medication bottles found in house Soma and Oxycodone/Tylenol prescription bottles.  GCS of 3 with snoring respirations were concerning for imminent respiratory decompensation and he was intubated.  Pt PMH + for chronic back pain and pt was scheduled for MRI of back today.  Pt extubated today and swallow evaluation ordered.  Pt admits to recent episode with dizziness, etc and received antibiotic from Urgent Care approximately one week ago = also recent tick bite.  CXR and MRI brain negative.      Assessment / Plan / Recommendation Clinical Impression  Pt presents with baseline cough/wet voice - and was extubated approximately one hour prior.  Wife reports bloodly secretions being coughed up by pt after extubation and pt with some agitation/thrashing about with tube in place. Suspect his may contribute to pharyngeal edema and pt reports throat pain -  Baseline wet voice/cough/throat clearing noted that did not worsen with po intake - however can not rule out silent aspiration.  Vocal quality improved with intake - suspect secretion clearance.   Given pt's young age, negative neuro involvement and no premorbid dysphagia - consideration for clear liquid diet with strict precautions may be indicated.  Educated pt to findings/recommendations using teach back and clinical reasoning.    Options - clear liquids vs single ice chips today and reassess next date.   Will follow up for po tolerance and  dietary advancement indication.    Thanks for this consult.  SLP Visit Diagnosis: Dysphagia, pharyngeal phase (R13.13)    Aspiration Risk  Mild aspiration risk    Diet Recommendation  (ice chips vs clears)   Liquid Administration via: Cup;Straw Supervision: Patient able to self feed Compensations: Slow rate;Small sips/bites Postural Changes: Seated upright at 90 degrees;Remain upright for at least 30 minutes after po intake    Other  Recommendations Oral Care Recommendations: Oral care BID   Follow up Recommendations        Frequency and Duration min 1 x/week  1 week       Prognosis Prognosis for Safe Diet Advancement: Fair      Swallow Study   General Date of Onset: 01/20/17 HPI: Darren Mason is an 36 y.o. male who was brought to the ED by EMS after being found combative and nonverbal with altered mentation at home- medication bottles found in house Soma and Oxycodone/Tylenol prescription bottles.  GCS of 3 with snoring respirations were concerning for imminent respiratory decompensation and he was intubated.  Pt PMH + for chronic back pain and pt was scheduled for MRI of back today.  Pt extubated today and swallow evaluation ordered.  Pt admits to recent episode with dizziness, etc and received antibiotic from Urgent Care approximately one week ago = also recent tick bite.  CXR and MRI brain negative.    Type of Study: Bedside Swallow Evaluation Diet Prior to this Study: NPO Temperature Spikes Noted: Yes Respiratory Status: Room air (pt was on nasal cannula after extubation) History of Recent Intubation: Yes Length of Intubations (  days): 1 days Date extubated: 01/20/17 Behavior/Cognition: Alert;Cooperative;Pleasant mood Oral Cavity Assessment: Within Functional Limits (posterior edema/redness) Oral Care Completed by SLP: No Oral Cavity - Dentition: Adequate natural dentition Vision: Functional for self-feeding Self-Feeding Abilities: Able to feed self Patient  Positioning: Upright in bed Baseline Vocal Quality: Hoarse;Low vocal intensity Volitional Cough: Strong Volitional Swallow: Able to elicit    Oral/Motor/Sensory Function Overall Oral Motor/Sensory Function: Within functional limits   Ice Chips Ice chips: Within functional limits Presentation: Spoon Pharyngeal Phase Impairments: Cough - Delayed;Throat Clearing - Immediate;Throat Clearing - Delayed;Wet Vocal Quality   Thin Liquid Thin Liquid: Impaired Presentation: Cup;Self Fed;Straw Pharyngeal  Phase Impairments: Cough - Immediate;Throat Clearing - Immediate;Wet Vocal Quality    Nectar Thick Nectar Thick Liquid: Not tested   Honey Thick Honey Thick Liquid: Not tested   Puree Puree: Impaired Presentation: Self Fed Pharyngeal Phase Impairments: Throat Clearing - Delayed   Solid   GO   Solid: Impaired Pharyngeal Phase Impairments: Throat Clearing - Delayed;Multiple swallows       Donavan Burnet, MS [facility] SLP [phone]  Chales Abrahams 01/20/2017,10:31 AM

## 2017-01-20 NOTE — Progress Notes (Signed)
PULMONARY / CRITICAL CARE MEDICINE   Name: Darren Mason MRN: [id number] DOB: 1980-10-26    ADMISSION DATE:  01/19/2017 CONSULTATION DATE:  01/19/17  REFERRING MD:  Dr. Damita Dunnings  CHIEF COMPLAINT:  AMS  HISTORY OF PRESENT ILLNESS:   36yo male with PMH anxiety, depression, chronic back pain and recent possible tick bite here with AMS. Was last seen in usual state of health around 3:30pm on day of presentation. He was found slumped over and mumbling around 5:15pm, trying to move his arms but having difficulty. He subsequently worsened and on EMS arrival was found to be crawling on floor and speaking incomprehensibly. Received Versed en route to ED. On arrival to ED was GCS 3, dilated pupils, snoring respirations and required intubation for airway protection.   SUBJECTIVE:  Overnight was initially hypothermic requiring bair hugger but then subsequently was febrile to 102Fmax, now normothermic. This morning very upset and agitated. Denies SI or ingestion of recreational substances or taking more of his prescribed medications than prescribed. Did take vyvanse the day of presentation.  VITAL SIGNS: BP 129/78   Pulse 79   Temp 99.5 F (37.5 C) (Core (Comment))   Resp (!) 24   Ht 6\' 3"  (1.905 m)   Wt 100.3 kg (221 lb 1.9 oz)   SpO2 98%   BMI 27.64 kg/m   HEMODYNAMICS:    VENTILATOR SETTINGS: Vent Mode: PSV;CPAP FiO2 (%):  [40 %-100 %] 40 % Set Rate:  [14 bmp-22 bmp] 18 bmp Vt Set:  [620 mL] 620 mL PEEP:  [5 cmH20] 5 cmH20 Pressure Support:  [5 cmH20] 5 cmH20 Plateau Pressure:  [16 cmH20-20 cmH20] 16 cmH20  INTAKE / OUTPUT: I/O last 3 completed shifts: In: 2389.3 [I.V.:2359.3; NG/GT:30] Out: 2660 [Urine:1760; Emesis/NG output:900]  PHYSICAL EXAMINATION: General:  Laying in bed, NAD  Neuro:  Awake, alert, following commands. No focal deficits HEENT:  PERRL, no mydriasis. MMM Cardiovascular:  RRR, no murmurs Lungs:  CTAB, normal effort  Abdomen:  Soft, nontender, nondistended, +  bowel sounds Musculoskeletal:  Normal bulk and tone, no LE edema  Skin:  Warm and dry, no rashes  LABS:  BMET  Recent Labs Lab 01/19/17 1856 01/19/17 1907 01/20/17 0226  NA 137 137 137  K 3.9 4.6 3.6  CL 103 102 102  CO2 23  --  24  BUN 15 23* 15  CREATININE 1.34* 1.30* 1.30*  GLUCOSE 135* 135* 103*    Electrolytes  Recent Labs Lab 01/19/17 1856 01/20/17 0226  CALCIUM 8.4* 8.7*  MG  --  2.0  PHOS  --  5.4*    CBC  Recent Labs Lab 01/19/17 1856 01/19/17 1907 01/20/17 0226  WBC 12.6*  --  10.5  HGB 14.8 15.3 13.9  HCT 44.2 45.0 42.0  PLT 213  --  201    Coag's  Recent Labs Lab 01/19/17 1856  APTT 23*  INR 1.04    Sepsis Markers No results for input(s): LATICACIDVEN, PROCALCITON, O2SATVEN in the last 168 hours.  ABG  Recent Labs Lab 01/19/17 1953 01/19/17 2148 01/20/17 0412  PHART 7.241* 7.420 7.380  PCO2ART 66.3* 39.1 43.8  PO2ART 168.0* 108.0 88.9    Liver Enzymes  Recent Labs Lab 01/19/17 1856  AST 30  ALT 56  ALKPHOS 52  BILITOT 0.5  ALBUMIN 3.9    Cardiac Enzymes No results for input(s): TROPONINI, PROBNP in the last 168 hours.  Glucose  Recent Labs Lab 01/20/17 0043 01/20/17 0320 01/20/17 0816  GLUCAP 96 97 106*  Imaging Ct Angio Head W Or Wo Contrast  Result Date: 01/19/2017 CLINICAL DATA:  36 y/o  M; code stroke. EXAM: CT HEAD WITHOUT CONTRAST CT ANGIOGRAPHY HEAD AND NECK TECHNIQUE: Multidetector CT imaging of the head and neck was performed using the standard protocol during bolus administration of intravenous contrast. Multiplanar CT image reconstructions and MIPs were obtained to evaluate the vascular anatomy. Carotid stenosis measurements (when applicable) are obtained utilizing NASCET criteria, using the distal internal carotid diameter as the denominator. CONTRAST:  50 cc Isovue 370 COMPARISON:  None. FINDINGS: CT HEAD FINDINGS Brain: No evidence of acute infarction, hemorrhage, hydrocephalus, extra-axial  collection or mass lesion/mass effect. ASPECTS is 10. Vascular: As below. Skull: Normal. Negative for fracture or focal lesion. Sinuses: Imaged portions are clear. Orbits: No acute finding. Review of the MIP images confirms the above findings CTA NECK FINDINGS Aortic arch: Standard branching. Imaged portion shows no evidence of aneurysm or dissection. No significant stenosis of the major arch vessel origins. Right carotid system: No evidence of dissection, stenosis (50% or greater) or occlusion. Left carotid system: No evidence of dissection, stenosis (50% or greater) or occlusion. Vertebral arteries: Right dominant. No evidence of dissection, stenosis (50% or greater) or occlusion. Skeleton: No significant cervical degenerative changes. No high-grade bony canal stenosis. No acute osseous abnormality. Other neck: Negative. Upper chest: Endotracheal tube and nasoenteric tubes noted. Review of the MIP images confirms the above findings CTA HEAD FINDINGS Anterior circulation: No significant stenosis, proximal occlusion, aneurysm, or vascular malformation. Posterior circulation: No significant stenosis, proximal occlusion, aneurysm, or vascular malformation. Venous sinuses: As permitted by contrast timing, patent. Anatomic variants: Patent anterior communicating artery and right posterior communicating artery. No left posterior communicating artery identified, likely hypoplastic or absent. Review of the MIP images confirms the above findings IMPRESSION: CT head: 1. No acute intracranial abnormality identified. 2. ASPECTS is 10. CTA neck: Carotid and vertebral arteries are patent. No dissection, aneurysm, or significant stenosis is identified. CTA head: Patent circle of Willis. No large vessel occlusion, aneurysm, or significant stenosis is identified. These results were called by telephone at the time of interpretation on 01/19/2017 at 7:50 pm to Dr. Marily Memos, who verbally acknowledged these results. Electronically  Signed   By: Mitzi Hansen M.D.   On: 01/19/2017 19:53   Ct Angio Neck W Or Wo Contrast  Result Date: 01/19/2017 CLINICAL DATA:  36 y/o  M; code stroke. EXAM: CT HEAD WITHOUT CONTRAST CT ANGIOGRAPHY HEAD AND NECK TECHNIQUE: Multidetector CT imaging of the head and neck was performed using the standard protocol during bolus administration of intravenous contrast. Multiplanar CT image reconstructions and MIPs were obtained to evaluate the vascular anatomy. Carotid stenosis measurements (when applicable) are obtained utilizing NASCET criteria, using the distal internal carotid diameter as the denominator. CONTRAST:  50 cc Isovue 370 COMPARISON:  None. FINDINGS: CT HEAD FINDINGS Brain: No evidence of acute infarction, hemorrhage, hydrocephalus, extra-axial collection or mass lesion/mass effect. ASPECTS is 10. Vascular: As below. Skull: Normal. Negative for fracture or focal lesion. Sinuses: Imaged portions are clear. Orbits: No acute finding. Review of the MIP images confirms the above findings CTA NECK FINDINGS Aortic arch: Standard branching. Imaged portion shows no evidence of aneurysm or dissection. No significant stenosis of the major arch vessel origins. Right carotid system: No evidence of dissection, stenosis (50% or greater) or occlusion. Left carotid system: No evidence of dissection, stenosis (50% or greater) or occlusion. Vertebral arteries: Right dominant. No evidence of dissection, stenosis (50% or greater) or  occlusion. Skeleton: No significant cervical degenerative changes. No high-grade bony canal stenosis. No acute osseous abnormality. Other neck: Negative. Upper chest: Endotracheal tube and nasoenteric tubes noted. Review of the MIP images confirms the above findings CTA HEAD FINDINGS Anterior circulation: No significant stenosis, proximal occlusion, aneurysm, or vascular malformation. Posterior circulation: No significant stenosis, proximal occlusion, aneurysm, or vascular  malformation. Venous sinuses: As permitted by contrast timing, patent. Anatomic variants: Patent anterior communicating artery and right posterior communicating artery. No left posterior communicating artery identified, likely hypoplastic or absent. Review of the MIP images confirms the above findings IMPRESSION: CT head: 1. No acute intracranial abnormality identified. 2. ASPECTS is 10. CTA neck: Carotid and vertebral arteries are patent. No dissection, aneurysm, or significant stenosis is identified. CTA head: Patent circle of Willis. No large vessel occlusion, aneurysm, or significant stenosis is identified. These results were called by telephone at the time of interpretation on 01/19/2017 at 7:50 pm to Dr. Marily Memos, who verbally acknowledged these results. Electronically Signed   By: Mitzi Hansen M.D.   On: 01/19/2017 19:53   Mr Brain Wo Contrast  Result Date: 01/19/2017 CLINICAL DATA:  36 y/o M; altered mental status, evaluate for stroke. EXAM: MRI HEAD WITHOUT CONTRAST TECHNIQUE: Multiplanar, multiecho pulse sequences of the brain and surrounding structures were obtained without intravenous contrast. COMPARISON:  None. FINDINGS: Brain: No acute infarction, hemorrhage, hydrocephalus, extra-axial collection or mass lesion. Few scattered nonspecific punctate foci of T2 FLAIR hyperintensity in subcortical white matter of unlikely clinical significance. Vascular: Normal flow voids. Skull and upper cervical spine: Normal marrow signal. Sinuses/Orbits: Mild maxillary sinus mucosal thickening. Otherwise negative. Other: None. IMPRESSION: No acute intracranial abnormality identified. Unremarkable MRI of the brain. Electronically Signed   By: Mitzi Hansen M.D.   On: 01/19/2017 22:04   Dg Chest Port 1 View  Result Date: 01/20/2017 CLINICAL DATA:  Hypoxia EXAM: PORTABLE CHEST 1 VIEW COMPARISON:  January 19, 2017 FINDINGS: Endotracheal tube tip is 5.0 cm above the carina. Nasogastric tube  tip and side port are below the diaphragm with side port seen in the stomach. No pneumothorax. There is bibasilar atelectasis. Lungs elsewhere clear. Heart size and pulmonary vascularity are normal. No adenopathy. No evident bone lesions. IMPRESSION: Tube positions as described without evident pneumothorax. Bibasilar atelectasis. Lungs elsewhere clear. Stable cardiac silhouette. Electronically Signed   By: Bretta Bang III M.D.   On: 01/20/2017 07:09   Dg Chest Portable 1 View  Result Date: 01/19/2017 CLINICAL DATA:  Intubated, code stroke EXAM: PORTABLE CHEST 1 VIEW COMPARISON:  None. FINDINGS: Enteric tube enters stomach with the tip not seen on this image. Endotracheal tube tip is 5.0 cm above the carina. Normal mediastinal contour. No pneumothorax. No pleural effusion. Mild bibasilar scarring versus atelectasis. No pulmonary edema. IMPRESSION: 1. Endotracheal tube tip 5.0 cm above the carina. 2.  Enteric tube enters stomach with the tip not seen on this image. 3. Mild bibasilar scarring versus atelectasis. Electronically Signed   By: Delbert Phenix M.D.   On: 01/19/2017 19:54   Ct Head Code Stroke W/o Cm  Result Date: 01/19/2017 CLINICAL DATA:  36 y/o  M; code stroke. EXAM: CT HEAD WITHOUT CONTRAST CT ANGIOGRAPHY HEAD AND NECK TECHNIQUE: Multidetector CT imaging of the head and neck was performed using the standard protocol during bolus administration of intravenous contrast. Multiplanar CT image reconstructions and MIPs were obtained to evaluate the vascular anatomy. Carotid stenosis measurements (when applicable) are obtained utilizing NASCET criteria, using the distal internal carotid diameter  as the denominator. CONTRAST:  50 cc Isovue 370 COMPARISON:  None. FINDINGS: CT HEAD FINDINGS Brain: No evidence of acute infarction, hemorrhage, hydrocephalus, extra-axial collection or mass lesion/mass effect. ASPECTS is 10. Vascular: As below. Skull: Normal. Negative for fracture or focal lesion. Sinuses:  Imaged portions are clear. Orbits: No acute finding. Review of the MIP images confirms the above findings CTA NECK FINDINGS Aortic arch: Standard branching. Imaged portion shows no evidence of aneurysm or dissection. No significant stenosis of the major arch vessel origins. Right carotid system: No evidence of dissection, stenosis (50% or greater) or occlusion. Left carotid system: No evidence of dissection, stenosis (50% or greater) or occlusion. Vertebral arteries: Right dominant. No evidence of dissection, stenosis (50% or greater) or occlusion. Skeleton: No significant cervical degenerative changes. No high-grade bony canal stenosis. No acute osseous abnormality. Other neck: Negative. Upper chest: Endotracheal tube and nasoenteric tubes noted. Review of the MIP images confirms the above findings CTA HEAD FINDINGS Anterior circulation: No significant stenosis, proximal occlusion, aneurysm, or vascular malformation. Posterior circulation: No significant stenosis, proximal occlusion, aneurysm, or vascular malformation. Venous sinuses: As permitted by contrast timing, patent. Anatomic variants: Patent anterior communicating artery and right posterior communicating artery. No left posterior communicating artery identified, likely hypoplastic or absent. Review of the MIP images confirms the above findings IMPRESSION: CT head: 1. No acute intracranial abnormality identified. 2. ASPECTS is 10. CTA neck: Carotid and vertebral arteries are patent. No dissection, aneurysm, or significant stenosis is identified. CTA head: Patent circle of Willis. No large vessel occlusion, aneurysm, or significant stenosis is identified. These results were called by telephone at the time of interpretation on 01/19/2017 at 7:50 pm to Dr. Marily MemosJASON MESNER, who verbally acknowledged these results. Electronically Signed   By: Mitzi HansenLance  Furusawa-Stratton M.D.   On: 01/19/2017 19:53     STUDIES:  CT angio head/neck 7/16> No acute intracranial  abnormality identified.  CXR 7/16>  Mild basilar scarring vs atelectasis MRI brain 7/16> No acute intracranial abnormality. UDS 7/16> + amphetamines, benzo CXR 7/17> Bibasilar atelectasis. Lungs elsewhere clear.  CULTURES: MRSA culture 7/17>   ANTIBIOTICS: none  SIGNIFICANT EVENTS: 7/16 admitted  LINES/TUBES: ETT 7/16 > 7/17  NGT 7/16> 7/17 Foley 7/16> 7/17 PIV x2  DISCUSSION: 36yo male with PMH anxiety, depression, chronic back pain and recent possible tick bite here acute encephalopathy and respiratory failure, now back to neuro baseline.   ASSESSMENT / PLAN:  NEUROLOGIC A:   Acute encephalopathy, resolved ?Serotonin syndrome Concern for polysubstance abuse, + amphetamines on UDS and ?h/o K2 use P:   RASS goal: 0, -1 DC Propofol gtt DC PRN versed Frequent neuro checks EEG, not needed, cancel Neurology following Urine drug screen Poison control following  PULMONARY A: Acute respiratory failure 2/2 AMS, improved P:   Extubated, wean O2 as tolerated  CARDIOVASCULAR A:  Prolonged QTc 2/2 ?serotonin syndrome, resolved P:  Monitor on telemetry Echo   RENAL A:   AKI  P:   Monitor BMP Replete electrolytes as indicated IVF   GASTROINTESTINAL A:   GI ppx P:   protonix NPO, plan to start diet later today   HEMATOLOGIC A:   DVT ppx P:  Heparin SQ  INFECTIOUS A:   No acute issues P:   Monitor off ABX Consider LP  ENDOCRINE A:   No acute issues   P:   Monitor glucose on BMP  FAMILY  - Updates: Wife updated at bedside  Leland HerElsia J Aitan Rossbach, DO PGY-2, [facility] 01/20/2017  10:07 AM

## 2017-01-20 NOTE — Care Management Note (Signed)
Case Management Note  Patient Details  Name: Delton CoombesJoshua Frady MRN: [id number] Date of Birth: Feb 25, 1981  Subjective/Objective:     Pt admitted with AMS - positive for Amphetamines and Benzo's              Action/Plan:   PTA from home with wife.     Expected Discharge Date:                  Expected Discharge Plan:  Home/Self Care  In-House Referral:     Discharge planning Services  CM Consult  Post Acute Care Choice:    Choice offered to:     DME Arranged:    DME Agency:     HH Arranged:    HH Agency:     Status of Service:     If discussed at [employer] Length of Stay Meetings, dates discussed:    Additional Comments:  Cherylann ParrClaxton, Jaice Lague S, RN 01/20/2017, 3:02 PM

## 2017-01-20 NOTE — Significant Event (Signed)
Patient transferring to 6E20, taken via wheelchair with SWOT RN. Report given to receiving RN in 6E. No personal belongings to take to new room. Family at bedside.    Darren Mason

## 2017-01-20 NOTE — Progress Notes (Signed)
Subjective: Markedly improved overnight.   Exam: Vitals:   01/20/17 0700 01/20/17 0800  BP:  129/78  Pulse:  79  Resp:  (!) 24  Temp: 99.9 F (37.7 C) 99.5 F (37.5 C)   Gen: In bed, intubatd Resp: ventilated Abd: soft, nt   Neuro: MS: awake, alert. Follows commands  BJ:YNWGNCN:PERRL, blinks to eyelid stim bilaterally Motor: moves all extremities with good strength to command.  Sensory: indicates he has symmetric sensation  Pertinent Labs: Cr 1.3  Impression: [age] yo M presenting with coma, blown pupils with rapid improvement. I agree that drug effect/serotonin syndrome seems like the most likely culprit.   After extubation, I returned and spoke with the patient. He does admit to smoking K2, but states that he has done this before without effect. I suspect this was serotonin syndrome secondary to Lexapro, K2, Soma.  Recommendations: 1) avoid illicit drugs 2) discontinue Lexapro 3) may need to discuss with psychiatry given that we are stopping psychiatric medication 4) no further recommendations at this time, neurology will sign off. Please call if further questions or concerns.  Ritta SlotMcNeill Avabella Wailes, MD [facility] [phone]  If 7pm- 7am, please page neurology on call as listed in AMION.

## 2017-01-20 NOTE — Progress Notes (Signed)
  Echocardiogram 2D Echocardiogram has been performed.  Janalyn HarderWest, Michol Emory R 01/20/2017, 3:19 PM

## 2017-01-20 NOTE — Procedures (Signed)
Extubation Procedure Note  Patient Details:   Name: Delton CoombesJoshua Bovee DOB: [birth year] MRN: [id number]   Airway Documentation:     Evaluation  O2 sats: stable throughout and currently acceptable Complications: No apparent complications Patient did tolerate procedure well. Bilateral Breath Sounds: Rhonchi   Yes  Antoine Pocherogdon, Emmy Keng Caroline 01/20/2017, 8:43 AM

## 2017-01-20 NOTE — Progress Notes (Signed)
Patient admitted to 6E20 from 85M. Brought over in a wheelchair with parent. Belongings with patient. Patient oriented. Telemetry applied as ordered. Patient comfortable in bed, call bed in place. NT to take VS.   Avelina LaineKimberly Amare Kontos RN

## 2017-01-20 NOTE — Progress Notes (Signed)
Patient did admit to smoking K2 spice yesterday, in addition to taking his daily soma and an extra dose of lexapro after missing his dose the prior day.    Denies any addition ingestion.  Denies thoughts of self harm or desire to harm others. Denies suicidal ideation or intent.   Darren Mason L. Myrtie SomanWarden, MD [facility] Resident PGY-2 01/20/2017 3:03 PM

## 2017-01-21 DIAGNOSIS — R4182 Altered mental status, unspecified: Secondary | ICD-10-CM

## 2017-01-21 LAB — MRSA CULTURE: Culture: NOT DETECTED

## 2017-01-21 LAB — HIV ANTIBODY (ROUTINE TESTING W REFLEX): HIV SCREEN 4TH GENERATION: NONREACTIVE

## 2017-01-21 NOTE — Progress Notes (Signed)
  Speech Language Pathology Treatment: Dysphagia  Patient Details Name: Darren Mason MRN: [id number] DOB: 1981-01-30 Today's Date: 01/21/2017 Time: 1212-1225 SLP Time Calculation (min) (ACUTE ONLY): 13 min  Assessment / Plan / Recommendation Clinical Impression  Pt with resolution of acute dysphagia after intubation/thrashing with breathing tube in place.  Voice and cough are strong. 3 ounce water test completed with great tolerance.  Pt not recurrently clearing his throat or coughing today!  He does report a little continued discomfort in throat and at catheter placement.  Dysphagia has resolved = no follow up indicated.   HPI HPI: Darren Mason is an 36 y.o. male who was brought to the ED by EMS after being found combative and nonverbal with altered mentation at home- medication bottles found in house Soma and Oxycodone/Tylenol prescription bottles.  GCS of 3 with snoring respirations were concerning for imminent respiratory decompensation and he was intubated.  Pt PMH + for chronic back pain and pt was scheduled for MRI of back today.  Pt extubated today and swallow evaluation ordered.  Pt admits to recent episode with dizziness, etc and received antibiotic from Urgent Care approximately one week ago = also recent tick bite.  CXR and MRI brain negative.         SLP Plan          Recommendations  Diet recommendations: Regular;Thin liquid Medication Administration: Whole meds with liquid Compensations: Slow rate;Small sips/bites Postural Changes and/or Swallow Maneuvers: Seated upright 90 degrees;Out of bed for meals                Oral Care Recommendations: Oral care BID Follow up Recommendations: None SLP Visit Diagnosis: Dysphagia, unspecified (R13.10)       GO               Darren Burnetamara Alexzandria Massman, MS [facility] SLP [phone]  Chales AbrahamsKimball, Amiel Mccaffrey Ann 01/21/2017, 12:56 PM

## 2017-01-21 NOTE — Progress Notes (Signed)
Discharge instructions given on medications, and follow up visits,patient verbalized understanding. IV discontinued, catheter intact. Accompanied by staff to an awaiting vehicle.

## 2017-01-21 NOTE — Discharge Summary (Addendum)
Physician Discharge Summary  Darren Mason ZOX:[id number] DOB: [birth year] DOA: 01/19/2017  PCP: Doreene Nest, NP  Admit date: 01/19/2017 Discharge date: 01/21/2017  Admitted From: Home Disposition:  Home  Recommendations for Outpatient Follow-up:  1. Follow up with PCP in 1-2 weeks 2. Please consider alternative psychiatric medication  Discharge Condition:Improved CODE STATUS:Full Diet recommendation: Regular   Brief/Interim Summary: Please see dictated H&P for further details. Briefly patient is a 36 year old male with history of anxiety, depression, chronic back pain who presented with altered mental status. Patient was recently treated for presumed tick bite with doxycycline patient was in his usual state of health until the day of hospital admission when he was found slumped over and mumbling. Patient was brought to emergency department via EMS where patient was noted have dilated pupils and was unable to maintain an airway. Patient was intubated and admitted to the critical care service.  During this hospital course, patient was intubated, initially hypothermic requiring Bair hugger and subsequently febrile with a MAXIMUM TEMPERATURE of 102F. Neurology was consulted. On further questioning, patient did admit to smoking K2 in addition to his other psychiatric medications. Neurology has since recommended to discontinue Lexapro. Recommend patient follow-up with primary care provider to discuss alternative psychiatric medications. Patient has since remained medically stable and is back to his baseline state. Patient to recommend follow-up closely with his PCP  Drug related encephalopathy  Discharge Diagnoses:  Active Problems:   Altered mental state    Discharge Instructions   Allergies as of 01/21/2017   No Known Allergies     Medication List    STOP taking these medications   buPROPion 300 MG 24 hr tablet Commonly known as:  WELLBUTRIN XL   escitalopram 10 MG  tablet Commonly known as:  LEXAPRO   predniSONE 20 MG tablet Commonly known as:  DELTASONE     TAKE these medications   carisoprodol 350 MG tablet Commonly known as:  SOMA Take 350 mg by mouth 3 (three) times daily as needed.   diclofenac 75 MG EC tablet Commonly known as:  VOLTAREN Take 75 mg by mouth 2 (two) times daily.   doxycycline 100 MG capsule Commonly known as:  VIBRAMYCIN Take 100 mg by mouth 2 (two) times daily. For 14 days   lisdexamfetamine 50 MG capsule Commonly known as:  VYVANSE Take 50 mg by mouth daily.   oxyCODONE-acetaminophen 10-325 MG tablet Commonly known as:  PERCOCET Take 1 tablet by mouth 4 (four) times daily as needed for pain.   testosterone cypionate 200 MG/ML injection Commonly known as:  DEPOTESTOSTERONE CYPIONATE Inject 200 mg into the muscle See admin instructions. Inject 200 mg IM every 10 days      Follow-up Information    Doreene Nest, NP. Schedule an appointment as soon as possible for a visit in 1 week(s).   Specialty:  Internal Medicine Contact information: [address] [place] [address] [phone]          No Known Allergies  Consultations:  Neurology, critical care  Procedures/Studies: Ct Angio Head W Or Wo Contrast  Result Date: 01/19/2017 CLINICAL DATA:  36 y/o  M; code stroke. EXAM: CT HEAD WITHOUT CONTRAST CT ANGIOGRAPHY HEAD AND NECK TECHNIQUE: Multidetector CT imaging of the head and neck was performed using the standard protocol during bolus administration of intravenous contrast. Multiplanar CT image reconstructions and MIPs were obtained to evaluate the vascular anatomy. Carotid stenosis measurements (when applicable) are obtained utilizing NASCET criteria, using the distal  internal carotid diameter as the denominator. CONTRAST:  50 cc Isovue 370 COMPARISON:  None. FINDINGS: CT HEAD FINDINGS Brain: No evidence of acute infarction, hemorrhage, hydrocephalus, extra-axial collection or mass  lesion/mass effect. ASPECTS is 10. Vascular: As below. Skull: Normal. Negative for fracture or focal lesion. Sinuses: Imaged portions are clear. Orbits: No acute finding. Review of the MIP images confirms the above findings CTA NECK FINDINGS Aortic arch: Standard branching. Imaged portion shows no evidence of aneurysm or dissection. No significant stenosis of the major arch vessel origins. Right carotid system: No evidence of dissection, stenosis (50% or greater) or occlusion. Left carotid system: No evidence of dissection, stenosis (50% or greater) or occlusion. Vertebral arteries: Right dominant. No evidence of dissection, stenosis (50% or greater) or occlusion. Skeleton: No significant cervical degenerative changes. No high-grade bony canal stenosis. No acute osseous abnormality. Other neck: Negative. Upper chest: Endotracheal tube and nasoenteric tubes noted. Review of the MIP images confirms the above findings CTA HEAD FINDINGS Anterior circulation: No significant stenosis, proximal occlusion, aneurysm, or vascular malformation. Posterior circulation: No significant stenosis, proximal occlusion, aneurysm, or vascular malformation. Venous sinuses: As permitted by contrast timing, patent. Anatomic variants: Patent anterior communicating artery and right posterior communicating artery. No left posterior communicating artery identified, likely hypoplastic or absent. Review of the MIP images confirms the above findings IMPRESSION: CT head: 1. No acute intracranial abnormality identified. 2. ASPECTS is 10. CTA neck: Carotid and vertebral arteries are patent. No dissection, aneurysm, or significant stenosis is identified. CTA head: Patent circle of Willis. No large vessel occlusion, aneurysm, or significant stenosis is identified. These results were called by telephone at the time of interpretation on 01/19/2017 at 7:50 pm to Dr. Marily Memos, who verbally acknowledged these results. Electronically Signed   By: Mitzi Hansen M.D.   On: 01/19/2017 19:53   Ct Angio Neck W Or Wo Contrast  Result Date: 01/19/2017 CLINICAL DATA:  36 y/o  M; code stroke. EXAM: CT HEAD WITHOUT CONTRAST CT ANGIOGRAPHY HEAD AND NECK TECHNIQUE: Multidetector CT imaging of the head and neck was performed using the standard protocol during bolus administration of intravenous contrast. Multiplanar CT image reconstructions and MIPs were obtained to evaluate the vascular anatomy. Carotid stenosis measurements (when applicable) are obtained utilizing NASCET criteria, using the distal internal carotid diameter as the denominator. CONTRAST:  50 cc Isovue 370 COMPARISON:  None. FINDINGS: CT HEAD FINDINGS Brain: No evidence of acute infarction, hemorrhage, hydrocephalus, extra-axial collection or mass lesion/mass effect. ASPECTS is 10. Vascular: As below. Skull: Normal. Negative for fracture or focal lesion. Sinuses: Imaged portions are clear. Orbits: No acute finding. Review of the MIP images confirms the above findings CTA NECK FINDINGS Aortic arch: Standard branching. Imaged portion shows no evidence of aneurysm or dissection. No significant stenosis of the major arch vessel origins. Right carotid system: No evidence of dissection, stenosis (50% or greater) or occlusion. Left carotid system: No evidence of dissection, stenosis (50% or greater) or occlusion. Vertebral arteries: Right dominant. No evidence of dissection, stenosis (50% or greater) or occlusion. Skeleton: No significant cervical degenerative changes. No high-grade bony canal stenosis. No acute osseous abnormality. Other neck: Negative. Upper chest: Endotracheal tube and nasoenteric tubes noted. Review of the MIP images confirms the above findings CTA HEAD FINDINGS Anterior circulation: No significant stenosis, proximal occlusion, aneurysm, or vascular malformation. Posterior circulation: No significant stenosis, proximal occlusion, aneurysm, or vascular malformation. Venous sinuses: As  permitted by contrast timing, patent. Anatomic variants: Patent anterior communicating artery and right  posterior communicating artery. No left posterior communicating artery identified, likely hypoplastic or absent. Review of the MIP images confirms the above findings IMPRESSION: CT head: 1. No acute intracranial abnormality identified. 2. ASPECTS is 10. CTA neck: Carotid and vertebral arteries are patent. No dissection, aneurysm, or significant stenosis is identified. CTA head: Patent circle of Willis. No large vessel occlusion, aneurysm, or significant stenosis is identified. These results were called by telephone at the time of interpretation on 01/19/2017 at 7:50 pm to Dr. Marily Memos, who verbally acknowledged these results. Electronically Signed   By: Mitzi Hansen M.D.   On: 01/19/2017 19:53   Mr Brain Wo Contrast  Result Date: 01/19/2017 CLINICAL DATA:  36 y/o M; altered mental status, evaluate for stroke. EXAM: MRI HEAD WITHOUT CONTRAST TECHNIQUE: Multiplanar, multiecho pulse sequences of the brain and surrounding structures were obtained without intravenous contrast. COMPARISON:  None. FINDINGS: Brain: No acute infarction, hemorrhage, hydrocephalus, extra-axial collection or mass lesion. Few scattered nonspecific punctate foci of T2 FLAIR hyperintensity in subcortical white matter of unlikely clinical significance. Vascular: Normal flow voids. Skull and upper cervical spine: Normal marrow signal. Sinuses/Orbits: Mild maxillary sinus mucosal thickening. Otherwise negative. Other: None. IMPRESSION: No acute intracranial abnormality identified. Unremarkable MRI of the brain. Electronically Signed   By: Mitzi Hansen M.D.   On: 01/19/2017 22:04   Dg Chest Port 1 View  Result Date: 01/20/2017 CLINICAL DATA:  Hypoxia EXAM: PORTABLE CHEST 1 VIEW COMPARISON:  January 19, 2017 FINDINGS: Endotracheal tube tip is 5.0 cm above the carina. Nasogastric tube tip and side port are below the  diaphragm with side port seen in the stomach. No pneumothorax. There is bibasilar atelectasis. Lungs elsewhere clear. Heart size and pulmonary vascularity are normal. No adenopathy. No evident bone lesions. IMPRESSION: Tube positions as described without evident pneumothorax. Bibasilar atelectasis. Lungs elsewhere clear. Stable cardiac silhouette. Electronically Signed   By: Bretta Bang III M.D.   On: 01/20/2017 07:09   Dg Chest Portable 1 View  Result Date: 01/19/2017 CLINICAL DATA:  Intubated, code stroke EXAM: PORTABLE CHEST 1 VIEW COMPARISON:  None. FINDINGS: Enteric tube enters stomach with the tip not seen on this image. Endotracheal tube tip is 5.0 cm above the carina. Normal mediastinal contour. No pneumothorax. No pleural effusion. Mild bibasilar scarring versus atelectasis. No pulmonary edema. IMPRESSION: 1. Endotracheal tube tip 5.0 cm above the carina. 2.  Enteric tube enters stomach with the tip not seen on this image. 3. Mild bibasilar scarring versus atelectasis. Electronically Signed   By: Delbert Phenix M.D.   On: 01/19/2017 19:54   Ct Head Code Stroke W/o Cm  Result Date: 01/19/2017 CLINICAL DATA:  36 y/o  M; code stroke. EXAM: CT HEAD WITHOUT CONTRAST CT ANGIOGRAPHY HEAD AND NECK TECHNIQUE: Multidetector CT imaging of the head and neck was performed using the standard protocol during bolus administration of intravenous contrast. Multiplanar CT image reconstructions and MIPs were obtained to evaluate the vascular anatomy. Carotid stenosis measurements (when applicable) are obtained utilizing NASCET criteria, using the distal internal carotid diameter as the denominator. CONTRAST:  50 cc Isovue 370 COMPARISON:  None. FINDINGS: CT HEAD FINDINGS Brain: No evidence of acute infarction, hemorrhage, hydrocephalus, extra-axial collection or mass lesion/mass effect. ASPECTS is 10. Vascular: As below. Skull: Normal. Negative for fracture or focal lesion. Sinuses: Imaged portions are clear.  Orbits: No acute finding. Review of the MIP images confirms the above findings CTA NECK FINDINGS Aortic arch: Standard branching. Imaged portion shows no evidence of aneurysm or  dissection. No significant stenosis of the major arch vessel origins. Right carotid system: No evidence of dissection, stenosis (50% or greater) or occlusion. Left carotid system: No evidence of dissection, stenosis (50% or greater) or occlusion. Vertebral arteries: Right dominant. No evidence of dissection, stenosis (50% or greater) or occlusion. Skeleton: No significant cervical degenerative changes. No high-grade bony canal stenosis. No acute osseous abnormality. Other neck: Negative. Upper chest: Endotracheal tube and nasoenteric tubes noted. Review of the MIP images confirms the above findings CTA HEAD FINDINGS Anterior circulation: No significant stenosis, proximal occlusion, aneurysm, or vascular malformation. Posterior circulation: No significant stenosis, proximal occlusion, aneurysm, or vascular malformation. Venous sinuses: As permitted by contrast timing, patent. Anatomic variants: Patent anterior communicating artery and right posterior communicating artery. No left posterior communicating artery identified, likely hypoplastic or absent. Review of the MIP images confirms the above findings IMPRESSION: CT head: 1. No acute intracranial abnormality identified. 2. ASPECTS is 10. CTA neck: Carotid and vertebral arteries are patent. No dissection, aneurysm, or significant stenosis is identified. CTA head: Patent circle of Willis. No large vessel occlusion, aneurysm, or significant stenosis is identified. These results were called by telephone at the time of interpretation on 01/19/2017 at 7:50 pm to Dr. Marily MemosJASON MESNER, who verbally acknowledged these results. Electronically Signed   By: Mitzi HansenLance  Furusawa-Stratton M.D.   On: 01/19/2017 19:53     Subjective: Feeling better today. Eager to go home    Discharge Exam: Vitals:    01/21/17 0319 01/21/17 0931  BP: 132/72 130/70  Pulse: 61 72  Resp: 16 18  Temp: 97.9 F (36.6 C) 98 F (36.7 C)   Vitals:   01/20/17 1718 01/20/17 2355 01/21/17 0319 01/21/17 0931  BP: 128/83 123/82 132/72 130/70  Pulse: 64 74 61 72  Resp: 15 18 16 18   Temp: 98.5 F (36.9 C) 98.2 F (36.8 C) 97.9 F (36.6 C) 98 F (36.7 C)  TempSrc: Oral   Oral  SpO2: 98% 98% 98% 99%  Weight:  99.8 kg (220 lb)    Height:  6\' 1"  (1.854 m)      General: Pt is alert, awake, not in acute distress Cardiovascular: RRR, S1/S2 +, no rubs, no gallops Respiratory: CTA bilaterally, no wheezing, no rhonchi Abdominal: Soft, NT, ND, bowel sounds + Extremities: no edema, no cyanosis   The results of significant diagnostics from this hospitalization (including imaging, microbiology, ancillary and laboratory) are listed below for reference.     Microbiology: Recent Results (from the past 240 hour(s))  MRSA PCR Screening     Status: Abnormal   Collection Time: 01/20/17 12:45 AM  Result Value Ref Range Status   MRSA by PCR INVALID RESULTS, SPECIMEN SENT FOR CULTURE (A) NEGATIVE Final    Comment: RESULT CALLED TO, READ BACK BY AND VERIFIED WITH: Rosaura CarpenterM SCHRAMM, RN 01/20/17 0502 L CHAMPION      Labs: BNP (last 3 results) No results for input(s): BNP in the last 8760 hours. Basic Metabolic Panel:  Recent Labs Lab 01/19/17 1856 01/19/17 1907 01/20/17 0226  NA 137 137 137  K 3.9 4.6 3.6  CL 103 102 102  CO2 23  --  24  GLUCOSE 135* 135* 103*  BUN 15 23* 15  CREATININE 1.34* 1.30* 1.30*  CALCIUM 8.4*  --  8.7*  MG  --   --  2.0  PHOS  --   --  5.4*   Liver Function Tests:  Recent Labs Lab 01/19/17 1856  AST 30  ALT 56  ALKPHOS 52  BILITOT 0.5  PROT 6.5  ALBUMIN 3.9   No results for input(s): LIPASE, AMYLASE in the last 168 hours. No results for input(s): AMMONIA in the last 168 hours. CBC:  Recent Labs Lab 01/19/17 1856 01/19/17 1907 01/20/17 0226  WBC 12.6*  --  10.5   NEUTROABS 9.1*  --   --   HGB 14.8 15.3 13.9  HCT 44.2 45.0 42.0  MCV 91.3  --  90.5  PLT 213  --  201   Cardiac Enzymes:  Recent Labs Lab 01/19/17 1912  CKTOTAL 222   BNP: Invalid input(s): POCBNP CBG:  Recent Labs Lab 01/20/17 0043 01/20/17 0320 01/20/17 0816 01/20/17 1157  GLUCAP 96 97 106* 86   D-Dimer No results for input(s): DDIMER in the last 72 hours. Hgb A1c No results for input(s): HGBA1C in the last 72 hours. Lipid Profile  Recent Labs  01/19/17 1912  TRIG 266*   Thyroid function studies No results for input(s): TSH, T4TOTAL, T3FREE, THYROIDAB in the last 72 hours.  Invalid input(s): FREET3 Anemia work up No results for input(s): VITAMINB12, FOLATE, FERRITIN, TIBC, IRON, RETICCTPCT in the last 72 hours. Urinalysis    Component Value Date/Time   COLORURINE YELLOW 01/19/2017 2207   APPEARANCEUR CLEAR 01/19/2017 2207   LABSPEC 1.021 01/19/2017 2207   PHURINE 7.0 01/19/2017 2207   GLUCOSEU NEGATIVE 01/19/2017 2207   HGBUR NEGATIVE 01/19/2017 2207   BILIRUBINUR NEGATIVE 01/19/2017 2207   KETONESUR NEGATIVE 01/19/2017 2207   PROTEINUR NEGATIVE 01/19/2017 2207   NITRITE NEGATIVE 01/19/2017 2207   LEUKOCYTESUR NEGATIVE 01/19/2017 2207   Sepsis Labs Invalid input(s): PROCALCITONIN,  WBC,  LACTICIDVEN Microbiology Recent Results (from the past 240 hour(s))  MRSA PCR Screening     Status: Abnormal   Collection Time: 01/20/17 12:45 AM  Result Value Ref Range Status   MRSA by PCR INVALID RESULTS, SPECIMEN SENT FOR CULTURE (A) NEGATIVE Final    Comment: RESULT CALLED TO, READ BACK BY AND VERIFIED WITH: Rosaura Carpenter, RN 01/20/17 0502 L CHAMPION      SIGNED:   Jerald Kief, MD  [facility] 01/21/2017, 12:22 PM  If 7PM-7AM, please contact night-coverage [email or web address] Password [email or web address]

## 2017-01-21 NOTE — Plan of Care (Signed)
Problem: Safety: Goal: Ability to remain free from injury will improve Outcome: Progressing Importance of calling for assistance stressed to patient.  Patient verbalized understanding and was compliant with patient safety measures.  Patient progressing towards goal.

## 2017-01-23 ENCOUNTER — Ambulatory Visit: Payer: 59 | Admitting: Primary Care

## 2017-01-23 ENCOUNTER — Ambulatory Visit (INDEPENDENT_AMBULATORY_CARE_PROVIDER_SITE_OTHER): Payer: 59 | Admitting: Primary Care

## 2017-01-23 ENCOUNTER — Encounter: Payer: Self-pay | Admitting: Primary Care

## 2017-01-23 VITALS — BP 148/84 | HR 97 | Temp 98.4°F | Ht 71.0 in | Wt 215.1 lb

## 2017-01-23 DIAGNOSIS — G8929 Other chronic pain: Secondary | ICD-10-CM | POA: Diagnosis not present

## 2017-01-23 DIAGNOSIS — W57XXXA Bitten or stung by nonvenomous insect and other nonvenomous arthropods, initial encounter: Secondary | ICD-10-CM | POA: Diagnosis not present

## 2017-01-23 DIAGNOSIS — N179 Acute kidney failure, unspecified: Secondary | ICD-10-CM

## 2017-01-23 DIAGNOSIS — F419 Anxiety disorder, unspecified: Secondary | ICD-10-CM | POA: Diagnosis not present

## 2017-01-23 DIAGNOSIS — E349 Endocrine disorder, unspecified: Secondary | ICD-10-CM | POA: Diagnosis not present

## 2017-01-23 DIAGNOSIS — F32A Depression, unspecified: Secondary | ICD-10-CM

## 2017-01-23 DIAGNOSIS — F329 Major depressive disorder, single episode, unspecified: Secondary | ICD-10-CM

## 2017-01-23 DIAGNOSIS — M5440 Lumbago with sciatica, unspecified side: Secondary | ICD-10-CM | POA: Diagnosis not present

## 2017-01-23 LAB — COMPREHENSIVE METABOLIC PANEL
ALT: 71 U/L — AB (ref 0–53)
AST: 33 U/L (ref 0–37)
Albumin: 4.6 g/dL (ref 3.5–5.2)
Alkaline Phosphatase: 61 U/L (ref 39–117)
BUN: 13 mg/dL (ref 6–23)
CALCIUM: 10.1 mg/dL (ref 8.4–10.5)
CHLORIDE: 102 meq/L (ref 96–112)
CO2: 30 meq/L (ref 19–32)
Creatinine, Ser: 1.24 mg/dL (ref 0.40–1.50)
GFR: 70.17 mL/min (ref >60.00)
GLUCOSE: 102 mg/dL — AB (ref 70–99)
POTASSIUM: 4.2 meq/L (ref 3.5–5.1)
Sodium: 139 mEq/L (ref 135–145)
Total Bilirubin: 0.4 mg/dL (ref 0.2–1.2)
Total Protein: 7.6 g/dL (ref 6.0–8.3)

## 2017-01-23 NOTE — Progress Notes (Signed)
Subjective:    Patient ID: Darren CoombesJoshua Mason, male    DOB: 1980-10-16, 36 y.o.   MRN: [id number]  HPI  Mr. Schirtzinger is a 36 year old male with a history of chronic back pain and osteoarthritis, anxiety disorder managed on narcotics, SSRI who presents today for hospital follow up.  [facility] with altered mental status. Several pill bottles were found near him: Vyvanse, K2 Spice, Lexapro, Wellbutrin, Soma, Voltaren, Percocet. He was minimally responsive for EMS, the initial concern was stroke.  During his stay in the ED he was immediately intubated given GCS and low oxygen levels. His CT and MRI were unremarkable. Urine drug screen was positive for benzodiazepines, amphetamines; labs with acute kidney injury, mild leukocytosis. They were uncertain of the cause of his symptoms with differentials including suicide, accidental overdose, tick bite, serotonin syndrome. He was admitted to the ICU for further evaluation. He was treated at urgent care on 01/14/17 with IV fluids and Doxycycline course for treatment of diaphoresis for possible tick bite several days prior. His follow up urgent care visit on 01/16/17 with improved symptoms.  During his stay in the ICU he underwent treatment including activated charcoal, IV fluids. Neurology was consulted who suspected sympathomimetic vs serotonin syndrome. He improved overnight and was extubated on 01/20/17, was able to speak with neurologist and admitted to smoking K2 and taking an extra dose of his Lexapro as he forgot his dose the day prior. Neurology suspected serotonin syndrome and discontinued his Lexapro and Wellbutrin. He was discharged home on 01/21/17 with recommendations for PCP follow up.  Since his discharge home he's not taken any of his Lexapro and Wellbutrin. Overall he's feeling okay, experiencing some anxiety due to recent events. He's experiencing increased pain to his lower back, hips and is following with his ortho and pain  management MD in [place]. He is working with his orthopedist and pain management MD and was told that he needed to quit his job as it's too physically demanding. 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  He is still following with pain management and orthopedics in [place]. He's still taking Soma and Percocet. He is still seeing his PCP in [place] who is prescribing his Vyvanse, Lexapro, Wellbutrin, testosterone.  He's been taking Vyvanse for the past three years for symptoms of difficulty concentrating, easily distracted, trouble staying on task. He's also still managed on testosterone. He has an appointment with his PCP in [place] in early September. He is needing a work excuse for next week due to increased back pain and time for recovery.    Review of Systems  Constitutional: Negative for fever.  Respiratory: Negative for shortness of breath.   Cardiovascular: Negative for chest pain.  Gastrointestinal: Negative for abdominal pain.  Musculoskeletal: Positive for arthralgias and back pain.  Neurological: Negative for dizziness, tremors and headaches.  Psychiatric/Behavioral: Negative for confusion.        No past medical history on file.   Social History   Social History  . Marital status: Married    Spouse name: N/A  . Number of children: N/A  . Years of education: N/A   Occupational History  . Not on file.   Social History Main Topics  . Smoking status: Never Smoker  . Smokeless tobacco: Not on file  . Alcohol use Yes  . Drug use: Unknown  . Sexual activity: Not on file   Other Topics Concern  .  Not on file   Social History Narrative  . No narrative on file    No past surgical history on file.  No family history on file.  No Known Allergies  Current Outpatient Prescriptions on File Prior to Visit  Medication Sig Dispense Refill  . carisoprodol (SOMA) 350 MG tablet Take 350 mg by mouth 3  (three) times daily as needed.     Marland Kitchen lisdexamfetamine (VYVANSE) 50 MG capsule Take 50 mg by mouth daily.    Marland Kitchen oxyCODONE-acetaminophen (PERCOCET) 10-325 MG tablet Take 1 tablet by mouth 4 (four) times daily as needed for pain.    Marland Kitchen testosterone cypionate (DEPOTESTOSTERONE CYPIONATE) 200 MG/ML injection Inject 200 mg into the muscle See admin instructions. Inject 200 mg IM every 10 days    . diclofenac (VOLTAREN) 75 MG EC tablet Take 75 mg by mouth 2 (two) times daily.      No current facility-administered medications on file prior to visit.     BP (!) 148/84   Pulse 97   Temp 98.4 F (36.9 C) (Oral)   Ht 5\' 11"  (1.803 m)   Wt 215 lb 1.9 oz (97.6 kg)   SpO2 99%   BMI 30.00 kg/m    Objective:   Physical Exam  Constitutional: He appears well-nourished.  Neck: Neck supple.  Cardiovascular: Normal rate and regular rhythm.   Pulmonary/Chest: Effort normal and breath sounds normal.  Musculoskeletal:  Decrease in ROM to lumbar spine noted upon ambulation.  Skin: Skin is warm and dry.  Psychiatric: He has a normal mood and affect.          Assessment & Plan:  Hospital Follow Up:  Diagnosed with serotonin syndrome. Treated in the hospital, improved within 24 hours. Discussed today to continue to remain off Lexapro and Wellbutrin. Strongly discouraged illicit drug use. Notified him that he will need to discuss treatment with his current PCP in [place]. Also discussed that he will need to choose one PCP as multiple PCP's can be dangerous. He will think about this. Exam today unremarkable. Does appear to be uncomfortable with back and hip pain. Work note provided for 3 days next week. Check BMP. Family wants Lyme Panel completed, low suspicion but ordered. He will discuss removal from work with his orthopedist and pain management team.  All hospital notes, imaging, labs reviewed. Morrie Sheldon, NP

## 2017-01-23 NOTE — Assessment & Plan Note (Signed)
Following with PCP in [place]. Discussed that if he chooses to stay with our group that I will need to send him to Urology for treatment, he verbalized understanding.

## 2017-01-23 NOTE — Assessment & Plan Note (Addendum)
Serotonin syndrome on 01/19/17. Overall okay off of Lexapro and Wellbutrin, these were prescribed by his PCP in [place]. Discussed that he needs to decide on one PCP to manage this as this could be dangerous. Consider adding in low dose Buspar, neurology recommended Cymbalta as an option. Will defer treatment to his current PCP in [place] until he decides on a PCP.

## 2017-01-23 NOTE — Assessment & Plan Note (Signed)
Following with orthopedics and pain management in [place].

## 2017-01-23 NOTE — Patient Instructions (Signed)
Please follow up with your primary care provider regarding your medications.  Follow up with pain management and orthopedics for your pain.  Refrain from illicit drug use, Lexapro, Wellbutrin.  Complete lab work prior to leaving today. I will notify you of your results once received.   It was a pleasure to see you today!

## 2017-01-23 NOTE — Assessment & Plan Note (Signed)
Following with ortho and pain management in [place].

## 2017-01-26 ENCOUNTER — Ambulatory Visit
Admission: RE | Admit: 2017-01-26 | Discharge: 2017-01-26 | Disposition: A | Discharge status: Home / Self Care | Payer: 59 | Source: Ambulatory Visit | Attending: Pain Medicine | Admitting: Pain Medicine

## 2017-01-26 DIAGNOSIS — M5416 Radiculopathy, lumbar region: Secondary | ICD-10-CM | POA: Diagnosis present

## 2017-01-26 DIAGNOSIS — M5116 Intervertebral disc disorders with radiculopathy, lumbar region: Secondary | ICD-10-CM | POA: Diagnosis not present

## 2017-01-26 LAB — LYME AB/WESTERN BLOT REFLEX: B burgdorferi Ab IgG+IgM: <0.9 Index (ref <0.90)

## 2017-01-27 ENCOUNTER — Emergency Department: Payer: 59

## 2017-01-27 ENCOUNTER — Telehealth: Payer: Self-pay

## 2017-01-27 ENCOUNTER — Encounter: Payer: Self-pay | Admitting: Emergency Medicine

## 2017-01-27 ENCOUNTER — Emergency Department
Admission: EM | Admit: 2017-01-27 | Discharge: 2017-01-27 | Disposition: A | Discharge status: Home / Self Care | Payer: 59 | Attending: Student in an Organized Health Care Education/Training Program | Admitting: Student in an Organized Health Care Education/Training Program

## 2017-01-27 ENCOUNTER — Telehealth: Payer: Self-pay | Admitting: Primary Care

## 2017-01-27 DIAGNOSIS — Z79899 Other long term (current) drug therapy: Secondary | ICD-10-CM | POA: Diagnosis not present

## 2017-01-27 DIAGNOSIS — R1030 Lower abdominal pain, unspecified: Secondary | ICD-10-CM | POA: Diagnosis present

## 2017-01-27 DIAGNOSIS — R300 Dysuria: Secondary | ICD-10-CM

## 2017-01-27 DIAGNOSIS — F17220 Nicotine dependence, chewing tobacco, uncomplicated: Secondary | ICD-10-CM | POA: Diagnosis not present

## 2017-01-27 DIAGNOSIS — R52 Pain, unspecified: Secondary | ICD-10-CM

## 2017-01-27 DIAGNOSIS — Z791 Long term (current) use of non-steroidal anti-inflammatories (NSAID): Secondary | ICD-10-CM | POA: Insufficient documentation

## 2017-01-27 LAB — URINE DRUGS OF ABUSE SCREEN W ALC, ROUTINE (REF LAB)
BARBITURATE, UR: NEGATIVE ng/mL
CANNABINOID QUANT UR: NEGATIVE ng/mL
COCAINE (METAB.): NEGATIVE ng/mL
Ethanol U, Quan: NEGATIVE %
Methadone Screen, Urine: NEGATIVE ng/mL
OPIATE QUANT UR: NEGATIVE ng/mL
PHENCYCLIDINE, UR: NEGATIVE ng/mL
Propoxyphene, Urine: NEGATIVE ng/mL

## 2017-01-27 LAB — URINALYSIS, COMPLETE (UACMP) WITH MICROSCOPIC
BACTERIA UA: NONE SEEN
Bilirubin Urine: NEGATIVE
Glucose, UA: NEGATIVE mg/dL
Hgb urine dipstick: NEGATIVE
KETONES UR: NEGATIVE mg/dL
LEUKOCYTES UA: NEGATIVE
Nitrite: NEGATIVE
PROTEIN: NEGATIVE mg/dL
SQUAMOUS EPITHELIAL / LPF: NONE SEEN
Specific Gravity, Urine: 1.015 (ref 1.005–1.030)
pH: 5 (ref 5.0–8.0)

## 2017-01-27 LAB — AMPHETAMINE CONF, UR
AMPHETAMINE CONF, UR: 2320 ng/mL
AMPHETAMINE: POSITIVE — AB
AMPHETAMINES, URINE: POSITIVE — AB
Methamphetamine, Ur: NEGATIVE

## 2017-01-27 LAB — DRUG PROFILE 799031: BENZODIAZEPINES: NEGATIVE

## 2017-01-27 MED ORDER — PHENAZOPYRIDINE HCL 200 MG PO TABS: 200.0000 mg | ORAL_TABLET | Freq: Three times a day (TID) | ORAL | 0 refills | Status: DC | PRN

## 2017-01-27 NOTE — ED Provider Notes (Signed)
[facility] Emergency Department Provider Note  ____________________________________________   First MD Initiated Contact with Patient 01/27/17 1159     (approximate)  I have reviewed the triage vital signs and the nursing notes.   HISTORY  Chief Complaint Groin Pain   HPI Darren Mason is a 36 y.o. male is here with complaint of groin and penile pain. Patient states that he was recently admitted to the hospital and had a catheter placed. Patient states that when the catheter was removed he began having sensation of irritation. He is concerned because he feels as if he is not urinating as often as he did before the catheter. Patient is still able to void. He denies any nausea, vomiting, fever or chills. He denies any history of urinary tract infections and denies hematuria. Patient denies any discharge and denies any sexual intercourse since his hospitalization. He also complains of bilateral scrotal pain. His mother is present with him and states that they called to urology offices and both offices advised them to come to the emergency room.    Past Medical History:  Diagnosis Date  . ADHD   . Anxiety and depression   . Chronic back pain   . Osteoarthritis   . Serotonin syndrome   . Testosterone deficiency     Patient Active Problem List   Diagnosis Date Noted  . Altered mental state 01/19/2017  . Ingrown toenail 05/24/2016  . Testosterone deficiency 05/24/2016  . Anxiety and depression 05/24/2016  . Osteoarthritis 05/24/2016  . Chronic back pain 05/24/2016    History reviewed. No pertinent surgical history.  Prior to Admission medications   Medication Sig Start Date End Date Taking? Authorizing Provider  carisoprodol (SOMA) 350 MG tablet Take 350 mg by mouth 3 (three) times daily as needed.  05/21/16   [provider]  diclofenac (VOLTAREN) 75 MG EC tablet Take 75 mg by mouth 2 (two) times daily.  05/03/16   [provider]    lisdexamfetamine (VYVANSE) 50 MG capsule Take 50 mg by mouth daily.    [provider]  oxyCODONE-acetaminophen (PERCOCET) 10-325 MG tablet Take 1 tablet by mouth 4 (four) times daily as needed for pain.    [provider]  phenazopyridine (PYRIDIUM) 200 MG tablet Take 1 tablet (200 mg total) by mouth 3 (three) times daily as needed for pain. 01/27/17   Tommi Rumps, PA-C  testosterone cypionate (DEPOTESTOSTERONE CYPIONATE) 200 MG/ML injection Inject 200 mg into the muscle See admin instructions. Inject 200 mg IM every 10 days 03/24/16   [provider]    Allergies Patient has no known allergies.  History reviewed. No pertinent family history.  Social History Social History  Substance Use Topics  . Smoking status: Never Smoker  . Smokeless tobacco: Current User    Types: Chew  . Alcohol use Yes    Review of Systems Constitutional: No fever/chills Cardiovascular: Denies chest pain. Respiratory: Denies shortness of breath. Gastrointestinal: No abdominal pain.  No nausea, no vomiting.  Genitourinary: Positive for dysuria. Musculoskeletal: Negative for back pain. Skin: Negative for rash. Neurological: Negative for headaches, focal weakness or numbness.   ____________________________________________   PHYSICAL EXAM:  VITAL SIGNS: ED Triage Vitals [01/27/17 1130]  Enc Vitals Group     BP (!) 156/109     Pulse Rate (!) 110     Resp 18     Temp 98 F (36.7 C)     Temp Source Oral     SpO2 97 %  Weight 210 lb (95.3 kg)     Height 6\' 1"  (1.854 m)     Head Circumference      Peak Flow      Pain Score      Pain Loc      Pain Edu?      Excl. in GC?     Constitutional: Alert and oriented. Well appearing and in no acute distress. Eyes: Conjunctivae are normal. PERRL. EOMI. Head: Atraumatic. Nose: No congestion/rhinnorhea. Neck: No stridor.   Cardiovascular: Normal rate, regular rhythm. Grossly normal heart sounds.  Good peripheral  circulation. Respiratory: Normal respiratory effort.  No retractions. Lungs CTAB. Gastrointestinal: Soft and nontender. No distention.  No CVA tenderness. Genitourinary: Genital exam with chaperone was done. No penile discharge or erythema was noted at the meatus. There is no edema present. There is some minimal diffuse tenderness on palpation of the epididymis bilaterally and generalized tenderness of the scrotal sac bilaterally. Skin is intact. There is no obvious injury to the penile shaft. Musculoskeletal: Moves upper and lower extremities without any difficulty. Normal gait was noted. Neurologic:  Normal speech and language. No gross focal neurologic deficits are appreciated.  Skin:  Skin is warm, dry and intact. No rash noted. Psychiatric: Mood and affect are normal. Speech and behavior are normal.  ____________________________________________   LABS (all labs ordered are listed, but only abnormal results are displayed)  Labs Reviewed  URINALYSIS, COMPLETE (UACMP) WITH MICROSCOPIC - Abnormal; Notable for the following:       Result Value   Color, Urine YELLOW (*)    APPearance CLEAR (*)    All other components within normal limits     RADIOLOGY FINDINGS:  Right testicle    Measurements: 4.2 x 1.7 x 2.7 cm. No mass or microlithiasis  visualized.    Left testicle    Measurements: 4.0 x 2.0 x 3.1 cm. No mass or microlithiasis  visualized.    Right epididymis: Normal in size and appearance.    Left epididymis: Normal in size and appearance.    Hydrocele: None visualized.    Varicocele: None visualized.    Pulsed Doppler interrogation of both testes demonstrates normal low  resistance arterial and venous waveforms bilaterally.    IMPRESSION:  Normal ultrasound of the testicles and scrotum.      Electronically Signed  By: Alcide CleverMark Lukens M.D.  On: 01/27/2017 14:50        ___________________________________________   PROCEDURES  Procedure(s) performed: None  Procedures  Critical Care performed: No  ____________________________________________   INITIAL IMPRESSION / ASSESSMENT AND PLAN / ED COURSE  Pertinent labs & imaging results that were available during my care of the patient were reviewed by me and considered in my medical decision making (see chart for details).   Patient presents with complaints of dysuria after having a Foley catheter in during a recent hospitalization. Bladder scan was done after patient complained that he did not feel as if he was emptying his bladder postvoiding bladder scan showed 177. Urinalysis was reassuring as well as the ultrasound that was performed. This was gone over in detail with the patient. Patient was placed on Pyridium 200 mg 1 3 times a day after meals for 3 days. He is to follow-up with his PCP for recheck of his blood pressure and any continued problems. He was also given the name of the urologist on call, Dr. Apolinar JunesBrandon.  Patient was encouraged to increase fluids. He was made aware that the medication would help with urethral  irritation but he will need to expect his urine to be discolored which is temporary while taking the medication.   ____________________________________________   FINAL CLINICAL IMPRESSION(S) / ED DIAGNOSES  Final diagnoses:  Pain  Dysuria      NEW MEDICATIONS STARTED DURING THIS VISIT:  Discharge Medication List as of 01/27/2017  3:09 PM    START taking these medications   Details  phenazopyridine (PYRIDIUM) 200 MG tablet Take 1 tablet (200 mg total) by mouth 3 (three) times daily as needed for pain., Starting Tue 01/27/2017, Print         Note:  This document was prepared using Dragon voice recognition software and may include unintentional dictation errors.    Tommi Rumps, PA-C 01/27/17 1535    Willy Eddy, MD 01/27/17 1536

## 2017-01-27 NOTE — ED Triage Notes (Signed)
Pt to ed with c/o groin pain and penile pain.  Pt states he was recently admitted and had a catheter placed.  Pt reports pain with urination, but is able to still void.

## 2017-01-27 NOTE — Telephone Encounter (Signed)
PLEASE NOTE: All timestamps contained within this report are represented as [place] Standard Time. CONFIDENTIALTY NOTICE: This fax transmission is intended only for the addressee. It contains information that is legally privileged, confidential or otherwise protected from use or disclosure. If you are not the intended recipient, you are strictly prohibited from reviewing, disclosing, copying using or disseminating any of this information or taking any action in reliance on or regarding this information. If you have received this fax in error, please notify us immediately by telephone so that we can arrange for its return to [place]. Phone: [phone], Toll-Free: [phone], Fax: [phone] Page: 1 of 2 Call Id: [id number] [facility] Day - Client TELEPHONE ADVICE RECORD [facility] Medical Call Center Patient Name: Darren Mason Gender: Male DOB: 1980/09/14 Age: 36 Y 10 M 7 D Return Phone Number: [phone] (Primary) City/State/Zip: [place] [address] Client [facility] Day - Client Client Site [facility] - Day Physician Vernona Rieger - NP Who Is Calling Patient / Member / Family / Caregiver Call Type Triage / Clinical Caller Name DorothyTigott Relationship To Patient Mother Return Phone Number [phone] (Primary) Chief Complaint Urinary Catheter Problems Reason for Call Symptomatic / Request for Health Information Initial Comment Caller states her son still has pain from catheter taken out a week ago Appointment Disposition EMR Appointment Not Necessary Info pasted into Epic Yes Nurse Assessment Nurse: Odis Luster, RN, Bjorn Loser Date/Time (Eastern Time): 01/27/2017 9:26:10 AM Confirm and document reason for call. If symptomatic, describe symptoms. ---Caller states her son still has pain from catheter taken out a week ago. Caller reports that patient is not back to his self. He is spending a lot of time on the couch, due  to weakness. Having muscle pain and using ice. 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 Asked caller for patient's phone number to speak directly to him: [phone]. Please document clinical information provided and list any resource used. ---Called patient and got voicemail. Will attempt to reach patient once again. Nurse: Odis Luster, RN, Bjorn Loser Date/Time (Eastern Time): 01/27/2017 9:50:28 AM Confirm and document reason for call. If symptomatic, describe symptoms. ---Cath was removed last Tuesday, last few days has been having pain with urination and constant pain from the base of the penis to the tip. Appears slightly swollen. Does the PT have any chronic conditions? (i.e. diabetes, asthma, etc.) ---Yes List chronic conditions. ---back pain; Guidelines Guideline Title Affirmed Question Scrotal Pain [1] Constant pain in scrotum or testicle AND [2] present > 1 hour Disp. Time Lamount Cohen Time) Disposition Final User 01/27/2017 9:55:34 AM Go to ED Now Yes Odis Luster, RN, Rhonda Referrals [facility] - ED PLEASE NOTE: All timestamps contained within this report are represented as [place] Standard Time. CONFIDENTIALTY NOTICE: This fax transmission is intended only for the addressee. It contains information that is legally privileged, confidential or otherwise protected from use or disclosure. If you are not the intended recipient, you are strictly prohibited from reviewing, disclosing, copying using or disseminating any of this information or taking any action in reliance on or regarding this information. If you have received this fax in error, please notify us immediately by telephone so that we can arrange for its return to [place]. Phone: [phone], Toll-Free: [phone], Fax: [phone] Page: 2 of 2 Call Id: [id number] Care Advice Given Per Guideline GO TO ED NOW: You need  to be  seen in the Emergency Department. Go to the ER at ___________ Hospital. Leave now. Drive carefully. NOTHING BY MOUTH: Do not eat or drink anything for now. (Reason: condition may need surgery and general anesthesia.) CARE ADVICE given per Scrotal Pain (Adult) guideline.

## 2017-01-27 NOTE — ED Notes (Signed)
Bladder scan per order for 177 ml.

## 2017-01-27 NOTE — Telephone Encounter (Signed)
Noted, emergency department note is not ready for view, patient just arrived.

## 2017-01-27 NOTE — Telephone Encounter (Signed)
This team health note already routed to Mayra ReelKate Clark NP thru [facility]. Per chart review tab pt went to [facility] ED. FYI to Mayra ReelKate Clark NP.

## 2017-01-27 NOTE — Discharge Instructions (Signed)
Begin taking Pyridium 200 mg 3 times a day after meals for urinary symptoms. Remember this medication will cause discoloration of the urine.  Increase fluids. Make an appointment with your primary care doctor to have your blood pressure rechecked as it was slightly elevated while in the emergency department. Also the name of a urologist that is on call today is listed on your discharge papers. If you continue to have any urological concerns call to make an appointment.

## 2017-01-27 NOTE — Telephone Encounter (Signed)
[facility] Day - Client TELEPHONE ADVICE RECORD [facility] Call Center  Patient Name: Darren Mason  DOB: 1980/12/31    Initial Comment Caller states her son still has pain from catheter taken out a week ago    Nurse Assessment  Nurse: Odis LusterBowers, RN, Bjorn Loserhonda Date/Time (Eastern Time): 01/27/2017 9:26:10 AM  Confirm and document reason for call. If symptomatic, describe symptoms. ---Caller states her son still has pain from catheter taken out a week ago. Caller reports that patient is not back to his self. He is spending a lot of time on the couch, due to weakness. Having muscle pain and using ice. 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 Asked caller for patient's phone number to speak directly to him: [phone].  Does the patient have any new or worsening symptoms? ---No  Please document clinical information provided and list any resource used. ---Called patient and got voicemail. Will attempt to reach patient once again.    Nurse: Odis LusterBowers, RN, Bjorn Loserhonda Date/Time (Eastern Time): 01/27/2017 9:50:28 AM  Confirm and document reason for call. If symptomatic, describe symptoms. ---Cath was removed last Tuesday, last few days has been having pain with urination and constant pain from the base of the penis to the tip. Appears slightly swollen.   Does the patient have any new or worsening symptoms? ---Yes   Will a triage be completed? ---Yes   Related visit to physician within the last 2 weeks? ---Yes   Does the PT have any chronic conditions? (i.e. diabetes, asthma, etc.) ---Yes   List chronic conditions. ---back pain;   Is this a behavioral health or substance abuse call? ---No      Guidelines    Guideline Title Affirmed Question Affirmed Notes  Scrotal Pain [1] Constant pain in scrotum or testicle AND [2] present > 1 hour    Final Disposition User   Go to ED Now Odis LusterBowers, RN,  Bjorn Loserhonda    Referrals  [facility] - ED   Disagree/Comply: Comply

## 2017-01-27 NOTE — ED Notes (Signed)
See triage note  States he had a foley cath in place recently   Now having pain at base of penis and moves into tip of penis  Voiding small amts

## 2017-02-22 NOTE — Progress Notes (Signed)
02/23/2017 3:52 PM   Darren Mason 05-29-1981 [id number]  Referring provider: Doreene Nest, NP [address] [place], [place] [address]  Chief Complaint  Patient presents with  . New Patient (Initial Visit)    Dysuria referred by ER    HPI: Patient is a 36 year old Caucasian male who is referred by [facility] ED for dysuria and groin pain.    UA was unremarkable in the ED.  Scrotal ultrasound performed on 01/27/2017 was negative.  He is having frequency x 10 and nocturia x 3.  The urge is strong.  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   He states that his symptoms occurred after the catheter was removed.  He states the aide "tripped" over the catheter and tugged on him.  He has not had gross hematuria.    His UA was negative.  His PVR was 0 mL.  Pyridium helped the frequency.  Nothing helps the frequency.    He is not having suprapubic pain.  He has not had any fevers, chills, nausea or vomiting.      He has also noted some ED since the Foley.    He is also being treated for back pain at this time.  He has disc protrusion at L4-5.  He is currently on Soma, oxycodone and diclofenac for his back pain.   He also received an epidural three weeks ago.    He states that he has a history of meatal stenosis as a child.  He is taking testosterone injections through his PCP.    Reviewed referral notes.    PMH: Past Medical History:  Diagnosis Date  . ADHD   . Anxiety and depression   . Chronic back pain   . Osteoarthritis   . Serotonin syndrome   . Testosterone deficiency     Surgical History: Past Surgical History:  Procedure Laterality Date  . APPENDECTOMY  2001    Home Medications:  Allergies as of 02/23/2017   No Known Allergies     Medication List       Accurate as of 02/23/17  3:52 PM. Always use your most recent med list.          buPROPion 75 MG tablet Commonly  known as:  WELLBUTRIN Take 75 mg by mouth 2 (two) times daily.   carisoprodol 350 MG tablet Commonly known as:  SOMA Take 350 mg by mouth 3 (three) times daily as needed.   diclofenac 75 MG EC tablet Commonly known as:  VOLTAREN Take 75 mg by mouth 2 (two) times daily.   fesoterodine 4 MG Tb24 tablet Commonly known as:  TOVIAZ Take 1 tablet (4 mg total) by mouth daily.   lisdexamfetamine 50 MG capsule Commonly known as:  VYVANSE Take 50 mg by mouth daily.   oxyCODONE-acetaminophen 10-325 MG tablet Commonly known as:  PERCOCET Take 1 tablet by mouth 4 (four) times daily as needed for pain.   phenazopyridine 200 MG tablet Commonly known as:  PYRIDIUM Take 1 tablet (200 mg total) by mouth 3 (three) times daily as needed for pain.   testosterone cypionate 200 MG/ML injection Commonly known as:  DEPOTESTOSTERONE CYPIONATE Inject 200 mg into the muscle See admin instructions. Inject 200 mg IM every 10 days       Allergies: No Known Allergies  Family History: Family History  Problem Relation Age of Onset  . Colon  cancer Maternal Grandmother   . Prostate cancer Neg Hx   . Kidney cancer Neg Hx   . Bladder Cancer Neg Hx     Social History:  reports that he has never smoked. His smokeless tobacco use includes Chew. He reports that he drinks alcohol. He reports that he does not use drugs.  ROS: UROLOGY Frequent Urination?: Yes Hard to postpone urination?: Yes Burning/pain with urination?: No Get up at night to urinate?: Yes Leakage of urine?: No Urine stream starts and stops?: No Trouble starting stream?: No Do you have to strain to urinate?: No Blood in urine?: No Urinary tract infection?: No Sexually transmitted disease?: No Injury to kidneys or bladder?: No Painful intercourse?: No Weak stream?: No Erection problems?: No Penile pain?: No  Gastrointestinal Nausea?: Yes Vomiting?: No Indigestion/heartburn?: No Diarrhea?: No Constipation?:  No  Constitutional Fever: No Night sweats?: No Weight loss?: Yes Fatigue?: Yes  Skin Skin rash/lesions?: No Itching?: No  Eyes Blurred vision?: No Double vision?: No  Ears/Nose/Throat Sore throat?: No Sinus problems?: No  Hematologic/Lymphatic Swollen glands?: No Easy bruising?: No  Cardiovascular Leg swelling?: No Chest pain?: No  Respiratory Cough?: No Shortness of breath?: No  Endocrine Excessive thirst?: No  Musculoskeletal Back pain?: Yes Joint pain?: Yes  Neurological Headaches?: Yes Dizziness?: No  Psychologic Depression?: Yes Anxiety?: Yes  Physical Exam: BP (!) 154/115   Pulse (!) 116   Ht 6\' 1"  (1.854 m)   Wt 221 lb 4.8 oz (100.4 kg)   BMI 29.20 kg/m   Constitutional: Well nourished. Alert and oriented, No acute distress. HEENT: [place] AT, moist mucus membranes. Trachea midline, no masses. Cardiovascular: No clubbing, cyanosis, or edema. Respiratory: Normal respiratory effort, no increased work of breathing. GI: Abdomen is soft, non tender, non distended, no abdominal masses. Liver and spleen not palpable.  No hernias appreciated.  Stool sample for occult testing is not indicated.   GU: No CVA tenderness.  No bladder fullness or masses.  Patient with uncircumcised phallus.  Foreskin easily retracted  Urethral meatus is patent.  No penile discharge. No penile lesions or rashes. Scrotum without lesions, cysts, rashes and/or edema.  Testicles are located scrotally bilaterally. No masses are appreciated in the testicles. Left and right epididymis are normal. Rectal: Patient with  normal sphincter tone. Anus and perineum without scarring or rashes. No rectal masses are appreciated. Prostate is approximately 45 grams, no nodules are appreciated. Seminal vesicles are normal. Skin: No rashes, bruises or suspicious lesions. Lymph: No cervical or inguinal adenopathy. Neurologic: Grossly intact, no focal deficits, moving all 4 extremities. Psychiatric:  Normal mood and affect.  Laboratory Data: Lab Results  Component Value Date   WBC 10.5 01/20/2017   HGB 13.9 01/20/2017   HCT 42.0 01/20/2017   MCV 90.5 01/20/2017   PLT 201 01/20/2017    Lab Results  Component Value Date   CREATININE 1.24 01/23/2017    Lab Results  Component Value Date   AST 33 01/23/2017   Lab Results  Component Value Date   ALT 71 (H) 01/23/2017   Urinalysis Unremarkable.  See EPIC.   I have reviewed the labs.   Pertinent Imaging: CLINICAL DATA:  Penile and scrotal pain following catheterization several days ago  EXAM: SCROTAL ULTRASOUND  DOPPLER ULTRASOUND OF THE TESTICLES  TECHNIQUE: Complete ultrasound examination of the testicles, epididymis, and other scrotal structures was performed. Color and spectral Doppler ultrasound were also utilized to evaluate blood flow to the testicles.  COMPARISON:  None.  FINDINGS: Right  testicle  Measurements: 4.2 x 1.7 x 2.7 cm. No mass or microlithiasis visualized.  Left testicle  Measurements: 4.0 x 2.0 x 3.1 cm. No mass or microlithiasis visualized.  Right epididymis:  Normal in size and appearance.  Left epididymis:  Normal in size and appearance.  Hydrocele:  None visualized.  Varicocele:  None visualized.  Pulsed Doppler interrogation of both testes demonstrates normal low resistance arterial and venous waveforms bilaterally.  IMPRESSION: Normal ultrasound of the testicles and scrotum.   Electronically Signed   By: Alcide Clever M.D.   On: 01/27/2017 14:50 I have independently reviewed the films.    Assessment & Plan:    1. Frequency  - may be due to his disc protrusion  - will have a trial of Toviaz 4 mg daily -  I have advised him of the side effects of Toviaz , such as: Dry eyes, dry mouth, constipation, mental confusion and/or urinary retention.  - RTC in 3 weeks for I PSS and PVR  2. Nocturia  - see above  3. ED  - advised the patient to see PCP  to rule out CAD   Return in about 3 weeks (around 03/16/2017) for IPSS and PVR.  These notes generated with voice recognition software. I apologize for typographical errors.  Michiel Cowboy, PA-C  [facility] [address] [place], [place] [address] [phone]

## 2017-02-23 ENCOUNTER — Encounter: Payer: Self-pay | Admitting: Urology

## 2017-02-23 ENCOUNTER — Ambulatory Visit (INDEPENDENT_AMBULATORY_CARE_PROVIDER_SITE_OTHER): Payer: 59 | Admitting: Urology

## 2017-02-23 VITALS — BP 154/115 | HR 116 | Ht 73.0 in | Wt 221.3 lb

## 2017-02-23 DIAGNOSIS — R300 Dysuria: Secondary | ICD-10-CM

## 2017-02-23 DIAGNOSIS — N529 Male erectile dysfunction, unspecified: Secondary | ICD-10-CM | POA: Diagnosis not present

## 2017-02-23 DIAGNOSIS — R351 Nocturia: Secondary | ICD-10-CM

## 2017-02-23 DIAGNOSIS — R350 Frequency of micturition: Secondary | ICD-10-CM

## 2017-02-23 LAB — MICROSCOPIC EXAMINATION
EPITHELIAL CELLS (NON RENAL): NONE SEEN /HPF (ref 0–10)
RBC, UA: NONE SEEN /hpf (ref 0–?)
WBC, UA: NONE SEEN /hpf (ref 0–?)

## 2017-02-23 LAB — URINALYSIS, COMPLETE
BILIRUBIN UA: NEGATIVE
Glucose, UA: NEGATIVE
LEUKOCYTES UA: NEGATIVE
Nitrite, UA: NEGATIVE
RBC UA: NEGATIVE
Urobilinogen, Ur: 0.2 mg/dL (ref 0.2–1.0)
pH, UA: 5.5 (ref 5.0–7.5)

## 2017-02-23 LAB — BLADDER SCAN AMB NON-IMAGING: Scan Result: 0

## 2017-02-23 MED ORDER — FESOTERODINE FUMARATE ER 4 MG PO TB24: 4.0000 mg | ORAL_TABLET | Freq: Every day | ORAL | 11 refills | Status: DC

## 2017-02-25 LAB — GC/CHLAMYDIA PROBE AMP
CHLAMYDIA, DNA PROBE: NEGATIVE
NEISSERIA GONORRHOEAE BY PCR: NEGATIVE

## 2017-02-28 LAB — CULTURE, URINE COMPREHENSIVE

## 2017-03-18 NOTE — Progress Notes (Signed)
03/19/2017 2:47 PM   Darren Mason 1980/07/17 [id number]  Referring provider: Doreene Nest, NP [address] [place], [place] [address]  Chief Complaint  Patient presents with  . Follow-up    3 weeks Frequency/ ED/ Nocturia    HPI: 36 yo WM with frequency, nocturia and ED who presents today for a 3 week follow up after a trial of Toviaz 4 mg daily.  Background history Patient is a 36 year old Caucasian male who is referred by [facility] ED for dysuria and groin pain.  UA was unremarkable in the ED.  Scrotal ultrasound performed on 01/27/2017 was negative.  He is having frequency x 10 and nocturia x 3.  The urge is strong.  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   He states that his symptoms occurred after the catheter was removed.  He states the aide "tripped" over the catheter and tugged on him.  He has not had gross hematuria.  His UA was negative.  His PVR was 0 mL.  Pyridium helped the frequency.  Nothing helps the frequency.  He is not having suprapubic pain.  He has not had any fevers, chills, nausea or vomiting.  He has also noted some ED since the Foley.  He is also being treated for back pain at this time.  He has disc protrusion at L4-5.  He is currently on Soma, oxycodone and diclofenac for his back pain.   He also received an epidural three weeks ago.  He states that he has a history of meatal stenosis as a child.  He is taking testosterone injections through his PCP.    His IPSS score today is 19, which is moderate lower urinary tract symptomatology. He is mixed with his quality life due to his urinary symptoms. His PVR is 11 mL.  His previous PVR is 0 mL.    His major complaints today are frequency, urgency, nocturia, intermittency, hesitancy, straining to urinate and a weak urinary stream..  He has had these symptoms for two months.  He denies any dysuria, hematuria or suprapubic  pain.   He currently taking Toviaz 4 mg daily.  He feels the Gala Murdoch helped somewhat.  He mentions that his primary care physician located in [place] stated that his liver and kidney functions were elevated. She is wanting to get imaging studies for further evaluation.  He also denies any recent fevers, chills, nausea or vomiting.  He does not have a family history of PCa.      IPSS    Row Name 03/19/17 1600         International Prostate Symptom Score   How often have you had the sensation of not emptying your bladder? Less than half the time     How often have you had to urinate less than every two hours? About half the time     How often have you found you stopped and started again several times when you urinated? More than half the time     How often have you found it difficult to postpone urination? Almost always     How often have you had a weak urinary stream? Not at All     How often have you had to strain to start urination? About half the time     How many times did you typically get up at night to urinate? 2 Times  Total IPSS Score 19       Quality of Life due to urinary symptoms   If you were to spend the rest of your life with your urinary condition just the way it is now how would you feel about that? Mixed        Score:  1-7 Mild 8-19 Moderate 20-35 Severe    PMH: Past Medical History:  Diagnosis Date  . ADHD   . Anxiety and depression   . Chronic back pain   . Osteoarthritis   . Serotonin syndrome   . Testosterone deficiency     Surgical History: Past Surgical History:  Procedure Laterality Date  . APPENDECTOMY  2001    Home Medications:  Allergies as of 03/19/2017   No Known Allergies     Medication List       Accurate as of 03/19/17 11:59 PM. Always use your most recent med list.          buPROPion 75 MG tablet Commonly known as:  WELLBUTRIN Take 75 mg by mouth 2 (two) times daily.   carisoprodol 350 MG tablet Commonly known  as:  SOMA Take 350 mg by mouth 3 (three) times daily as needed.   diclofenac 75 MG EC tablet Commonly known as:  VOLTAREN Take 75 mg by mouth 2 (two) times daily.   fesoterodine 4 MG Tb24 tablet Commonly known as:  TOVIAZ Take 1 tablet (4 mg total) by mouth daily.   lisdexamfetamine 50 MG capsule Commonly known as:  VYVANSE Take 50 mg by mouth daily.   oxyCODONE-acetaminophen 10-325 MG tablet Commonly known as:  PERCOCET Take 1 tablet by mouth 4 (four) times daily as needed for pain.   phenazopyridine 200 MG tablet Commonly known as:  PYRIDIUM Take 1 tablet (200 mg total) by mouth 3 (three) times daily as needed for pain.   sildenafil 20 MG tablet Commonly known as:  REVATIO Take 3 to 5 tablets two hours before intercouse on an empty stomach.  Do not take with nitrates.   testosterone cypionate 200 MG/ML injection Commonly known as:  DEPOTESTOSTERONE CYPIONATE Inject 200 mg into the muscle See admin instructions. Inject 200 mg IM every 10 days            Discharge Care Instructions        Start     Ordered   03/19/17 0000  BLADDER SCAN AMB NON-IMAGING     03/19/17 1629   03/19/17 0000  sildenafil (REVATIO) 20 MG tablet    Question:  Supervising Provider  Answer:  Darren Mason, Darren Mason   03/19/17 1700      Allergies: No Known Allergies  Family History: Family History  Problem Relation Age of Onset  . Colon cancer Maternal Grandmother   . Prostate cancer Neg Hx   . Kidney cancer Neg Hx   . Bladder Cancer Neg Hx     Social History:  reports that he has never smoked. His smokeless tobacco use includes Chew. He reports that he drinks alcohol. He reports that he does not use drugs.  ROS: UROLOGY Frequent Urination?: Yes Hard to postpone urination?: Yes Burning/pain with urination?: No Get up at night to urinate?: Yes Leakage of urine?: No Urine stream starts and stops?: Yes Trouble starting stream?: Yes Do you have to strain to urinate?: Yes Blood in  urine?: No Urinary tract infection?: No Sexually transmitted disease?: No Injury to kidneys or bladder?: No Painful intercourse?: No Weak stream?: Yes Erection problems?: No Penile pain?: No  Gastrointestinal Nausea?: No Vomiting?: No Indigestion/heartburn?: Yes Diarrhea?: No Constipation?: No  Constitutional Fever: No Night sweats?: No Weight loss?: No Fatigue?: No  Skin Skin rash/lesions?: No Itching?: No  Eyes Blurred vision?: No Double vision?: No  Ears/Nose/Throat Sore throat?: No Sinus problems?: No  Hematologic/Lymphatic Swollen glands?: No Easy bruising?: No  Cardiovascular Leg swelling?: No Chest pain?: No  Respiratory Cough?: No Shortness of breath?: No  Endocrine Excessive thirst?: No  Musculoskeletal Back pain?: Yes Joint pain?: Yes  Neurological Headaches?: Yes Dizziness?: No  Psychologic Depression?: Yes Anxiety?: No  Physical Exam: BP (!) 145/90   Pulse (!) 134   Ht  (1.854 m)   Wt 221 lb 11.2 oz (100.6 kg)   BMI 29.25 kg/m   Constitutional: Well nourished. Alert and oriented, No acute distress. HEENT: [place] AT, moist mucus membranes. Trachea midline, no masses. Cardiovascular: No clubbing, cyanosis, or edema. Respiratory: Normal respiratory effort, no increased work of breathing. Skin: No rashes, bruises or suspicious lesions. Lymph: No cervical or inguinal adenopathy. Neurologic: Grossly intact, no focal deficits, moving all 4 extremities. Psychiatric: Normal mood and affect.  Laboratory Data: Lab Results  Component Value Date   WBC 10.5 01/20/2017   HGB 13.9 01/20/2017   HCT 42.0 01/20/2017   MCV 90.5 01/20/2017   PLT 201 01/20/2017    Lab Results  Component Value Date   CREATININE 1.24 01/23/2017    Lab Results  Component Value Date   AST 33 01/23/2017   Lab Results  Component Value Date   ALT 71 (H) 01/23/2017   I have reviewed the labs.   Pertinent Imaging: Results for JACOREY, DONAWAY (MRN  [id number]) as of 03/20/2017 14:47  Ref. Range 03/19/2017 16:38  Scan Result Unknown 11     I have independently reviewed the films.    Assessment & Plan:    1. Frequency  - may be due to his disc protrusion  - feels that the Gala Murdoch has helped somewhat  2. Nocturia  - see above  3. ED  - script given for sildenafil  - Sildenafil 20 mg, 3 to 5 tablets two hours prior to intercourse on an empty stomach, # 50; he is warned not to take medications that contain nitrates.  I also advised him of the side effects, such as: headache, flushing, dyspepsia, abnormal vision, nasal congestion, back pain, myalgia, nausea, dizziness, and rash.  4. Elevated liver and kidney blood work  - per patient - will obtain records  - await imaging study results   Return for pending records.  These notes generated with voice recognition software. I apologize for typographical errors.  Michiel Cowboy, PA-C  [facility] [address] [place], [place] [address] [phone]

## 2017-03-19 ENCOUNTER — Encounter: Payer: Self-pay | Admitting: Urology

## 2017-03-19 ENCOUNTER — Ambulatory Visit: Payer: 59 | Admitting: Urology

## 2017-03-19 VITALS — BP 145/90 | HR 134 | Ht 73.0 in | Wt 221.7 lb

## 2017-03-19 DIAGNOSIS — N529 Male erectile dysfunction, unspecified: Secondary | ICD-10-CM | POA: Diagnosis not present

## 2017-03-19 DIAGNOSIS — R350 Frequency of micturition: Secondary | ICD-10-CM

## 2017-03-19 DIAGNOSIS — R351 Nocturia: Secondary | ICD-10-CM

## 2017-03-19 LAB — BLADDER SCAN AMB NON-IMAGING: Scan Result: 11

## 2017-03-19 MED ORDER — SILDENAFIL CITRATE 20 MG PO TABS: ORAL_TABLET | ORAL | 3 refills | Status: DC

## 2017-03-31 ENCOUNTER — Other Ambulatory Visit: Payer: Self-pay

## 2017-04-05 ENCOUNTER — Telehealth: Payer: Self-pay | Admitting: Urology

## 2017-04-05 NOTE — Telephone Encounter (Signed)
Please let Darren Mason know that I have received the labwork.  He is being referred to a nephrologist for further evaluation of his kidneys.  He was also to get blood work to screen for hepatitis and an ultrasound of his liver.  He sure make sure he gets the ultrasound, sees the nephrologist and the blood work to screen for hepatitis.    No urological intervention regarding these issues is needed at this time.

## 2017-04-06 NOTE — Telephone Encounter (Signed)
Spoke with pt in reference to [employer]. Made aware will need labs and u/s. Pt voiced understanding. Have u/s orders been placed?

## 2017-04-06 NOTE — Telephone Encounter (Signed)
His liver ultrasound has to ordered by his PCP.

## 2017-04-06 NOTE — Telephone Encounter (Signed)
LMOM for patient to call office back. 

## 2017-05-07 HISTORY — PX: BACK SURGERY: SHX140

## 2017-09-30 ENCOUNTER — Other Ambulatory Visit: Payer: Self-pay | Admitting: Urology

## 2017-12-29 ENCOUNTER — Other Ambulatory Visit: Payer: Self-pay | Admitting: Urology

## 2017-12-30 ENCOUNTER — Telehealth: Payer: Self-pay | Admitting: Urology

## 2017-12-30 NOTE — Telephone Encounter (Signed)
Please ask Mr. Perfect if he has seen nephrology and if he had his liver ultrasound by his PCP?

## 2018-01-01 ENCOUNTER — Ambulatory Visit (INDEPENDENT_AMBULATORY_CARE_PROVIDER_SITE_OTHER): Payer: 59 | Admitting: Psychology

## 2018-01-01 DIAGNOSIS — F411 Generalized anxiety disorder: Secondary | ICD-10-CM

## 2018-01-01 NOTE — Telephone Encounter (Signed)
Called pt [facility]. Pt gave verbal understanding.

## 2018-01-01 NOTE — Telephone Encounter (Signed)
Pt states he has not done either of these. However, he has had blood work for his kidneys and liver and it seemed normal. Liver levels checked 2x, first time was abnormal, second time normal. He states if you think it is necessary he will have these done. Please advise.

## 2018-01-01 NOTE — Telephone Encounter (Signed)
I think we are okay for now.

## 2018-01-11 ENCOUNTER — Other Ambulatory Visit: Payer: Self-pay | Admitting: Urology

## 2018-01-15 ENCOUNTER — Ambulatory Visit: Payer: 59 | Admitting: Psychology

## 2018-01-15 DIAGNOSIS — F411 Generalized anxiety disorder: Secondary | ICD-10-CM

## 2018-01-29 ENCOUNTER — Ambulatory Visit: Payer: 59 | Admitting: Psychology

## 2018-01-29 DIAGNOSIS — F411 Generalized anxiety disorder: Secondary | ICD-10-CM

## 2018-02-10 ENCOUNTER — Ambulatory Visit: Payer: 59 | Admitting: Psychology

## 2018-02-10 DIAGNOSIS — F411 Generalized anxiety disorder: Secondary | ICD-10-CM

## 2018-03-05 ENCOUNTER — Ambulatory Visit: Payer: 59 | Admitting: Psychology

## 2018-03-05 DIAGNOSIS — F411 Generalized anxiety disorder: Secondary | ICD-10-CM

## 2018-03-17 ENCOUNTER — Ambulatory Visit: Payer: 59 | Admitting: Psychology

## 2018-03-17 DIAGNOSIS — F411 Generalized anxiety disorder: Secondary | ICD-10-CM

## 2018-04-16 ENCOUNTER — Ambulatory Visit: Payer: 59 | Admitting: Psychology

## 2018-04-16 DIAGNOSIS — F411 Generalized anxiety disorder: Secondary | ICD-10-CM

## 2018-05-21 ENCOUNTER — Ambulatory Visit (INDEPENDENT_AMBULATORY_CARE_PROVIDER_SITE_OTHER): Payer: 59 | Admitting: Psychology

## 2018-05-21 DIAGNOSIS — F411 Generalized anxiety disorder: Secondary | ICD-10-CM

## 2018-05-24 ENCOUNTER — Other Ambulatory Visit: Payer: Self-pay | Admitting: Urology

## 2018-05-26 ENCOUNTER — Other Ambulatory Visit: Payer: Self-pay | Admitting: Urology

## 2018-05-31 ENCOUNTER — Other Ambulatory Visit: Payer: Self-pay | Admitting: Urology

## 2018-06-01 ENCOUNTER — Ambulatory Visit: Payer: 59 | Admitting: Psychology

## 2018-06-01 DIAGNOSIS — F411 Generalized anxiety disorder: Secondary | ICD-10-CM

## 2018-06-07 ENCOUNTER — Other Ambulatory Visit: Payer: Self-pay | Admitting: Family Medicine

## 2018-06-11 ENCOUNTER — Encounter: Payer: Self-pay | Admitting: Internal Medicine

## 2018-06-14 NOTE — Progress Notes (Signed)
06/15/2018  1:00 PM   Darren Mason [birth year] [id number]  Referring provider: Doreene Nest, NP [address] [place], [place] [address]  Chief Complaint  Patient presents with  . Follow-up    HPI: Darren Mason is a 37 y.o. Caucasion male presenting today for his annual follow up. He has a history of  frequency, nocturia and ED.  Frequency His IPSS score today is 11/2, which is moderate urological symptomology. He is mostly satisfied with his quality of life due to his urinary symptoms. His previous was 19, with mixed feelings about his quality life due to his urinary symptoms. His PVR is 24 mL.  His previous PVR is 11 mL.    He reports that he is still experiencing frequency and nocturia 1-2x (higher end usually due to fluid intake immediately prior to bedtime).   Denies hematuria, dysuria Denies fever, chills, nausea, and vomitting.  He feelt the Gala Murdoch was helpful although he has tapered off in his usage due to the medications costs and is managing his urinary symptoms conservatively.    IPSS    Row Name 06/17/18 1200         International Prostate Symptom Score   How often have you had the sensation of not emptying your bladder?  Less than 1 in 5     How often have you had to urinate less than every two hours?  Less than half the time     How often have you found you stopped and started again several times when you urinated?  Less than half the time     How often have you found it difficult to postpone urination?  About half the time     How often have you had a weak urinary stream?  Less than 1 in 5 times     How often have you had to strain to start urination?  Less than 1 in 5 times     How many times did you typically get up at night to urinate?  1 Time     Total IPSS Score  11       Quality of Life due to urinary symptoms   If you were to spend the rest of your life with your urinary condition just the way it is now how would you feel about that?  Mostly  Satisfied        Score:  1-7 Mild 8-19 Moderate 20-35 Severe  Erectile Dysfunction SHIM    Row Name 06/15/18 1638         SHIM: Over the last 6 months:   How do you rate your confidence that you could get and keep an erection?  High     When you had erections with sexual stimulation, how often were your erections hard enough for penetration (entering your partner)?  Almost Always or Always     During sexual intercourse, how often were you able to maintain your erection after you had penetrated (entered) your partner?  Almost Always or Always     During sexual intercourse, how difficult was it to maintain your erection to completion of intercourse?  Not Difficult     When you attempted sexual intercourse, how often was it satisfactory for you?  Almost Always or Always       SHIM Total Score   SHIM  24        His SHIM score is 24 with medication, which is considered no ED.  He noted some  ED since the Foley over one year ago.  He is still taking Sidenafil (3-5). He denies curved erection and reports experiencing spontaneous erections at night (usually following testosterone injections). He states that he has a history of meatal stenosis as a child.  He is taking testosterone injections through his PCP though he self administers.    He believes stress and anxiety compounded with his severe back problems are causes for his erectile dysfunction.  Background history Patient was originally referred by [facility] ED for dysuria and groin pain.  UA was unremarkable in the ED.  Scrotal ultrasound performed on 01/27/2017 was negative.  At that time he was experiencing frequency x 10, nocturia x 3, and strong urgency. 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  He expressed that his symptoms occurred after the catheter was removed.  He reported the aide "tripped" over the catheter and tugged on him.  He had not  experienced gross hematuria.  His UA was negative. He has attempted Pyridium for the frequency. He denied suprapubic pain, any fevers, chills, nausea or vomiting.  He also noted some ED since the Foley.  He had a disc protrusion at L4-5 that was treated with Soma, oxycodone and diclofenac for back pain. He takes back injections for the pain and has had back surgery. He does not have a family history of PCa.  PMH: Past Medical History:  Diagnosis Date  . ADHD   . Anxiety and depression   . Chronic back pain   . Osteoarthritis   . Serotonin syndrome   . Testosterone deficiency     Surgical History: Past Surgical History:  Procedure Laterality Date  . APPENDECTOMY  2001    Home Medications:  Allergies as of 06/15/2018   No Known Allergies     Medication List       Accurate as of June 15, 2018 11:59 PM. Always use your most recent med list.        buPROPion 75 MG tablet Commonly known as:  WELLBUTRIN Take 75 mg by mouth 2 (two) times daily.   carisoprodol 350 MG tablet Commonly known as:  SOMA Take 350 mg by mouth 3 (three) times daily as needed.   diclofenac 75 MG EC tablet Commonly known as:  VOLTAREN Take 75 mg by mouth 2 (two) times daily.   fesoterodine 4 MG Tb24 tablet Commonly known as:  TOVIAZ Take 1 tablet (4 mg total) by mouth daily.   lisdexamfetamine 50 MG capsule Commonly known as:  VYVANSE Take 50 mg by mouth daily.   oxyCODONE-acetaminophen 10-325 MG tablet Commonly known as:  PERCOCET Take 1 tablet by mouth 4 (four) times daily as needed for pain.   phenazopyridine 200 MG tablet Commonly known as:  PYRIDIUM Take 1 tablet (200 mg total) by mouth 3 (three) times daily as needed for pain.   sildenafil 20 MG tablet Commonly known as:  REVATIO TAKE 3-5 TABLETS BY MOUTH 2 HRS BEFORE INTERCOURSE ON EMPTY STOMACH. DO NOT TAKE WITH NITRATES   testosterone cypionate 200 MG/ML injection Commonly known as:  DEPOTESTOSTERONE CYPIONATE Inject 200  mg into the muscle See admin instructions. Inject 200 mg IM every 10 days       Allergies: No Known Allergies  Family History: Family History  Problem Relation Age of Onset  . Colon cancer Maternal Grandmother   . Prostate cancer Neg Hx   . Kidney cancer Neg Hx   .  Bladder Cancer Neg Hx    Social History:  reports that he has never smoked. His smokeless tobacco use includes chew. He reports current alcohol use. He reports that he does not use drugs.  ROS: UROLOGY Frequent Urination?: No Hard to postpone urination?: Yes Burning/pain with urination?: No Get up at night to urinate?: Yes Leakage of urine?: No Urine stream starts and stops?: No Trouble starting stream?: No Do you have to strain to urinate?: No Blood in urine?: No Urinary tract infection?: No Sexually transmitted disease?: No Injury to kidneys or bladder?: No Painful intercourse?: No Weak stream?: No Erection problems?: Yes Penile pain?: No  Gastrointestinal Nausea?: No Vomiting?: No Indigestion/heartburn?: No Diarrhea?: No Constipation?: No  Constitutional Fever: No Night sweats?: No Weight loss?: No Fatigue?: No  Skin Skin rash/lesions?: No Itching?: No  Eyes Blurred vision?: No Double vision?: No  Ears/Nose/Throat Sore throat?: No Sinus problems?: No  Hematologic/Lymphatic Swollen glands?: No Easy bruising?: No  Cardiovascular Leg swelling?: No Chest pain?: No  Respiratory Cough?: No Shortness of breath?: No  Endocrine Excessive thirst?: No  Musculoskeletal Back pain?: Yes Joint pain?: Yes  Neurological Headaches?: Yes Dizziness?: No  Psychologic Depression?: Yes Anxiety?: Yes  Physical Exam: BP (!) 141/83   Pulse 100   Ht 6\' 1"  (1.854 m)   Wt 221 lb (100.2 kg)   BMI 29.16 kg/m   Constitutional: Well nourished. Alert and oriented, No acute distress. Respiratory: Normal respiratory effort, no increased work of breathing. Skin: No rashes, bruises or  suspicious lesions. Neurologic: Grossly intact, no focal deficits, moving all 4 extremities. Psychiatric: Normal mood and affect. GU: No CVA tenderness.  No bladder fullness or masses.  Patient with uncircumcised phallus. Foreskin easily retracted.  Urethral meatus is patent.  No penile discharge. No penile lesions or rashes. Scrotum without lesions, cysts, rashes and/or edema.  Testicles are located scrotally bilaterally. No masses are appreciated in the testicles. Left and right epididymis are normal.  Pertinent Imaging: Results for Darren CoombesBOWEN, [place] (MRN [id number]) as of 06/14/2018 11:53  Ref. Range 02/23/2017 15:23 03/19/2017 16:38 06/15/2018 16:02  Scan Result Unknown 0 11 24 ml   I have independently reviewed the films.    Assessment & Plan:    1. Frequency  - may be due to his disc protrusion  - feels that the Gala Murdochoviaz was helpful in alleviating his symptoms. But will not continue due to cost.  - Patient feels his symptoms are manageable without medication at this time.  - PVR today was 24 ml   2. Nocturia  - see above  3. ED  - Patient reports improved symptoms with medicaton. Continue Sildenafil.  - Currently taking Testosterone injections that are managed through his PCP  Return in about 1 year (around 06/16/2019) for IPSS, SHIM and exam.  These notes generated with voice recognition software. I apologize for typographical errors.  Hulan FrayMcGowan, Cheryllynn Sarff A, PA-C   [facility] [address] [place], [place] [address] [phone]  I, Temidayo Atanda-Ogunleye , am acting as a scribe for Harle BattiestMcGowan, Kaneisha Ellenberger A, PA-C  I have reviewed the above documentation for accuracy and completeness, and I agree with the above.    Michiel CowboyShannon Keyon Winnick, PA-C

## 2018-06-15 ENCOUNTER — Ambulatory Visit: Payer: Managed Care, Other (non HMO) | Admitting: Urology

## 2018-06-15 VITALS — BP 141/83 | HR 100 | Ht 73.0 in | Wt 221.0 lb

## 2018-06-15 DIAGNOSIS — N529 Male erectile dysfunction, unspecified: Secondary | ICD-10-CM

## 2018-06-15 DIAGNOSIS — R350 Frequency of micturition: Secondary | ICD-10-CM

## 2018-06-15 DIAGNOSIS — R351 Nocturia: Secondary | ICD-10-CM | POA: Diagnosis not present

## 2018-06-15 LAB — BLADDER SCAN AMB NON-IMAGING

## 2018-06-16 ENCOUNTER — Other Ambulatory Visit: Payer: Self-pay | Admitting: Urology

## 2018-06-17 ENCOUNTER — Encounter: Payer: Self-pay | Admitting: Urology

## 2018-06-24 ENCOUNTER — Ambulatory Visit: Payer: 59 | Admitting: Psychology

## 2018-06-24 DIAGNOSIS — F411 Generalized anxiety disorder: Secondary | ICD-10-CM | POA: Diagnosis not present

## 2018-07-09 ENCOUNTER — Telehealth: Payer: Self-pay

## 2018-07-09 NOTE — Telephone Encounter (Signed)
Prior Authorization started with Cover my meds for Sildenafil

## 2018-07-22 ENCOUNTER — Ambulatory Visit: Payer: 59 | Admitting: Psychology

## 2018-08-06 ENCOUNTER — Ambulatory Visit: Payer: Self-pay | Admitting: Psychology

## 2018-08-31 ENCOUNTER — Encounter: Payer: Self-pay | Admitting: Internal Medicine

## 2018-09-03 ENCOUNTER — Ambulatory Visit: Payer: 59 | Admitting: Psychology

## 2018-09-03 DIAGNOSIS — F411 Generalized anxiety disorder: Secondary | ICD-10-CM

## 2018-09-21 ENCOUNTER — Other Ambulatory Visit: Payer: Self-pay | Admitting: Pain Medicine

## 2018-09-21 DIAGNOSIS — M5416 Radiculopathy, lumbar region: Secondary | ICD-10-CM

## 2018-09-21 DIAGNOSIS — M961 Postlaminectomy syndrome, not elsewhere classified: Secondary | ICD-10-CM

## 2018-09-24 ENCOUNTER — Ambulatory Visit: Payer: 59 | Admitting: Psychology

## 2018-09-28 ENCOUNTER — Ambulatory Visit: Admission: RE | Admit: 2018-09-28 | Payer: 59 | Source: Ambulatory Visit

## 2018-09-30 ENCOUNTER — Other Ambulatory Visit: Payer: Self-pay | Admitting: Urology

## 2018-10-28 ENCOUNTER — Ambulatory Visit: Admission: RE | Admit: 2018-10-28 | Payer: Managed Care, Other (non HMO) | Source: Ambulatory Visit

## 2018-10-28 ENCOUNTER — Ambulatory Visit: Payer: 59 | Admitting: Psychology

## 2018-10-28 ENCOUNTER — Ambulatory Visit (INDEPENDENT_AMBULATORY_CARE_PROVIDER_SITE_OTHER): Payer: 59 | Admitting: Psychology

## 2018-10-28 DIAGNOSIS — F411 Generalized anxiety disorder: Secondary | ICD-10-CM

## 2018-11-11 ENCOUNTER — Ambulatory Visit (INDEPENDENT_AMBULATORY_CARE_PROVIDER_SITE_OTHER): Payer: 59 | Admitting: Psychology

## 2018-11-11 DIAGNOSIS — F411 Generalized anxiety disorder: Secondary | ICD-10-CM | POA: Diagnosis not present

## 2018-11-17 ENCOUNTER — Ambulatory Visit
Admission: RE | Admit: 2018-11-17 | Discharge: 2018-11-17 | Disposition: A | Discharge status: Home / Self Care | Payer: Managed Care, Other (non HMO) | Source: Ambulatory Visit | Attending: Pain Medicine | Admitting: Pain Medicine

## 2018-11-17 ENCOUNTER — Other Ambulatory Visit: Payer: Self-pay

## 2018-11-17 DIAGNOSIS — M961 Postlaminectomy syndrome, not elsewhere classified: Secondary | ICD-10-CM | POA: Diagnosis not present

## 2018-11-17 DIAGNOSIS — M5416 Radiculopathy, lumbar region: Secondary | ICD-10-CM | POA: Diagnosis present

## 2018-11-17 MED ORDER — GADOBUTROL 1 MMOL/ML IV SOLN
10.0000 mL | Freq: Once | INTRAVENOUS | Status: AC | PRN
  Administered 2018-11-17: 10 mL via INTRAVENOUS

## 2018-11-24 ENCOUNTER — Ambulatory Visit (INDEPENDENT_AMBULATORY_CARE_PROVIDER_SITE_OTHER): Payer: 59 | Admitting: Psychology

## 2018-11-24 DIAGNOSIS — F411 Generalized anxiety disorder: Secondary | ICD-10-CM | POA: Diagnosis not present

## 2018-12-07 ENCOUNTER — Other Ambulatory Visit: Payer: Self-pay | Admitting: Nurse Practitioner

## 2018-12-07 DIAGNOSIS — R7989 Other specified abnormal findings of blood chemistry: Secondary | ICD-10-CM

## 2018-12-13 ENCOUNTER — Other Ambulatory Visit: Payer: Self-pay

## 2018-12-13 ENCOUNTER — Ambulatory Visit
Admission: RE | Admit: 2018-12-13 | Discharge: 2018-12-13 | Disposition: A | Discharge status: Home / Self Care | Payer: Managed Care, Other (non HMO) | Source: Ambulatory Visit | Attending: Nurse Practitioner | Admitting: Nurse Practitioner

## 2018-12-13 DIAGNOSIS — R945 Abnormal results of liver function studies: Secondary | ICD-10-CM | POA: Insufficient documentation

## 2018-12-13 DIAGNOSIS — R7989 Other specified abnormal findings of blood chemistry: Secondary | ICD-10-CM

## 2018-12-20 ENCOUNTER — Other Ambulatory Visit: Payer: Self-pay | Admitting: Urology

## 2018-12-29 ENCOUNTER — Ambulatory Visit (INDEPENDENT_AMBULATORY_CARE_PROVIDER_SITE_OTHER): Payer: 59 | Admitting: Psychology

## 2018-12-29 DIAGNOSIS — F411 Generalized anxiety disorder: Secondary | ICD-10-CM | POA: Diagnosis not present

## 2019-01-19 ENCOUNTER — Ambulatory Visit (INDEPENDENT_AMBULATORY_CARE_PROVIDER_SITE_OTHER): Payer: 59 | Admitting: Psychology

## 2019-01-19 DIAGNOSIS — F411 Generalized anxiety disorder: Secondary | ICD-10-CM

## 2019-01-31 ENCOUNTER — Other Ambulatory Visit: Payer: Self-pay | Admitting: Urology

## 2019-02-08 ENCOUNTER — Ambulatory Visit (INDEPENDENT_AMBULATORY_CARE_PROVIDER_SITE_OTHER): Payer: 59 | Admitting: Psychology

## 2019-02-08 DIAGNOSIS — F411 Generalized anxiety disorder: Secondary | ICD-10-CM

## 2019-03-02 ENCOUNTER — Ambulatory Visit: Payer: 59 | Admitting: Psychology

## 2019-03-03 ENCOUNTER — Other Ambulatory Visit: Payer: Self-pay | Admitting: Urology

## 2019-03-04 ENCOUNTER — Ambulatory Visit: Payer: Self-pay | Admitting: Psychology

## 2019-03-21 ENCOUNTER — Other Ambulatory Visit: Payer: Self-pay | Admitting: Urology

## 2019-04-05 ENCOUNTER — Other Ambulatory Visit: Payer: Self-pay | Admitting: Urology

## 2019-04-05 NOTE — Telephone Encounter (Signed)
RX sent to pharmacy  

## 2019-04-18 ENCOUNTER — Other Ambulatory Visit: Payer: Self-pay | Admitting: Urology

## 2019-05-16 ENCOUNTER — Ambulatory Visit (INDEPENDENT_AMBULATORY_CARE_PROVIDER_SITE_OTHER): Payer: 59 | Admitting: Psychology

## 2019-05-16 DIAGNOSIS — F411 Generalized anxiety disorder: Secondary | ICD-10-CM

## 2019-06-10 ENCOUNTER — Other Ambulatory Visit: Payer: Self-pay | Admitting: Urology

## 2019-06-14 ENCOUNTER — Ambulatory Visit (INDEPENDENT_AMBULATORY_CARE_PROVIDER_SITE_OTHER): Payer: 59 | Admitting: Psychology

## 2019-06-14 DIAGNOSIS — F411 Generalized anxiety disorder: Secondary | ICD-10-CM | POA: Diagnosis not present

## 2019-06-15 NOTE — Progress Notes (Signed)
06/16/2019  10:00 AM   Darren Mason [birth year] [id number]  Referring provider: Doreene Nest, NP [address] [place],  [place] [address]  Chief Complaint  Patient presents with  . Erectile Dysfunction    HPI: Darren Mason is a 38 y.o. male presenting today for his annual follow up. He has a history of  frequency, nocturia and ED.  BPH WITH LUTS  (prostate and/or bladder) IPSS score: 21/4    PVR: 0 mL   Previous score: 19/3  Previous PVR: 24 mL  Major complaint(s): Frequency, nocturia and post void dribbling X 2 to 3 years. Denies any dysuria, hematuria or suprapubic pain.   Denies any recent fevers, chills, nausea or vomiting.  Had been on Toviaz 4 mg daily with some relief of his symptoms, but he has been without that medication for several weeks.  IPSS    Row Name 06/16/19 1600         International Prostate Symptom Score   How often have you had the sensation of not emptying your bladder?  Less than half the time     How often have you had to urinate less than every two hours?  Almost always     How often have you found you stopped and started again several times when you urinated?  About half the time     How often have you found it difficult to postpone urination?  Almost always     How often have you had a weak urinary stream?  Less than half the time     How often have you had to strain to start urination?  Less than half the time     How many times did you typically get up at night to urinate?  2 Times     Total IPSS Score  21       Quality of Life due to urinary symptoms   If you were to spend the rest of your life with your urinary condition just the way it is now how would you feel about that?  Mostly Disatisfied        Score:  1-7 Mild 8-19 Moderate 20-35 Severe    Erectile dysfunction SHIM score: 24   Previous SHIM score: 24 Main complaint: Achieving an erection x 2 to 3years Risk factors:  age, testosterone deficiency, HTN, HLD, anxiety,  depression and pain medication  No painful erections or curvatures with his erections.    Still having spontaneous erections.  Tried: Good therapeutic results with sildenafil   SHIM    Row Name 06/16/19 1601         SHIM: Over the last 6 months:   How do you rate your confidence that you could get and keep an erection?  High     When you had erections with sexual stimulation, how often were your erections hard enough for penetration (entering your partner)?  Almost Always or Always     During sexual intercourse, how often were you able to maintain your erection after you had penetrated (entered) your partner?  Almost Always or Always     During sexual intercourse, how difficult was it to maintain your erection to completion of intercourse?  Not Difficult       SHIM Total Score   SHIM  19        Score: 1-7 Severe ED 8-11 Moderate ED 12-16 Mild-Moderate ED 17-21 Mild ED 22-25 No ED   PMH: Past Medical History:  Diagnosis  Date  . ADHD   . Anxiety and depression   . Chronic back pain   . Osteoarthritis   . Serotonin syndrome   . Testosterone deficiency     Surgical History: Past Surgical History:  Procedure Laterality Date  . APPENDECTOMY  2001    Home Medications:  Allergies as of 06/16/2019   No Known Allergies     Medication List       Accurate as of June 16, 2019 11:59 PM. If you have any questions, ask your nurse or doctor.        buPROPion 75 MG tablet Commonly known as: WELLBUTRIN Take 75 mg by mouth 2 (two) times daily.   carisoprodol 350 MG tablet Commonly known as: SOMA Take 350 mg by mouth 3 (three) times daily as needed.   diclofenac 75 MG EC tablet Commonly known as: VOLTAREN Take 75 mg by mouth 2 (two) times daily.   fesoterodine 4 MG Tb24 tablet Commonly known as: Toviaz Take 1 tablet (4 mg total) by mouth daily.   lisdexamfetamine 50 MG capsule Commonly known as: VYVANSE Take 50 mg by mouth daily.   oxyCODONE-acetaminophen  10-325 MG tablet Commonly known as: PERCOCET Take 1 tablet by mouth 4 (four) times daily as needed for pain.   phenazopyridine 200 MG tablet Commonly known as: Pyridium Take 1 tablet (200 mg total) by mouth 3 (three) times daily as needed for pain.   pregabalin 150 MG capsule Commonly known as: LYRICA Take 150 mg by mouth 2 (two) times daily.   propranolol 10 MG tablet Commonly known as: INDERAL Take 1 tablet (10 mg total) by mouth 3 (three) times daily.   sildenafil 20 MG tablet Commonly known as: REVATIO TAKE 3-5 TABLETS BY MOUTH 2 HRS BEFORE INTERCOURSE ON EMPTY STOMACH. DO NOT TAKE WITH NITRATES   testosterone cypionate 200 MG/ML injection Commonly known as: DEPOTESTOSTERONE CYPIONATE Inject 200 mg into the muscle See admin instructions. Inject 200 mg IM every 10 days       Allergies: No Known Allergies  Family History: Family History  Problem Relation Age of Onset  . Colon cancer Maternal Grandmother   . Prostate cancer Neg Hx   . Kidney cancer Neg Hx   . Bladder Cancer Neg Hx    Social History:  reports that he has never smoked. His smokeless tobacco use includes chew. He reports current alcohol use. He reports that he does not use drugs.  ROS: UROLOGY Frequent Urination?: Yes Hard to postpone urination?: No Burning/pain with urination?: No Get up at night to urinate?: Yes Leakage of urine?: Yes Urine stream starts and stops?: No Trouble starting stream?: No Do you have to strain to urinate?: No Blood in urine?: No Urinary tract infection?: No Sexually transmitted disease?: No Injury to kidneys or bladder?: No Painful intercourse?: No Weak stream?: No Erection problems?: Yes Penile pain?: No  Gastrointestinal Nausea?: No Vomiting?: No Indigestion/heartburn?: Yes Diarrhea?: No Constipation?: Yes  Constitutional Fever: No Night sweats?: Yes Weight loss?: No Fatigue?: Yes  Skin Skin rash/lesions?: No Itching?: No  Eyes Blurred vision?: No  Double vision?: No  Ears/Nose/Throat Sore throat?: No Sinus problems?: No  Hematologic/Lymphatic Swollen glands?: No Easy bruising?: No  Cardiovascular Leg swelling?: No Chest pain?: No  Respiratory Cough?: No Shortness of breath?: No  Endocrine Excessive thirst?: No  Musculoskeletal Back pain?: Yes Joint pain?: Yes  Neurological Headaches?: Yes Dizziness?: Yes  Psychologic Depression?: Yes Anxiety?: Yes  Physical Exam: BP (!) 164/122   Pulse Marland Kitchen(!)  137   Wt 229 lb 9.6 oz (104.1 kg)   BMI 30.29 kg/m   Constitutional:  Well nourished. Alert and oriented, diaphoretic and nervous  HEENT: [place] AT, moist mucus membranes.  Trachea midline, no masses. Cardiovascular: No clubbing, cyanosis, or edema. Respiratory: Normal respiratory effort, no increased work of breathing. GI: Abdomen is soft, non tender, non distended, no abdominal masses. Liver and spleen not palpable.  No hernias appreciated.  Stool sample for occult testing is not indicated.   GU: No CVA tenderness.  No bladder fullness or masses.  Patient with uncircumcised phallus.  Foreskin easily retracted  Urethral meatus is patent.  No penile discharge. No penile lesions or rashes. Scrotum without lesions, cysts, rashes and/or edema.  Testicles are located scrotally bilaterally. No masses are appreciated in the testicles. Left and right epididymis are normal. Rectal: Not indicated Skin: No rashes, bruises or suspicious lesions. Lymph: No inguinal adenopathy. Neurologic: Grossly intact, no focal deficits, moving all 4 extremities. Psychiatric: Normal mood and affect.  Pertinent Imaging: Results for RUBY, DILONE (MRN [id number]) as of 06/17/2019 09:27  Ref. Range 02/23/2017 15:23 03/19/2017 16:38 06/15/2018 16:02 06/16/2019 16:02  Scan Result Unknown 0 11 24 ml 0    Assessment & Plan:    1. Hypertensive urgency Spoke with PCP and he is advised to stop his Vyvanse and testosterone cypionate and seek treatment in the  ED and then call their office first thing in the morning for an appointment  2. LU TS We will restart the Toviaz 4 mg daily We will reschedule the patient after he has been evaluated for his hypertensive urgency to schedule a follow-up appointment to discuss his lower urinary tract symptoms further as he was quite anxious during his appointment due to the high blood pressure  3. ED Refills given for sildenafil Return to clinic in 1 year for SHIM and exam  4.  Testosterone deficiency Managed by PCP, but may be the cause of his lower urinary tract symptoms We will discuss further upon return   Return for to the ED .  These notes generated with voice recognition software. I apologize for typographical errors.  Laneta Simmers   [facility] [address] [place], [place] [address] [phone]

## 2019-06-16 ENCOUNTER — Emergency Department
Admission: EM | Admit: 2019-06-16 | Discharge: 2019-06-16 | Disposition: A | Discharge status: Home / Self Care | Payer: Managed Care, Other (non HMO) | Attending: Emergency Medicine | Admitting: Emergency Medicine

## 2019-06-16 ENCOUNTER — Ambulatory Visit (INDEPENDENT_AMBULATORY_CARE_PROVIDER_SITE_OTHER): Payer: Managed Care, Other (non HMO) | Admitting: Urology

## 2019-06-16 ENCOUNTER — Encounter: Payer: Self-pay | Admitting: Urology

## 2019-06-16 ENCOUNTER — Other Ambulatory Visit: Payer: Self-pay

## 2019-06-16 VITALS — BP 164/122 | HR 137 | Wt 229.6 lb

## 2019-06-16 DIAGNOSIS — R351 Nocturia: Secondary | ICD-10-CM | POA: Diagnosis not present

## 2019-06-16 DIAGNOSIS — F419 Anxiety disorder, unspecified: Secondary | ICD-10-CM

## 2019-06-16 DIAGNOSIS — R399 Unspecified symptoms and signs involving the genitourinary system: Secondary | ICD-10-CM

## 2019-06-16 DIAGNOSIS — F5221 Male erectile disorder: Secondary | ICD-10-CM

## 2019-06-16 DIAGNOSIS — E0590 Thyrotoxicosis, unspecified without thyrotoxic crisis or storm: Secondary | ICD-10-CM

## 2019-06-16 DIAGNOSIS — E039 Hypothyroidism, unspecified: Secondary | ICD-10-CM | POA: Diagnosis not present

## 2019-06-16 DIAGNOSIS — F17220 Nicotine dependence, chewing tobacco, uncomplicated: Secondary | ICD-10-CM | POA: Insufficient documentation

## 2019-06-16 DIAGNOSIS — I160 Hypertensive urgency: Secondary | ICD-10-CM | POA: Diagnosis not present

## 2019-06-16 DIAGNOSIS — I10 Essential (primary) hypertension: Secondary | ICD-10-CM

## 2019-06-16 DIAGNOSIS — R000 Tachycardia, unspecified: Secondary | ICD-10-CM | POA: Diagnosis not present

## 2019-06-16 LAB — BASIC METABOLIC PANEL
Anion gap: 11 (ref 5–15)
BUN: 14 mg/dL (ref 6–20)
CO2: 24 mmol/L (ref 22–32)
Calcium: 9.4 mg/dL (ref 8.9–10.3)
Chloride: 102 mmol/L (ref 98–111)
Creatinine, Ser: 1.35 mg/dL — ABNORMAL HIGH (ref 0.61–1.24)
GFR calc Af Amer: >60 mL/min (ref >60)
GFR calc non Af Amer: >60 mL/min (ref >60)
Glucose, Bld: 101 mg/dL — ABNORMAL HIGH (ref 70–99)
Potassium: 3.7 mmol/L (ref 3.5–5.1)
Sodium: 137 mmol/L (ref 135–145)

## 2019-06-16 LAB — CBC
HCT: 45.7 % (ref 39.0–52.0)
Hemoglobin: 15.8 g/dL (ref 13.0–17.0)
MCH: 29.7 pg (ref 26.0–34.0)
MCHC: 34.6 g/dL (ref 30.0–36.0)
MCV: 85.9 fL (ref 80.0–100.0)
Platelets: 209 10*3/uL (ref 150–400)
RBC: 5.32 MIL/uL (ref 4.22–5.81)
RDW: 12.1 % (ref 11.5–15.5)
WBC: 7.2 10*3/uL (ref 4.0–10.5)
nRBC: 0 % (ref 0.0–0.2)

## 2019-06-16 LAB — TROPONIN I (HIGH SENSITIVITY)
Troponin I (High Sensitivity): 20 ng/L — ABNORMAL HIGH (ref <18)
Troponin I (High Sensitivity): 24 ng/L — ABNORMAL HIGH (ref <18)

## 2019-06-16 LAB — BLADDER SCAN AMB NON-IMAGING: Scan Result: 0

## 2019-06-16 LAB — TSH: TSH: 0.668 u[IU]/mL (ref 0.350–4.500)

## 2019-06-16 LAB — T4, FREE: Free T4: 1.44 ng/dL — ABNORMAL HIGH (ref 0.61–1.12)

## 2019-06-16 MED ORDER — FESOTERODINE FUMARATE ER 4 MG PO TB24: 4.0000 mg | ORAL_TABLET | Freq: Every day | ORAL | 3 refills | Status: DC

## 2019-06-16 MED ORDER — PROPRANOLOL HCL 20 MG PO TABS
10.0000 mg | ORAL_TABLET | Freq: Once | ORAL | Status: AC
  Administered 2019-06-16: 10 mg via ORAL
  Filled 2019-06-16: qty 1

## 2019-06-16 MED ORDER — LORAZEPAM 1 MG PO TABS
1.0000 mg | ORAL_TABLET | Freq: Once | ORAL | Status: AC
  Administered 2019-06-16: 1 mg via ORAL
  Filled 2019-06-16: qty 1

## 2019-06-16 MED ORDER — PROPRANOLOL HCL 10 MG PO TABS: 10.0000 mg | ORAL_TABLET | Freq: Three times a day (TID) | ORAL | 2 refills | Status: DC

## 2019-06-16 MED ORDER — DIAZEPAM 5 MG PO TABS
10.0000 mg | ORAL_TABLET | Freq: Once | ORAL | Status: AC
  Administered 2019-06-16: 10 mg via ORAL
  Filled 2019-06-16: qty 2

## 2019-06-16 NOTE — ED Notes (Signed)
EKG to EDP Williams.  

## 2019-06-16 NOTE — ED Notes (Signed)
Called lab. States T3 is a send-out and that they cannot add it on. Will send red tube soon.

## 2019-06-16 NOTE — ED Provider Notes (Signed)
[facility] Emergency Department Provider Note       Time seen: ----------------------------------------- 6:51 PM on 06/16/2019 -----------------------------------------   I have reviewed the triage vital signs and the nursing notes.  HISTORY   Chief Complaint Hypertension and Tachycardia    HPI Darren Mason is a 38 y.o. male with a history of ADHD, anxiety, depression, chronic back pain, serotonin syndrome who presents to the ED for hypertension.  Patient went to his routine yearly appointment with urology today and was told his blood pressure was elevated.  Patient states he is anxious, has chronic back pain has been in a lot of pain recently.  He denies chest pain or difficulty breathing.  Past Medical History:  Diagnosis Date  . ADHD   . Anxiety and depression   . Chronic back pain   . Osteoarthritis   . Serotonin syndrome   . Testosterone deficiency     Patient Active Problem List   Diagnosis Date Noted  . Altered mental state 01/19/2017  . Ingrown toenail 05/24/2016  . Testosterone deficiency 05/24/2016  . Anxiety and depression 05/24/2016  . Osteoarthritis 05/24/2016  . Chronic back pain 05/24/2016    Past Surgical History:  Procedure Laterality Date  . APPENDECTOMY  2001    Allergies Patient has no known allergies.  Social History Social History   Tobacco Use  . Smoking status: Never Smoker  . Smokeless tobacco: Current User    Types: Chew  Substance Use Topics  . Alcohol use: Yes  . Drug use: No    Review of Systems Constitutional: Negative for fever. Cardiovascular: Negative for chest pain. Respiratory: Negative for shortness of breath. Gastrointestinal: Negative for abdominal pain, vomiting and diarrhea. Musculoskeletal: Positive for back pain Skin: Negative for rash. Neurological: Negative for headaches, focal weakness or numbness. Psychiatric: Positive for anxiety  All systems negative/normal/unremarkable  except as stated in the HPI  ____________________________________________   PHYSICAL EXAM:  VITAL SIGNS: ED Triage Vitals  Enc Vitals Group     BP 06/16/19 1709 (!) 159/110     Pulse Rate 06/16/19 1709 (!) 128     Resp 06/16/19 1709 (!) 22     Temp 06/16/19 1709 98 F (36.7 C)     Temp Source 06/16/19 1709 Oral     SpO2 06/16/19 1709 98 %     Weight 06/16/19 1708 228 lb (103.4 kg)     Height 06/16/19 1708 6\' 1"  (1.854 m)     Head Circumference --      Peak Flow --      Pain Score 06/16/19 1707 8     Pain Loc --      Pain Edu? --      Excl. in [place]? --    Constitutional: Alert and oriented.  Anxious Eyes: Conjunctivae are normal. Normal extraocular movements. ENT      Head: Normocephalic and atraumatic.      Nose: No congestion/rhinnorhea.      Mouth/Throat: Mucous membranes are moist.      Neck: No stridor. Cardiovascular: Rapid rate, regular rhythm. No murmurs, rubs, or gallops. Respiratory: Normal respiratory effort without tachypnea nor retractions. Breath sounds are clear and equal bilaterally. No wheezes/rales/rhonchi. Gastrointestinal: Soft and nontender. Normal bowel sounds Musculoskeletal: Nontender with normal range of motion in extremities. No lower extremity tenderness nor edema. Neurologic:  Normal speech and language. No gross focal neurologic deficits are appreciated.  Skin:  Skin is warm, dry and intact. No rash noted. Psychiatric: Anxious mood and affect  ____________________________________________  EKG: Interpreted by me.  Sinus tachycardia with a rate of 117 bpm, normal axis, normal QT  ____________________________________________  ED COURSE:  As part of my medical decision making, I reviewed the following data within the electronic MEDICAL RECORD NUMBER History obtained from family if available, nursing notes, old chart and ekg, as well as notes from prior ED visits. Patient presented for anxiety, hypertension and tachycardia, we will assess with labs and  imaging as indicated at this time.   Procedures  Darren Mason was evaluated in Emergency Department on 06/16/2019 for the symptoms described in the history of present illness. He was evaluated in the context of the global COVID-19 pandemic, which necessitated consideration that the patient might be at risk for infection with the SARS-CoV-2 virus that causes COVID-19. Institutional protocols and algorithms that pertain to the evaluation of patients at risk for COVID-19 are in a state of rapid change based on information released by regulatory bodies including the CDC and federal and state organizations. These policies and algorithms were followed during the patient's care in the ED.  ____________________________________________   LABS (pertinent positives/negatives)  Labs Reviewed  BASIC METABOLIC PANEL - Abnormal; Notable for the following components:      Result Value   Glucose, Bld 101 (*)    Creatinine, Ser 1.35 (*)    All other components within normal limits  T4, FREE - Abnormal; Notable for the following components:   Free T4 1.44 (*)    All other components within normal limits  TROPONIN I (HIGH SENSITIVITY) - Abnormal; Notable for the following components:   Troponin I (High Sensitivity) 20 (*)    All other components within normal limits  TROPONIN I (HIGH SENSITIVITY) - Abnormal; Notable for the following components:   Troponin I (High Sensitivity) 24 (*)    All other components within normal limits  CBC  TSH  T3  ____________________________________________   DIFFERENTIAL DIAGNOSIS   Anxiety, depression, chronic pain, essential hypertension, hypertensive urgency, hypothyroidism  FINAL ASSESSMENT AND PLAN  Anxiety, borderline hyperthyroidism   Plan: The patient had presented mostly for anxiety related complaints. Patient's labs revealed some irregularities with borderline troponin and elevated T4 but normal TSH.  I will place him on propranolol to keep his heart rate  and blood pressure under control.  He will be referred to endocrinology for a close outpatient follow-up.   Ulice Dash, MD    Note: This note was generated in part or whole with voice recognition software. Voice recognition is usually quite accurate but there are transcription errors that can and very often do occur. I apologize for any typographical errors that were not detected and corrected.     Emily Filbert, MD 06/16/19 2108

## 2019-06-16 NOTE — ED Notes (Addendum)
States bp usually high when he goes to dr - he was at urologist today and it was high. States they gave him xanax. States under stress from friends dying. Also has pain mgmt dr and states in pain. Pt notable anxious and talkative

## 2019-06-16 NOTE — ED Triage Notes (Signed)
Pt reports he went for routine yearly appt with urology today and was told his BP was high. Pt states he is anxious, has chronic back pain and has been in a lot of pain recently. Denies CP or SOB.  Pt appears anxious, rapid talking. Does not take any BP medications.

## 2019-06-16 NOTE — ED Notes (Addendum)
Pt up to bathroom. Will capture EKG, give meds and place IV once pt back to room. Pt anxious.

## 2019-06-17 ENCOUNTER — Telehealth: Payer: Self-pay | Admitting: Urology

## 2019-06-17 NOTE — Telephone Encounter (Signed)
Will you call Mr. Darren Mason and have him schedule a follow up appointment in one month so we can see how the [name] is working for him?

## 2019-06-17 NOTE — Telephone Encounter (Signed)
Appt made and pt confirmed. °

## 2019-06-18 LAB — T3: T3, Total: 116 ng/dL (ref 71–180)

## 2019-06-28 ENCOUNTER — Ambulatory Visit: Payer: 59 | Admitting: Psychology

## 2019-07-12 ENCOUNTER — Ambulatory Visit: Payer: 59 | Admitting: Psychology

## 2019-07-18 NOTE — Progress Notes (Signed)
07/19/2019  5:38 PM   [name] 07-11-1980 [id number]  Referring provider: Doreene Nest, NP [address] [place],  [place] [address]  Chief Complaint  Patient presents with  . Urinary Frequency    HPI: Darren Mason is a 39 y.o. male with LU TS, ED and testosterone deficiency who presents today for follow up after a trial of Toviaz.    BPH WITH LUTS  (prostate and/or bladder) IPSS score: 25/3    PVR: 0 mL   Previous score: 21/4 Previous PVR: 0 mL  Major complaint(s): Frequency, urgency, nocturia, incontinence, intermittency and straining to urinate.  Denies any dysuria, hematuria or suprapubic pain.  Denies any recent fevers, chills, nausea or vomiting.  He feels that the Toviaz 4 mg daily has been somewhat helpful in reaching goal when dealing with his urinary issues.    IPSS    Row Name 07/19/19 1500         International Prostate Symptom Score   How often have you had the sensation of not emptying your bladder?  Not at All     How often have you had to urinate less than every two hours?  Almost always     How often have you found you stopped and started again several times when you urinated?  About half the time     How often have you found it difficult to postpone urination?  Almost always     How often have you had a weak urinary stream?  About half the time     How often have you had to strain to start urination?  More than half the time     How many times did you typically get up at night to urinate?  5 Times     Total IPSS Score  25       Quality of Life due to urinary symptoms   If you were to spend the rest of your life with your urinary condition just the way it is now how would you feel about that?  Mixed        Score:  1-7 Mild 8-19 Moderate 20-35 Severe  PMH: Past Medical History:  Diagnosis Date  . ADHD   . Anxiety and depression   . Chronic back pain   . Osteoarthritis   . Serotonin syndrome   . Sleep apnea    CPAP machine   .  Testosterone deficiency     Surgical History: Past Surgical History:  Procedure Laterality Date  . APPENDECTOMY  2001    Home Medications:  Allergies as of 07/19/2019   No Known Allergies     Medication List       Accurate as of July 19, 2019 11:59 PM. If you have any questions, ask your nurse or doctor.        buPROPion 300 MG 24 hr tablet Commonly known as: WELLBUTRIN XL Take 300 mg by mouth daily. What changed: Another medication with the same name was removed. Continue taking this medication, and follow the directions you see here. Changed by: Michiel Cowboy, PA-C   carisoprodol 350 MG tablet Commonly known as: SOMA Take 350 mg by mouth 3 (three) times daily as needed.   diclofenac 75 MG EC tablet Commonly known as: VOLTAREN Take 75 mg by mouth 2 (two) times daily.   escitalopram 10 MG tablet Commonly known as: LEXAPRO Take 10 mg by mouth daily.   fesoterodine 4 MG Tb24 tablet Commonly known as: Midwife  Take 1 tablet (4 mg total) by mouth daily.   lisdexamfetamine 50 MG capsule Commonly known as: VYVANSE Take 50 mg by mouth daily.   losartan 100 MG tablet Commonly known as: COZAAR Take 100 mg by mouth daily.   oxybutynin 10 MG 24 hr tablet Commonly known as: DITROPAN-XL Take 1 tablet (10 mg total) by mouth daily. Started by: Zara Council, PA-C   oxyCODONE-acetaminophen 10-325 MG tablet Commonly known as: PERCOCET Take 1 tablet by mouth 4 (four) times daily as needed for pain.   phenazopyridine 200 MG tablet Commonly known as: Pyridium Take 1 tablet (200 mg total) by mouth 3 (three) times daily as needed for pain.   pregabalin 150 MG capsule Commonly known as: LYRICA Take 150 mg by mouth 2 (two) times daily.   propranolol 10 MG tablet Commonly known as: INDERAL Take 1 tablet (10 mg total) by mouth 3 (three) times daily.   sildenafil 100 MG tablet Commonly known as: VIAGRA Take 1 tablet (100 mg total) by mouth daily as needed for  erectile dysfunction. Take two hours prior to intercourse on an empty stomach Started by: Zara Council, PA-C   sildenafil 20 MG tablet Commonly known as: REVATIO TAKE 3-5 TABLETS BY MOUTH 2 HRS BEFORE INTERCOURSE ON EMPTY STOMACH. DO NOT TAKE WITH NITRATES   testosterone cypionate 200 MG/ML injection Commonly known as: DEPOTESTOSTERONE CYPIONATE Inject 200 mg into the muscle See admin instructions. Inject 200 mg IM every 10 days       Allergies: No Known Allergies  Family History: Family History  Problem Relation Age of Onset  . Hypertension Mother   . Hyperlipidemia Mother   . Colon cancer Maternal Grandmother   . Hypertension Father   . Hyperlipidemia Father   . Prostate cancer Neg Hx   . Kidney cancer Neg Hx   . Bladder Cancer Neg Hx    Social History:  reports that he has never smoked. His smokeless tobacco use includes chew. He reports current alcohol use. He reports that he does not use drugs.  ROS: UROLOGY Frequent Urination?: Yes Hard to postpone urination?: Yes Burning/pain with urination?: No Get up at night to urinate?: Yes Leakage of urine?: Yes Urine stream starts and stops?: Yes Trouble starting stream?: No Do you have to strain to urinate?: Yes Blood in urine?: No Urinary tract infection?: No Sexually transmitted disease?: No Injury to kidneys or bladder?: No Painful intercourse?: No Weak stream?: No Erection problems?: Yes Penile pain?: No  Gastrointestinal Nausea?: No Vomiting?: No Indigestion/heartburn?: Yes Diarrhea?: No Constipation?: No  Constitutional Fever: No Night sweats?: No Weight loss?: No Fatigue?: No  Skin Skin rash/lesions?: No Itching?: No  Eyes Blurred vision?: No Double vision?: No  Ears/Nose/Throat Sore throat?: No Sinus problems?: No  Hematologic/Lymphatic Swollen glands?: No Easy bruising?: No  Cardiovascular Leg swelling?: No Chest pain?: No  Respiratory Cough?: No Shortness of breath?:  No  Endocrine Excessive thirst?: No  Musculoskeletal Back pain?: Yes Joint pain?: Yes  Neurological Headaches?: No Dizziness?: No  Psychologic Depression?: Yes Anxiety?: Yes  Physical Exam: BP (!) 149/90   Pulse (!) 132   Ht 6\' 1"  (1.854 m)   Wt 220 lb (99.8 kg)   BMI 29.03 kg/m   Constitutional:  Well nourished. Alert and oriented, No acute distress.  Perfuse sweating.   HEENT: [place] AT, mask in place  Trachea midline, no masses. Cardiovascular: No clubbing, cyanosis, or edema. Respiratory: Normal respiratory effort, no increased work of breathing. Neurologic: Grossly intact, no focal  deficits, moving all 4 extremities. Psychiatric: Normal mood and affect.   Pertinent Imaging: Results for STEELE, STRACENER (MRN [id number]) as of 07/22/2019 17:41  Ref. Range 07/19/2019 15:41  Scan Result Unknown 0   Assessment & Plan:    1. LU TS We will change to oxybutynin XL 10 mg daily as his insurance will not cover the Toviaz 4 mg daily RTC in 8 weeks for I PSS and PVR    Return in about 8 weeks (around 09/13/2019) for IPSS, SHIM and PVR.  These notes generated with voice recognition software. I apologize for typographical errors.  Hulan Fray   [facility] [address] [place], [place] [address] [phone]

## 2019-07-19 ENCOUNTER — Encounter: Payer: Self-pay | Admitting: Urology

## 2019-07-19 ENCOUNTER — Other Ambulatory Visit: Payer: Self-pay

## 2019-07-19 ENCOUNTER — Ambulatory Visit: Payer: Managed Care, Other (non HMO) | Admitting: Urology

## 2019-07-19 VITALS — BP 149/90 | HR 132 | Ht 73.0 in | Wt 220.0 lb

## 2019-07-19 DIAGNOSIS — R399 Unspecified symptoms and signs involving the genitourinary system: Secondary | ICD-10-CM | POA: Diagnosis not present

## 2019-07-19 LAB — BLADDER SCAN AMB NON-IMAGING: Scan Result: 0

## 2019-07-19 MED ORDER — OXYBUTYNIN CHLORIDE ER 10 MG PO TB24: 10.0000 mg | ORAL_TABLET | Freq: Every day | ORAL | 0 refills | Status: DC

## 2019-07-19 MED ORDER — SILDENAFIL CITRATE 100 MG PO TABS: 100.0000 mg | ORAL_TABLET | Freq: Every day | ORAL | 3 refills | Status: DC | PRN

## 2019-07-22 ENCOUNTER — Ambulatory Visit (INDEPENDENT_AMBULATORY_CARE_PROVIDER_SITE_OTHER): Payer: Managed Care, Other (non HMO) | Admitting: Cardiology

## 2019-07-22 ENCOUNTER — Other Ambulatory Visit: Payer: Self-pay

## 2019-07-22 ENCOUNTER — Encounter: Payer: Self-pay | Admitting: Cardiology

## 2019-07-22 VITALS — BP 140/80 | HR 106 | Ht 73.0 in | Wt 231.2 lb

## 2019-07-22 DIAGNOSIS — I10 Essential (primary) hypertension: Secondary | ICD-10-CM | POA: Diagnosis not present

## 2019-07-22 DIAGNOSIS — I479 Paroxysmal tachycardia, unspecified: Secondary | ICD-10-CM | POA: Diagnosis not present

## 2019-07-22 MED ORDER — CARVEDILOL 6.25 MG PO TABS: 6.2500 mg | ORAL_TABLET | Freq: Two times a day (BID) | ORAL | 3 refills | Status: DC

## 2019-07-22 NOTE — Progress Notes (Signed)
Cardiology Office Note:    Date:  07/22/2019   ID:  Darren Mason, DOB July 21, 1980, MRN [id number]  PCP:  Doreene Nest, NP  Cardiologist:  Debbe Odea, MD  Electrophysiologist:  None   Referring MD: Doreene Nest, NP   Chief Complaint  Patient presents with  . New Patient (Initial Visit)    Self ref for HTN and tachycardia. Meds reviewed by the pt. verbally. Pt. c/o chronic back pain, shortness of breath, elevated BP with rapid heart beats.    Darren Mason is a 39 y.o. male who is being seen today for the evaluation of elevated blood pressure at the request of Doreene Nest, NP.  History of Present Illness:    Darren Mason is a 39 y.o. male with a hx of ADHD, anxiety, testosterone deficiency who presents due to elevated blood pressures. Patient takes Vyvanse for ADHD and testosterone cypionate for low testosterone.  Has been taking both medications for roughly 10 years now.  He states both medications has changed his quality of life.  He was seen in the emergency room a month ago on June 16, 2019 for hypertension and tachycardia. Blood pressure in the emergency room was 159/110, heart rate 128. EKG showed sinus tachycardia.  After the ED visit, primary care provider recommended patient stop Vyvanse and testosterone.  He has not taken both medications for a month now.  Losartan 100 mg daily was started 2 weeks ago.    He subsequently saw urology for regular visit due to erectile dysfunction. Blood pressure at that visit was 149/90, pulse 132.  Patient denies any chest pain or shortness of breath both at rest or with exertion.  Denies any history of heart disease.  Cannot with history of right hip/back pain with radiculopathy.  Has appointments with pain management regarding this.  Currently rates pain as a 7/10.  Past Medical History:  Diagnosis Date  . ADHD   . Anxiety and depression   . Chronic back pain   . Osteoarthritis   . Serotonin syndrome   . Sleep  apnea    CPAP machine   . Testosterone deficiency     Past Surgical History:  Procedure Laterality Date  . APPENDECTOMY  2001    Current Medications: Current Meds  Medication Sig  . buPROPion (WELLBUTRIN XL) 300 MG 24 hr tablet Take 300 mg by mouth daily.   . carisoprodol (SOMA) 350 MG tablet Take 350 mg by mouth 3 (three) times daily as needed.   . diclofenac (VOLTAREN) 75 MG EC tablet Take 75 mg by mouth 2 (two) times daily.   Marland Kitchen escitalopram (LEXAPRO) 10 MG tablet Take 10 mg by mouth daily.  . fesoterodine (TOVIAZ) 4 MG TB24 tablet Take 1 tablet (4 mg total) by mouth daily.  Marland Kitchen lisdexamfetamine (VYVANSE) 50 MG capsule Take 50 mg by mouth daily.  Marland Kitchen losartan (COZAAR) 100 MG tablet Take 100 mg by mouth daily.  Marland Kitchen oxybutynin (DITROPAN-XL) 10 MG 24 hr tablet Take 1 tablet (10 mg total) by mouth daily.  Marland Kitchen oxyCODONE-acetaminophen (PERCOCET) 10-325 MG tablet Take 1 tablet by mouth 4 (four) times daily as needed for pain.  . pregabalin (LYRICA) 150 MG capsule Take 150 mg by mouth 2 (two) times daily.  . sildenafil (VIAGRA) 100 MG tablet Take 1 tablet (100 mg total) by mouth daily as needed for erectile dysfunction. Take two hours prior to intercourse on an empty stomach     Allergies:   Patient has no known allergies.  Social History   Socioeconomic History  . Marital status: Married    Spouse name: Not on file  . Number of children: Not on file  . Years of education: Not on file  . Highest education level: Not on file  Occupational History  . Not on file  Tobacco Use  . Smoking status: Never Smoker  . Smokeless tobacco: Current User    Types: Chew  Substance and Sexual Activity  . Alcohol use: Yes  . Drug use: No  . Sexual activity: Not on file  Other Topics Concern  . Not on file  Social History Narrative  . Not on file   Social Determinants of Health   Financial Resource Strain:   . Difficulty of Paying Living Expenses: Not on file  Food Insecurity:   . Worried  About Programme researcher, broadcasting/film/video in the Last Year: Not on file  . Ran Out of Food in the Last Year: Not on file  Transportation Needs:   . Lack of Transportation (Medical): Not on file  . Lack of Transportation (Non-Medical): Not on file  Physical Activity:   . Days of Exercise per Week: Not on file  . Minutes of Exercise per Session: Not on file  Stress:   . Feeling of Stress : Not on file  Social Connections:   . Frequency of Communication with Friends and Family: Not on file  . Frequency of Social Gatherings with Friends and Family: Not on file  . Attends Religious Services: Not on file  . Active Member of Clubs or Organizations: Not on file  . Attends Banker Meetings: Not on file  . Marital Status: Not on file     Family History: The patient's family history includes Colon cancer in his maternal grandmother; Hyperlipidemia in his father and mother; Hypertension in his father and mother. There is no history of Prostate cancer, Kidney cancer, or Bladder Cancer.  ROS:   Please see the history of present illness.     All other systems reviewed and are negative.  EKGs/Labs/Other Studies Reviewed:    The following studies were reviewed today:   EKG:  EKG is  ordered today.  The ekg ordered today demonstrates sinus tachycardia, heart rate 106, otherwise normal ECG.  Recent Labs: 06/16/2019: BUN 14; Creatinine, Ser 1.35; Hemoglobin 15.8; Platelets 209; Potassium 3.7; Sodium 137; TSH 0.668  Recent Lipid Panel    Component Value Date/Time   TRIG 266 (H) 01/19/2017 1912    Physical Exam:    VS:  BP 140/80 (BP Location: Right Arm, Patient Position: Sitting, Cuff Size: Large)   Pulse (!) 106   Ht 6\' 1"  (1.854 m)   Wt 231 lb 4 oz (104.9 kg)   SpO2 98%   BMI 30.51 kg/m     Wt Readings from Last 3 Encounters:  07/22/19 231 lb 4 oz (104.9 kg)  07/19/19 220 lb (99.8 kg)  06/16/19 228 lb (103.4 kg)     GEN:  Well nourished, well developed, mild distress HEENT:  Normal NECK: No JVD; No carotid bruits LYMPHATICS: No lymphadenopathy CARDIAC: RRR, no murmurs, rubs, gallops RESPIRATORY:  Clear to auscultation without rales, wheezing or rhonchi  ABDOMEN: Soft, non-tender, non-distended MUSCULOSKELETAL:  No edema; No deformity  SKIN: Warm and dry NEUROLOGIC:  Alert and oriented x 3 PSYCHIATRIC:  Normal affect   ASSESSMENT:    1. Essential hypertension   2. Paroxysmal tachycardia (HCC)    PLAN:    In order of problems listed  above:  1. Blood pressures have improved but still elevated at 140/90.  Heart rate 106.  Causes for hypertension include being distress/pain, anxiety.  Testosterone and Vyvanse where definitely contributing to elevated blood pressures.  Continue losartan 100 mg daily.  Start carvedilol 6.25 mg twice daily.  Will titrate as necessary.  From discussion with patient it seems he will resume taking Vyvanse and testosterone at some point which will undoubtedly increase his BP and heart rates.  Additional BP meds for blood pressure control will be needed at that point.  Patient has no indication for an echocardiogram at this point.  He has no symptoms and EKG shows sinus tachycardia otherwise normal.  Causes for his elevated blood pressure as stated above. 2. Tachycardia likely due to distress/pain, anxiety, medication side effects.  We will begin Coreg 6.25 twice daily as above.   Follow-up in 1 month  Long discussion with patient regarding no need for echocardiogram, indications for echocardiogram, side effects of medications causing elevated blood pressure and tachycardia. Total encounter time more than 45 minutes  Greater than 50% was spent in counseling and coordination of care with the patient   This note was generated in part or whole with voice recognition software. Voice recognition is usually quite accurate but there are transcription errors that can and very often do occur. I apologize for any typographical errors that were  not detected and corrected.  Medication Adjustments/Labs and Tests Ordered: Current medicines are reviewed at length with the patient today.  Concerns regarding medicines are outlined above.  Orders Placed This Encounter  Procedures  . EKG 12-Lead   Meds ordered this encounter  Medications  . carvedilol (COREG) 6.25 MG tablet    Sig: Take 1 tablet (6.25 mg total) by mouth 2 (two) times daily.    Dispense:  60 tablet    Refill:  3    Patient Instructions  Medication Instructions:  - Your physician has recommended you make the following change in your medication:   1) Start coreg (carvedilol) 6.25 mg- take 1 tablet by mouth twice a day  *If you need a refill on your cardiac medications before your next appointment, please call your pharmacy*  Lab Work: - none ordered  If you have labs (blood work) drawn today and your tests are completely normal, you will receive your results only by: Marland Kitchen MyChart Message (if you have MyChart) OR . A paper copy in the mail If you have any lab test that is abnormal or we need to change your treatment, we will call you to review the results.  Testing/Procedures: - none ordered  Follow-Up: At [facility], you and your health needs are our priority.  As part of our continuing mission to provide you with exceptional heart care, we have created designated Provider Care Teams.  These Care Teams include your primary Cardiologist (physician) and Advanced Practice Providers (APPs -  Physician Assistants and Nurse Practitioners) who all work together to provide you with the care you need, when you need it.  Your next appointment:   1 month(s)  The format for your next appointment:   In Person  Provider:   Kate Sable, MD  Other Instructions n/a     Signed, Kate Sable, MD  07/22/2019 10:36 AM    [place]

## 2019-07-22 NOTE — Patient Instructions (Signed)
Medication Instructions:  - Your physician has recommended you make the following change in your medication:   1) Start coreg (carvedilol) 6.25 mg- take 1 tablet by mouth twice a day  *If you need a refill on your cardiac medications before your next appointment, please call your pharmacy*  Lab Work: - none ordered  If you have labs (blood work) drawn today and your tests are completely normal, you will receive your results only by: Marland Kitchen MyChart Message (if you have MyChart) OR . A paper copy in the mail If you have any lab test that is abnormal or we need to change your treatment, we will call you to review the results.  Testing/Procedures: - none ordered  Follow-Up: At [facility], you and your health needs are our priority.  As part of our continuing mission to provide you with exceptional heart care, we have created designated Provider Care Teams.  These Care Teams include your primary Cardiologist (physician) and Advanced Practice Providers (APPs -  Physician Assistants and Nurse Practitioners) who all work together to provide you with the care you need, when you need it.  Your next appointment:   1 month(s)  The format for your next appointment:   In Person  Provider:   Debbe Odea, MD  Other Instructions n/a

## 2019-07-27 ENCOUNTER — Telehealth: Payer: Self-pay | Admitting: Cardiology

## 2019-07-27 NOTE — Telephone Encounter (Signed)
Spoke with patient to clarify what he needed. His PCP would like to make sure with Dr. Azucena Cecil that it is ok for patient to continue taking Testosterone and Vyvanse.  Advised patient I would route to Dr Azucena Cecil for advice.

## 2019-07-27 NOTE — Telephone Encounter (Signed)
Patient calling  States there are a couple medications we need to send approval of to his PCP Truddie Coco, NP Phone [phone] Fax [phone]

## 2019-07-28 NOTE — Telephone Encounter (Signed)
Darren Odea, MD  You 22 hours ago (2:31 PM)   Restarting Vyvanse and testosterone will undoubtedly increase patient's blood pressure and heart rate as stated in my note. If patient needs to go back on these medications, blood pressure medications will have to be uptitrated and or added. We will plan to increase Coreg as tolerated for blood pressure and heart rate control. If additional medications are needed we will consider at that time.   Routing comment

## 2019-07-28 NOTE — Telephone Encounter (Signed)
DPR on file with the ok to leave a detailed message on his voicemail. Left a message on the patients voicemail with Dr. Merita Norton response and recommendation. Documentation sent to the patients pcp Truddie Coco, NP fax# [phone] via Epic fax.

## 2019-08-02 ENCOUNTER — Ambulatory Visit: Payer: 59 | Admitting: Psychology

## 2019-08-05 ENCOUNTER — Ambulatory Visit (INDEPENDENT_AMBULATORY_CARE_PROVIDER_SITE_OTHER): Payer: 59 | Admitting: Psychology

## 2019-08-05 DIAGNOSIS — F411 Generalized anxiety disorder: Secondary | ICD-10-CM

## 2019-08-22 ENCOUNTER — Other Ambulatory Visit: Payer: Self-pay

## 2019-08-22 ENCOUNTER — Ambulatory Visit (INDEPENDENT_AMBULATORY_CARE_PROVIDER_SITE_OTHER): Payer: Managed Care, Other (non HMO) | Admitting: Cardiology

## 2019-08-22 ENCOUNTER — Encounter: Payer: Self-pay | Admitting: Cardiology

## 2019-08-22 VITALS — BP 140/92 | HR 86 | Ht 73.0 in | Wt 227.8 lb

## 2019-08-22 DIAGNOSIS — I479 Paroxysmal tachycardia, unspecified: Secondary | ICD-10-CM | POA: Diagnosis not present

## 2019-08-22 DIAGNOSIS — I10 Essential (primary) hypertension: Secondary | ICD-10-CM | POA: Diagnosis not present

## 2019-08-22 MED ORDER — CARVEDILOL 12.5 MG PO TABS: 12.5000 mg | ORAL_TABLET | Freq: Two times a day (BID) | ORAL | 2 refills | Status: DC

## 2019-08-22 NOTE — Patient Instructions (Signed)
Medication Instructions:  Your physician has recommended you make the following change in your medication:   INCREASE Carvedilol to 12.5 mg twice daily. An Rx has been sent to your pharmacy.  *If you need a refill on your cardiac medications before your next appointment, please call your pharmacy*  Lab Work: None ordered  If you have labs (blood work) drawn today and your tests are completely normal, you will receive your results only by: Marland Kitchen MyChart Message (if you have MyChart) OR . A paper copy in the mail If you have any lab test that is abnormal or we need to change your treatment, we will call you to review the results.  Testing/Procedures: None ordered  Follow-Up: At [facility], you and your health needs are our priority.  As part of our continuing mission to provide you with exceptional heart care, we have created designated Provider Care Teams.  These Care Teams include your primary Cardiologist (physician) and Advanced Practice Providers (APPs -  Physician Assistants and Nurse Practitioners) who all work together to provide you with the care you need, when you need it.  Your next appointment:   6 month(s)  The format for your next appointment:   In Person  Provider:    You may see Debbe Odea, MD or one of the following Advanced Practice Providers on your designated Care Team:    Nicolasa Ducking, NP  Eula Listen, PA-C  Marisue Ivan, PA-C   Other Instructions N/A

## 2019-08-22 NOTE — Progress Notes (Signed)
Cardiology Office Note:    Date:  08/22/2019   ID:  Darren Mason, DOB [birth year], MRN [id number]  PCP:  Doreene Nest, NP  Cardiologist:  Debbe Odea, MD  Electrophysiologist:  None   Referring MD: Doreene Nest, NP   Chief Complaint  Patient presents with  . other    1 month follow up. "doing well."     History of Present Illness:    Darren Mason is a 39 y.o. male with a hx of ADHD, anxiety, testosterone deficiency who presents for follow-up.  He was last seen due to elevated blood pressures. Patient takes Vyvanse for ADHD and testosterone cypionate for low testosterone.  Has been taking both medications for over 10 years now.  He states both medications has changed his quality of life.  He has been seen in the emergency room on 06/2019 for hypertension and tachycardia with heart rates of 128, BP 159/110.  Vyvanse and testosterone were stopped and losartan 100 mg daily started.      He subsequently saw urology for regular visit due to erectile dysfunction. Blood pressure at that visit was 149/90, pulse 132.  Patient denies any chest pain or shortness of breath both at rest or with exertion.  Denies any history of heart disease.  Endorses history of right hip/back pain with radiculopathy.  He is seen pain management.    Coreg was started after last visit as patient is likely to restart Vyvanse and testosterone.  He plans to follow-up with prescribing physician next week to restart.  Past Medical History:  Diagnosis Date  . ADHD   . Anxiety and depression   . Chronic back pain   . Osteoarthritis   . Serotonin syndrome   . Sleep apnea    CPAP machine   . Testosterone deficiency     Past Surgical History:  Procedure Laterality Date  . APPENDECTOMY  2001    Current Medications: Current Meds  Medication Sig  . buPROPion (WELLBUTRIN XL) 300 MG 24 hr tablet Take 300 mg by mouth daily.   . carisoprodol (SOMA) 350 MG tablet Take 350 mg by mouth 3 (three) times  daily as needed.   . carvedilol (COREG) 12.5 MG tablet Take 1 tablet (12.5 mg total) by mouth 2 (two) times daily.  . diclofenac (VOLTAREN) 75 MG EC tablet Take 75 mg by mouth 2 (two) times daily.   Marland Kitchen escitalopram (LEXAPRO) 10 MG tablet Take 10 mg by mouth daily.  . fesoterodine (TOVIAZ) 4 MG TB24 tablet Take 1 tablet (4 mg total) by mouth daily.  Marland Kitchen lisdexamfetamine (VYVANSE) 50 MG capsule Take 50 mg by mouth daily.  Marland Kitchen losartan (COZAAR) 100 MG tablet Take 100 mg by mouth daily.  Marland Kitchen oxybutynin (DITROPAN-XL) 10 MG 24 hr tablet Take 1 tablet (10 mg total) by mouth daily.  Marland Kitchen oxyCODONE-acetaminophen (PERCOCET) 10-325 MG tablet Take 1 tablet by mouth 4 (four) times daily as needed for pain.  . phenazopyridine (PYRIDIUM) 200 MG tablet Take 1 tablet (200 mg total) by mouth 3 (three) times daily as needed for pain.  . pregabalin (LYRICA) 150 MG capsule Take 150 mg by mouth 2 (two) times daily.  . sildenafil (VIAGRA) 100 MG tablet Take 1 tablet (100 mg total) by mouth daily as needed for erectile dysfunction. Take two hours prior to intercourse on an empty stomach  . testosterone cypionate (DEPOTESTOSTERONE CYPIONATE) 200 MG/ML injection Inject 200 mg into the muscle See admin instructions. Inject 200 mg IM every 10 days  . [  DISCONTINUED] carvedilol (COREG) 6.25 MG tablet Take 1 tablet (6.25 mg total) by mouth 2 (two) times daily.     Allergies:   Patient has no known allergies.   Social History   Socioeconomic History  . Marital status: Married    Spouse name: Not on file  . Number of children: Not on file  . Years of education: Not on file  . Highest education level: Not on file  Occupational History  . Not on file  Tobacco Use  . Smoking status: Never Smoker  . Smokeless tobacco: Current User    Types: Chew  Substance and Sexual Activity  . Alcohol use: Yes  . Drug use: No  . Sexual activity: Not on file  Other Topics Concern  . Not on file  Social History Narrative  . Not on file    Social Determinants of Health   Financial Resource Strain:   . Difficulty of Paying Living Expenses: Not on file  Food Insecurity:   . Worried About Programme researcher, broadcasting/film/video in the Last Year: Not on file  . Ran Out of Food in the Last Year: Not on file  Transportation Needs:   . Lack of Transportation (Medical): Not on file  . Lack of Transportation (Non-Medical): Not on file  Physical Activity:   . Days of Exercise per Week: Not on file  . Minutes of Exercise per Session: Not on file  Stress:   . Feeling of Stress : Not on file  Social Connections:   . Frequency of Communication with Friends and Family: Not on file  . Frequency of Social Gatherings with Friends and Family: Not on file  . Attends Religious Services: Not on file  . Active Member of Clubs or Organizations: Not on file  . Attends Banker Meetings: Not on file  . Marital Status: Not on file     Family History: The patient's family history includes Colon cancer in his maternal grandmother; Hyperlipidemia in his father and mother; Hypertension in his father and mother. There is no history of Prostate cancer, Kidney cancer, or Bladder Cancer.  ROS:   Please see the history of present illness.     All other systems reviewed and are negative.  EKGs/Labs/Other Studies Reviewed:    The following studies were reviewed today:   EKG:  EKG is  ordered today.  The ekg ordered today demonstrates normal sinus rhythm, normal ECG.  Normal ECG.  Recent Labs: 06/16/2019: BUN 14; Creatinine, Ser 1.35; Hemoglobin 15.8; Platelets 209; Potassium 3.7; Sodium 137; TSH 0.668  Recent Lipid Panel    Component Value Date/Time   TRIG 266 (H) 01/19/2017 1912    Physical Exam:    VS:  BP (!) 140/92 (BP Location: Left Arm, Patient Position: Sitting, Cuff Size: Normal)   Pulse 86   Ht 6\' 1"  (1.854 m)   Wt 227 lb 12 oz (103.3 kg)   SpO2 98%   BMI 30.05 kg/m     Wt Readings from Last 3 Encounters:  08/22/19 227 lb 12 oz  (103.3 kg)  07/22/19 231 lb 4 oz (104.9 kg)  07/19/19 220 lb (99.8 kg)     GEN:  Well nourished, well developed, mild distress HEENT: Normal NECK: No JVD; No carotid bruits LYMPHATICS: No lymphadenopathy CARDIAC: RRR, no murmurs, rubs, gallops RESPIRATORY:  Clear to auscultation without rales, wheezing or rhonchi  ABDOMEN: Soft, non-tender, non-distended MUSCULOSKELETAL:  No edema; No deformity  SKIN: Warm and dry NEUROLOGIC:  Alert and  oriented x 3 PSYCHIATRIC:  Normal affect   ASSESSMENT:    1. Essential hypertension   2. Paroxysmal tachycardia (HCC)    PLAN:    In order of problems listed above:  1. Blood pressures have improved but still elevated at 140/92.  Heart rate 86.  Causes for hypertension include being distress/pain, anxiety.  Testosterone and Vyvanse where definitely contributing to elevated blood pressures.  Continue losartan 100 mg daily.  Increase carvedilol to 12.5 mg twice daily.  Will titrate as necessary.  From discussion with patient it seems he will resume taking Vyvanse and testosterone at some point which will undoubtedly increase his BP and heart rates.  Additional BP meds for blood pressure control will be needed at that point.   2. History of tachycardia improved.  This was likely due to distress/pain, anxiety, medication side effects.  Increase Coreg to 12.5mg  twice daily as above.   Follow-up in 6 months   This note was generated in part or whole with voice recognition software. Voice recognition is usually quite accurate but there are transcription errors that can and very often do occur. I apologize for any typographical errors that were not detected and corrected.  Medication Adjustments/Labs and Tests Ordered: Current medicines are reviewed at length with the patient today.  Concerns regarding medicines are outlined above.  Orders Placed This Encounter  Procedures  . EKG 12-Lead   Meds ordered this encounter  Medications  . carvedilol  (COREG) 12.5 MG tablet    Sig: Take 1 tablet (12.5 mg total) by mouth 2 (two) times daily.    Dispense:  180 tablet    Refill:  2    Dosage Increase    Patient Instructions  Medication Instructions:  Your physician has recommended you make the following change in your medication:   INCREASE Carvedilol to 12.5 mg twice daily. An Rx has been sent to your pharmacy.  *If you need a refill on your cardiac medications before your next appointment, please call your pharmacy*  Lab Work: None ordered  If you have labs (blood work) drawn today and your tests are completely normal, you will receive your results only by: Marland Kitchen MyChart Message (if you have MyChart) OR . A paper copy in the mail If you have any lab test that is abnormal or we need to change your treatment, we will call you to review the results.  Testing/Procedures: None ordered  Follow-Up: At [facility], you and your health needs are our priority.  As part of our continuing mission to provide you with exceptional heart care, we have created designated Provider Care Teams.  These Care Teams include your primary Cardiologist (physician) and Advanced Practice Providers (APPs -  Physician Assistants and Nurse Practitioners) who all work together to provide you with the care you need, when you need it.  Your next appointment:   6 month(s)  The format for your next appointment:   In Person  Provider:    You may see Kate Sable, MD or one of the following Advanced Practice Providers on your designated Care Team:    Murray Hodgkins, NP  Christell Faith, PA-C  Marrianne Mood, PA-C   Other Instructions N/A     Signed, Kate Sable, MD  08/22/2019 9:36 AM    [place]

## 2019-08-30 ENCOUNTER — Ambulatory Visit: Payer: 59 | Admitting: Psychology

## 2019-09-13 ENCOUNTER — Other Ambulatory Visit: Payer: Self-pay

## 2019-09-13 ENCOUNTER — Ambulatory Visit: Payer: Managed Care, Other (non HMO) | Admitting: Urology

## 2019-09-13 ENCOUNTER — Encounter: Payer: Self-pay | Admitting: Urology

## 2019-09-13 VITALS — BP 147/102 | HR 98 | Ht 73.0 in | Wt 220.0 lb

## 2019-09-13 DIAGNOSIS — R399 Unspecified symptoms and signs involving the genitourinary system: Secondary | ICD-10-CM

## 2019-09-13 DIAGNOSIS — R3915 Urgency of urination: Secondary | ICD-10-CM | POA: Diagnosis not present

## 2019-09-13 LAB — BLADDER SCAN AMB NON-IMAGING: Scan Result: 27

## 2019-09-13 NOTE — Progress Notes (Signed)
09/13/2019  12:14 PM   [name] [birth year] [id number]  Referring provider: Pleas Koch, NP [place],  [place] [address]  Chief Complaint  Patient presents with  . Recurrent UTI    HPI: Darren Mason is a 39 y.o. male with LU TS, ED and testosterone deficiency who presents today for follow up after a trial of oxybutynin XL 10 mg daily.   BPH WITH LUTS  (prostate and/or bladder) IPSS score: 18/2    PVR: 0 mL   Previous score: 25/3     Previous PVR: 0 mL  Major complaint(s): Incomplete emptying, frequency, intermittency, urgency and weak urinary stream.  Patient denies any modifying or aggravating factors.  Patient denies any gross hematuria, dysuria or suprapubic/flank pain.  Patient denies any fevers, chills, nausea or vomiting.   He feels oxybutynin has reduced his urinary frequency and nocturia, but his urgency is still bothersome to him.  IPSS    Row Name 09/13/19 1500         International Prostate Symptom Score   How often have you had the sensation of not emptying your bladder?  More than half the time     How often have you had to urinate less than every two hours?  Less than half the time     How often have you found you stopped and started again several times when you urinated?  About half the time     How often have you found it difficult to postpone urination?  Almost always     How often have you had a weak urinary stream?  Less than half the time     How often have you had to strain to start urination?  Less than 1 in 5 times     How many times did you typically get up at night to urinate?  1 Time     Total IPSS Score  18       Quality of Life due to urinary symptoms   If you were to spend the rest of your life with your urinary condition just the way it is now how would you feel about that?  Mostly Satisfied        Score:  1-7 Mild 8-19 Moderate 20-35 Severe  Erectile dysfunction SHIM score: 29    Previous SHIM score: 24 Main  complaint: Losing erection x four years Risk factors:  age, BPH, testosterone deficiency, HTN, anxiety and depression.  No  painful erections or curvatures with his erections.    Still having spontaneous erections.  Tried: Great results with Viagra   SHIM    Row Name 09/13/19 1520         SHIM: Over the last 6 months:   How do you rate your confidence that you could get and keep an erection?  Very High     When you had erections with sexual stimulation, how often were your erections hard enough for penetration (entering your partner)?  Almost Always or Always     During sexual intercourse, how often were you able to maintain your erection after you had penetrated (entered) your partner?  Almost Always or Always     During sexual intercourse, how difficult was it to maintain your erection to completion of intercourse?  Not Difficult     When you attempted sexual intercourse, how often was it satisfactory for you?  Most Times (much more than half the time)       SHIM  Total Score   SHIM  24        Score: 1-7 Severe ED 8-11 Moderate ED 12-16 Mild-Moderate ED 17-21 Mild ED 22-25 No ED   PMH: Past Medical History:  Diagnosis Date  . ADHD   . Anxiety and depression   . Chronic back pain   . Osteoarthritis   . Serotonin syndrome   . Sleep apnea    CPAP machine   . Testosterone deficiency     Surgical History: Past Surgical History:  Procedure Laterality Date  . APPENDECTOMY  2001    Home Medications:  Allergies as of 09/13/2019   No Known Allergies     Medication List       Accurate as of September 13, 2019 11:59 PM. If you have any questions, ask your nurse or doctor.        buPROPion 300 MG 24 hr tablet Commonly known as: WELLBUTRIN XL Take 300 mg by mouth daily.   carisoprodol 350 MG tablet Commonly known as: SOMA Take 350 mg by mouth 3 (three) times daily as needed.   carvedilol 12.5 MG tablet Commonly known as: COREG Take 1 tablet (12.5 mg total) by  mouth 2 (two) times daily.   diclofenac 75 MG EC tablet Commonly known as: VOLTAREN Take 75 mg by mouth 2 (two) times daily.   escitalopram 10 MG tablet Commonly known as: LEXAPRO Take 10 mg by mouth daily.   fesoterodine 4 MG Tb24 tablet Commonly known as: Toviaz Take 1 tablet (4 mg total) by mouth daily.   lisdexamfetamine 50 MG capsule Commonly known as: VYVANSE Take 50 mg by mouth daily.   losartan 100 MG tablet Commonly known as: COZAAR Take 100 mg by mouth daily.   oxybutynin 10 MG 24 hr tablet Commonly known as: DITROPAN-XL Take 1 tablet (10 mg total) by mouth daily.   oxyCODONE-acetaminophen 10-325 MG tablet Commonly known as: PERCOCET Take 1 tablet by mouth 4 (four) times daily as needed for pain.   phenazopyridine 200 MG tablet Commonly known as: Pyridium Take 1 tablet (200 mg total) by mouth 3 (three) times daily as needed for pain.   pregabalin 150 MG capsule Commonly known as: LYRICA Take 150 mg by mouth 2 (two) times daily.   sildenafil 100 MG tablet Commonly known as: VIAGRA Take 1 tablet (100 mg total) by mouth daily as needed for erectile dysfunction. Take two hours prior to intercourse on an empty stomach   testosterone cypionate 200 MG/ML injection Commonly known as: DEPOTESTOSTERONE CYPIONATE Inject 200 mg into the muscle See admin instructions. Inject 200 mg IM every 10 days       Allergies: No Known Allergies  Family History: Family History  Problem Relation Age of Onset  . Hypertension Mother   . Hyperlipidemia Mother   . Colon cancer Maternal Grandmother   . Hypertension Father   . Hyperlipidemia Father   . Prostate cancer Neg Hx   . Kidney cancer Neg Hx   . Bladder Cancer Neg Hx    Social History:  reports that he has never smoked. His smokeless tobacco use includes chew. He reports current alcohol use. He reports that he does not use drugs.  ROS: For pertinent review of systems please refer to history of present  illness  Physical Exam: BP (!) 147/102   Pulse 98   Ht 6\' 1"  (1.854 m)   Wt 220 lb (99.8 kg)   BMI 29.03 kg/m   Constitutional:  Well nourished. Alert and oriented,  No acute distress. HEENT: [place] AT, mask in place.  Trachea midline, no masses. Cardiovascular: No clubbing, cyanosis, or edema. Respiratory: Normal respiratory effort, no increased work of breathing. Neurologic: Grossly intact, no focal deficits, moving all 4 extremities. Psychiatric: Normal mood and affect.   Pertinent Imaging: Results for JEREMIYAH, CULLENS (MRN [id number]) as of 09/20/2019 12:10  Ref. Range 09/13/2019 15:45  Scan Result Unknown 27    Assessment & Plan:    1. LU TS Improved with oxybutynin XL 10 mg daily Continue the oxybutynin  2. Urgency Patient could not provide an urine specimen today RTC for UA, cytology and urine culture   Return in about 2 weeks (around 09/27/2019) for UA, urine culture and cytology .  These notes generated with voice recognition software. I apologize for typographical errors.  Hulan Fray   [facility] [address] [place], [place] [address] [phone]

## 2019-09-16 ENCOUNTER — Ambulatory Visit: Payer: 59 | Admitting: Psychology

## 2019-09-19 ENCOUNTER — Other Ambulatory Visit: Payer: Self-pay

## 2019-09-19 ENCOUNTER — Encounter: Payer: Self-pay | Admitting: Urology

## 2019-09-20 ENCOUNTER — Telehealth: Payer: Self-pay | Admitting: Urology

## 2019-09-20 NOTE — Telephone Encounter (Signed)
Please call Darren Mason and have him reschedule his missed lab appointment from yesterday for UA, urine culture and urine cytology.

## 2019-09-20 NOTE — Telephone Encounter (Signed)
Spoke to patient and rescheduled appointment on Nurse schedule so we can send out urine Cytology

## 2019-09-23 ENCOUNTER — Ambulatory Visit: Payer: Managed Care, Other (non HMO) | Attending: Internal Medicine

## 2019-09-23 DIAGNOSIS — Z23 Encounter for immunization: Secondary | ICD-10-CM

## 2019-09-23 NOTE — Progress Notes (Signed)
   Covid-19 Vaccination Clinic  Name:  Bernd Crom    MRN: [id number] DOB: Nov 05, 1980  09/23/2019  Mr. Baetz was observed post Covid-19 immunization for 15 minutes without incident. He was provided with Vaccine Information Sheet and instruction to access the V-Safe system.   Mr. Berrios was instructed to call 911 with any severe reactions post vaccine: Marland Kitchen Difficulty breathing  . Swelling of face and throat  . A fast heartbeat  . A bad rash all over body  . Dizziness and weakness   Immunizations Administered    Name Date Dose VIS Date Route   Pfizer COVID-19 Vaccine 09/23/2019  2:46 AM 0.3 mL 06/17/2019 Intramuscular   Manufacturer: [employer], [employer]   Lot: [id number]   NDC: [address]-9

## 2019-09-26 ENCOUNTER — Other Ambulatory Visit: Payer: Self-pay

## 2019-09-26 ENCOUNTER — Other Ambulatory Visit: Payer: Managed Care, Other (non HMO)

## 2019-09-26 DIAGNOSIS — R399 Unspecified symptoms and signs involving the genitourinary system: Secondary | ICD-10-CM

## 2019-09-26 LAB — URINALYSIS, COMPLETE
Bilirubin, UA: NEGATIVE
Glucose, UA: NEGATIVE
Ketones, UA: NEGATIVE
Leukocytes,UA: NEGATIVE
Nitrite, UA: NEGATIVE
Protein,UA: NEGATIVE
Specific Gravity, UA: 1.02 (ref 1.005–1.030)
Urobilinogen, Ur: 0.2 mg/dL (ref 0.2–1.0)
pH, UA: 5 (ref 5.0–7.5)

## 2019-09-26 LAB — MICROSCOPIC EXAMINATION
Bacteria, UA: NONE SEEN
Epithelial Cells (non renal): NONE SEEN /hpf (ref 0–10)
RBC, Urine: NONE SEEN /hpf (ref 0–2)
WBC, UA: NONE SEEN /hpf (ref 0–5)

## 2019-09-29 ENCOUNTER — Other Ambulatory Visit: Payer: Self-pay | Admitting: Urology

## 2019-09-29 LAB — CULTURE, URINE COMPREHENSIVE

## 2019-10-14 ENCOUNTER — Ambulatory Visit: Payer: Managed Care, Other (non HMO) | Attending: Internal Medicine

## 2019-10-14 DIAGNOSIS — Z23 Encounter for immunization: Secondary | ICD-10-CM

## 2019-10-14 NOTE — Progress Notes (Signed)
   Covid-19 Vaccination Clinic  Name:  Darren Mason    MRN: [id number] DOB: 01-Sep-1980  10/14/2019  [name] was observed post Covid-19 immunization for 15 minutes without incident. He was provided with Vaccine Information Sheet and instruction to access the V-Safe system.   [name] was instructed to call 911 with any severe reactions post vaccine: Marland Kitchen Difficulty breathing  . Swelling of face and throat  . A fast heartbeat  . A bad rash all over body  . Dizziness and weakness   Immunizations Administered    Name Date Dose VIS Date Route   Pfizer COVID-19 Vaccine 10/14/2019  2:36 PM 0.3 mL 06/17/2019 Intramuscular   Manufacturer: [employer], [employer]   Lot: [id number]   NDC: [address]-2

## 2019-10-17 ENCOUNTER — Ambulatory Visit (INDEPENDENT_AMBULATORY_CARE_PROVIDER_SITE_OTHER): Payer: 59 | Admitting: Psychology

## 2019-10-17 DIAGNOSIS — F411 Generalized anxiety disorder: Secondary | ICD-10-CM | POA: Diagnosis not present

## 2019-10-18 ENCOUNTER — Ambulatory Visit: Payer: Managed Care, Other (non HMO) | Admitting: Urology

## 2019-10-18 ENCOUNTER — Other Ambulatory Visit: Payer: Self-pay

## 2019-10-18 ENCOUNTER — Encounter: Payer: Self-pay | Admitting: Urology

## 2019-10-18 VITALS — BP 128/89 | HR 91 | Ht 73.0 in | Wt 222.8 lb

## 2019-10-18 DIAGNOSIS — R3915 Urgency of urination: Secondary | ICD-10-CM

## 2019-10-18 DIAGNOSIS — E349 Endocrine disorder, unspecified: Secondary | ICD-10-CM

## 2019-10-18 NOTE — Progress Notes (Signed)
10/18/2019 08:30 AM  [name] 04-Apr-1981 [id number]  Referring provider: Doreene Nest, NP [address] [place],  [place] [address]  Chief Complaint  Patient presents with  . Hypogonadism    HPI: Darren Mason is a 39 y.o. male presenting today for a testosterone deficiency consultation. He has a history of  frequency, nocturia and ED.  Darren Mason was released from pcp facility in [place]. His pcp, Dr. Truddie Coco, is leaving the practice.   He first started taking testosterone replacement injections approx. 10 yrs ago by Dr. Isaac Bliss. He states that extensive blood work was drawn from pcp in order to test for and confirm low testosterone. He states that used to get an injection once q 10, now it has decreased to once q 12. He first started on injections, then changed to using a testoerone gel a few years ago, then went back to using injections. The pt can't recall if he started taking pain medication before or after starting testosterone replacement therapy, so he is unsure if the pain medication is what caused his low levels. He believes that it was around the same time. He states that the levels could stem from stress, lack of sleep, but he doesn't know certainly what is causing low levels of testosterone.  The last time he has had an injection was about 5-6 wks  Had been on Oxybutynin 10 mg daily with improvement in frequency, but is still is experiencing urgency.  He states that he has experienced pedal edema off and on.  The pt is still taking Viagra and states that it is helping his erections.  He sleeps with CPAP every night for his sleep apnea.  The pt is concerned with starting treatment as soon as he can and wanted to know the timeline of starting the treatment.   PMH: Past Medical History:  Diagnosis Date  . ADHD   . Anxiety and depression   . Chronic back pain   . Osteoarthritis   . Serotonin syndrome   . Sleep apnea    CPAP machine   . Testosterone  deficiency     Surgical History: Past Surgical History:  Procedure Laterality Date  . APPENDECTOMY  2001    Home Medications:  Allergies as of 10/18/2019   No Known Allergies     Medication List       Accurate as of October 18, 2019  1:25 PM. If you have any questions, ask your nurse or doctor.        STOP taking these medications   escitalopram 10 MG tablet Commonly known as: LEXAPRO Stopped by: Michiel Cowboy, PA-C   fesoterodine 4 MG Tb24 tablet Commonly known as: Toviaz Stopped by: Michiel Cowboy, PA-C   phenazopyridine 200 MG tablet Commonly known as: Pyridium Stopped by: Michiel Cowboy, PA-C     TAKE these medications   buPROPion 300 MG 24 hr tablet Commonly known as: WELLBUTRIN XL Take 300 mg by mouth daily.   carisoprodol 350 MG tablet Commonly known as: SOMA Take 350 mg by mouth 3 (three) times daily as needed.   carvedilol 12.5 MG tablet Commonly known as: COREG Take 1 tablet (12.5 mg total) by mouth 2 (two) times daily.   diclofenac 75 MG EC tablet Commonly known as: VOLTAREN Take 75 mg by mouth 2 (two) times daily.   lisdexamfetamine 50 MG capsule Commonly known as: VYVANSE Take 50 mg by mouth daily.   losartan 100 MG tablet Commonly known as: COZAAR Take  100 mg by mouth daily.   oxybutynin 10 MG 24 hr tablet Commonly known as: DITROPAN-XL Take 1 tablet (10 mg total) by mouth daily.   oxyCODONE-acetaminophen 10-325 MG tablet Commonly known as: PERCOCET Take 1 tablet by mouth 4 (four) times daily as needed for pain.   pregabalin 150 MG capsule Commonly known as: LYRICA Take 150 mg by mouth 2 (two) times daily.   PreviDent 5000 Dry Mouth 1.1 % Gel dental gel Generic drug: sodium fluoride USE THREE TIMES A DAY AFTER MEALS. DO NOT RINSE.    SLS FREE WILL NOT FOAM. What changed: Another medication with the same name was removed. Continue taking this medication, and follow the directions you see here. Changed by: Michiel Cowboy,  PA-C   sildenafil 100 MG tablet Commonly known as: VIAGRA Take 1 tablet (100 mg total) by mouth daily as needed for erectile dysfunction. Take two hours prior to intercourse on an empty stomach   testosterone cypionate 200 MG/ML injection Commonly known as: DEPOTESTOSTERONE CYPIONATE Inject 200 mg into the muscle See admin instructions. Inject 200 mg IM every 10 days       Allergies: No Known Allergies  Family History: Family History  Problem Relation Age of Onset  . Hypertension Mother   . Hyperlipidemia Mother   . Colon cancer Maternal Grandmother   . Hypertension Father   . Hyperlipidemia Father   . Prostate cancer Neg Hx   . Kidney cancer Neg Hx   . Bladder Cancer Neg Hx     Social History:  reports that he has never smoked. His smokeless tobacco use includes chew. He reports current alcohol use. He reports that he does not use drugs.   Physical Exam: BP 128/89   Pulse 91   Ht 6\' 1"  (1.854 m)   Wt 222 lb 12.8 oz (101.1 kg)   BMI 29.39 kg/m   Constitutional:  Alert and oriented, No acute distress. HEENT: [place] AT, moist mucus membranes.  Trachea midline, no masses. Cardiovascular: No clubbing, cyanosis, or edema. Respiratory: Normal respiratory effort, no increased work of breathing. GI: Abdomen is soft, nontender, nondistended, no abdominal masses GU: No CVA tenderness Lymph: No cervical or inguinal lymphadenopathy. Skin: No rashes, bruises or suspicious lesions. Neurologic: Grossly intact, no focal deficits, moving all 4 extremities. Psychiatric: Normal mood and affect.  Laboratory Data: Lab Results  Component Value Date   WBC 7.2 06/16/2019   HGB 15.8 06/16/2019   HCT 45.7 06/16/2019   MCV 85.9 06/16/2019   PLT 209 06/16/2019    Lab Results  Component Value Date   CREATININE 1.35 (H) 06/16/2019      Urinalysis Results for orders placed or performed in visit on 09/26/19  CULTURE, URINE COMPREHENSIVE   Specimen: Urine   UR  Result Value Ref  Range   Urine Culture, Comprehensive Final report (A)    Organism ID, Bacteria Enterococcus faecalis (A)    Organism ID, Bacteria Comment (A)    ANTIMICROBIAL SUSCEPTIBILITY Comment   Microscopic Examination   URINE  Result Value Ref Range   WBC, UA None seen 0 - 5 /hpf   RBC None seen 0 - 2 /hpf   Epithelial Cells (non renal) None seen 0 - 10 /hpf   Bacteria, UA None seen None seen/Few  Urinalysis, Complete  Result Value Ref Range   Specific Gravity, UA 1.020 1.005 - 1.030   pH, UA 5.0 5.0 - 7.5   Color, UA Yellow Yellow   Appearance Ur Clear Clear  Leukocytes,UA Negative Negative   Protein,UA Negative Negative/Trace   Glucose, UA Negative Negative   Ketones, UA Negative Negative   RBC, UA Trace (A) Negative   Bilirubin, UA Negative Negative   Urobilinogen, Ur 0.2 0.2 - 1.0 mg/dL   Nitrite, UA Negative Negative   Microscopic Examination See below:     Assessment & Plan:    1. Testosterone Deficiency Will check morning testosterone level today and if it returns below 300 mg/dL will repeat a morning testosterone before 10 am on Friday.  2. Urgency Will schedule an in office cystoscopy to evaluate for refractory urgency. I have explained to the patient that they will  be scheduled for a cystoscopy in our office to evaluate their bladder.  The cystoscopy consists of passing a tube with a lens up through their urethra and into their urinary bladder.   We will inject the urethra with a lidocaine gel prior to introducing the cystoscope to help with any discomfort during the procedure.   After the procedure, they might experience blood in the urine and discomfort with urination.  This will abate after the first few voids.  I have  encouraged the patient to increase water intake  during this time.  Patient denies any allergies to lidocaine.     [facility] [address] [place], [place] [address] [phone]  I,  Noland Fordyce, am acting as a Education administrator for [employer], [place].  I have reviewed the above documentation for accuracy and completeness, and I agree with the above.    Zara Council, PA-C

## 2019-10-19 ENCOUNTER — Telehealth: Payer: Self-pay | Admitting: Family Medicine

## 2019-10-19 ENCOUNTER — Other Ambulatory Visit: Payer: Self-pay | Admitting: Family Medicine

## 2019-10-19 DIAGNOSIS — E349 Endocrine disorder, unspecified: Secondary | ICD-10-CM

## 2019-10-19 LAB — TESTOSTERONE: Testosterone: 9 ng/dL — ABNORMAL LOW (ref 264–916)

## 2019-10-19 NOTE — Telephone Encounter (Signed)
Patient notified and voiced understanding. Appointment for lab is made.

## 2019-10-19 NOTE — Telephone Encounter (Signed)
-----   Message from [name], PA-C sent at 10/19/2019  9:02 AM EDT ----- Please let Darren Mason know that his testosterone level returned well below 300.  We need to have his testosterone repeated, but he cannot have his blood work earlier than Friday.  He needs to have a testosterone level before 10 am, LH, FSH and prolactin as well.

## 2019-10-19 NOTE — Telephone Encounter (Signed)
LMOM for patient to return call.

## 2019-10-24 ENCOUNTER — Other Ambulatory Visit: Payer: Managed Care, Other (non HMO)

## 2019-10-24 ENCOUNTER — Other Ambulatory Visit: Payer: Self-pay

## 2019-10-24 DIAGNOSIS — E349 Endocrine disorder, unspecified: Secondary | ICD-10-CM

## 2019-10-25 ENCOUNTER — Telehealth: Payer: Self-pay | Admitting: Family Medicine

## 2019-10-25 LAB — HEPATIC FUNCTION PANEL
ALT: 101 IU/L — ABNORMAL HIGH (ref 0–44)
AST: 44 IU/L — ABNORMAL HIGH (ref 0–40)
Albumin: 4.8 g/dL (ref 4.0–5.0)
Alkaline Phosphatase: 106 IU/L (ref 39–117)
Bilirubin Total: 0.3 mg/dL (ref 0.0–1.2)
Bilirubin, Direct: 0.12 mg/dL (ref 0.00–0.40)
Total Protein: 7.5 g/dL (ref 6.0–8.5)

## 2019-10-25 LAB — TESTOSTERONE: Testosterone: 12 ng/dL — ABNORMAL LOW (ref 264–916)

## 2019-10-25 LAB — PROLACTIN: Prolactin: 13.9 ng/mL (ref 4.0–15.2)

## 2019-10-25 LAB — LUTEINIZING HORMONE: LH: 1.1 m[IU]/mL — ABNORMAL LOW (ref 1.7–8.6)

## 2019-10-25 NOTE — Telephone Encounter (Signed)
LMOM for patient to return call.

## 2019-10-25 NOTE — Telephone Encounter (Signed)
-----   Message from [name], PA-C sent at 10/25/2019  8:23 AM EDT ----- Please let Hasnain know that his liver enzymes are elevated.  We cannot proceed with testosterone replacement at this time.  He needs to see his PCP regarding this issue.

## 2019-10-26 LAB — FOLLICLE STIMULATING HORMONE: FSH: 1.1 m[IU]/mL — ABNORMAL LOW (ref 1.5–12.4)

## 2019-10-26 LAB — SPECIMEN STATUS REPORT

## 2019-10-26 NOTE — Telephone Encounter (Signed)
Patient notified and voiced understanding.

## 2019-10-26 NOTE — Telephone Encounter (Signed)
Pt returning your call asking about his results. Please advise.

## 2019-11-04 ENCOUNTER — Ambulatory Visit (INDEPENDENT_AMBULATORY_CARE_PROVIDER_SITE_OTHER): Payer: Managed Care, Other (non HMO) | Admitting: Primary Care

## 2019-11-04 ENCOUNTER — Encounter: Payer: Self-pay | Admitting: Primary Care

## 2019-11-04 ENCOUNTER — Other Ambulatory Visit: Payer: Self-pay

## 2019-11-04 VITALS — BP 136/86 | HR 97 | Temp 97.2°F | Ht 71.5 in | Wt 221.5 lb

## 2019-11-04 DIAGNOSIS — F419 Anxiety disorder, unspecified: Secondary | ICD-10-CM

## 2019-11-04 DIAGNOSIS — I10 Essential (primary) hypertension: Secondary | ICD-10-CM

## 2019-11-04 DIAGNOSIS — F909 Attention-deficit hyperactivity disorder, unspecified type: Secondary | ICD-10-CM | POA: Diagnosis not present

## 2019-11-04 DIAGNOSIS — R7989 Other specified abnormal findings of blood chemistry: Secondary | ICD-10-CM | POA: Diagnosis not present

## 2019-11-04 DIAGNOSIS — E349 Endocrine disorder, unspecified: Secondary | ICD-10-CM

## 2019-11-04 DIAGNOSIS — M5440 Lumbago with sciatica, unspecified side: Secondary | ICD-10-CM

## 2019-11-04 DIAGNOSIS — G8929 Other chronic pain: Secondary | ICD-10-CM

## 2019-11-04 DIAGNOSIS — F329 Major depressive disorder, single episode, unspecified: Secondary | ICD-10-CM

## 2019-11-04 DIAGNOSIS — K760 Fatty (change of) liver, not elsewhere classified: Secondary | ICD-10-CM

## 2019-11-04 DIAGNOSIS — M1990 Unspecified osteoarthritis, unspecified site: Secondary | ICD-10-CM

## 2019-11-04 MED ORDER — LOSARTAN POTASSIUM 100 MG PO TABS: 100.0000 mg | ORAL_TABLET | Freq: Every day | ORAL | 3 refills | Status: DC

## 2019-11-04 MED ORDER — BUPROPION HCL ER (XL) 300 MG PO TB24: 300.0000 mg | ORAL_TABLET | Freq: Every day | ORAL | 0 refills | Status: DC

## 2019-11-04 NOTE — Assessment & Plan Note (Signed)
Borderline today, does appear anxious. Continue losartan and carvedilol.

## 2019-11-04 NOTE — Assessment & Plan Note (Signed)
History of serotonin syndrome in 2018, evaluated in the ED. Refill provided for his Wellbutrin. Referral placed to psychiatry for evaluation and management of ADHD.

## 2019-11-04 NOTE — Assessment & Plan Note (Signed)
Following with rheumatology and pain management who prescribe Soma, Lyrica, and Percocet. Continue current regimen.

## 2019-11-04 NOTE — Assessment & Plan Note (Signed)
Evident with recent lab work per Urology. I did review his recent hepatic function and noted elevated LFT's.   [place] from June 2020 reviewed which showed fatty liver.  Given that I have no experience in testosterone replacement treatment, I cannot offer an opinion to whether or not we can proceed with treatment. Will have to defer this to his Urology practice.   We will obtain a repeat US of his liver to evaluate for any changes. Consider GI evaluation for NAFLD.   Will cc notes to Urology.

## 2019-11-04 NOTE — Assessment & Plan Note (Signed)
Following with rheumatology and pain management who prescribe Soma, Lyrica, and Percocet. Continue current regimen. 

## 2019-11-04 NOTE — Progress Notes (Signed)
Subjective:    Patient ID: Darren Mason, male    DOB: Nov 16, 1980, 39 y.o.   MRN: [id number]  HPI  This visit occurred during the SARS-CoV-2 public health emergency.  Safety protocols were in place, including screening questions prior to the visit, additional usage of staff PPE, and extensive cleaning of exam room while observing appropriate contact time as indicated for disinfecting solutions.   Darren Mason is a 39 year old male who presents today to re-establish care with [place] as his prior PCP is retiring.   He has been following with another primary care doctor in [place] for 15 years. He did establish care with [place] in July of 2018, has not been seen since then.  1) Anxiety and Depression/ADHD: Currently following with therapy and is meeting once monthly. He is managed on bupropion XL 300 mg daily, Vyvanse 70 mg daily. He has been managed on Vyvanse for the last 12 years, was getting this prescribed by his prior PCP.   He has been off of his Vyvanse and bupropion for the last month as he couldn't get a refill. Since he's been off of his Vyvanse he's sleeping a lot, has had fatigue.   2) Testosterone Deficiency: Currently following with Urology. Last testosterone level was 12 on 10/24/19. He has noticed chronic fatigue and can feel his low levels. Was previously giving himself testosterone injections every 1-2 weeks, has been doing this for the last 12 years, prescribed by his prior PCP in [place].  Also managed on Viagra 100 mg and oxybutynin.   He is not currently managed on testosterone replacement now as he's recently established with Urology. Urology completed lab work and noted elevated LFT's. He was told to go to his PCP to get "approval" for testosterone replacement therapy. He does not drink alcohol, never has in the past.   BP Readings from Last 3 Encounters:  11/04/19 136/86  10/18/19 128/89  09/13/19 (!) 147/102   3) Chronic Back Pain: Currently following with pain management  and rheumatology and is managed on Soma (Rheum), diclofenac (PCP), Percocet (pain management), Lyrica (Rheum). Overall feels well managed, may undergo a spinal cord stimulator implantation.   4) Essential Hypertension/OSA: Currently managed on losartan 100 mg and carvedilol 12.5 mg BID. Following with cardiology with last visit being in February 2021. Sleeps with CPAP machine nightly.   Review of Systems  Constitutional: Positive for fatigue.  Eyes: Negative for visual disturbance.  Respiratory: Negative for shortness of breath.   Cardiovascular: Negative for chest pain.  Genitourinary:       Erectile dysfunction  Neurological: Negative for dizziness and headaches.  Psychiatric/Behavioral: Positive for decreased concentration. The patient is nervous/anxious.        Past Medical History:  Diagnosis Date  . ADHD   . Anxiety and depression   . Chronic back pain   . Osteoarthritis   . Serotonin syndrome   . Sleep apnea    CPAP machine   . Testosterone deficiency      Social History   Socioeconomic History  . Marital status: Married    Spouse name: Not on file  . Number of children: Not on file  . Years of education: Not on file  . Highest education level: Not on file  Occupational History  . Not on file  Tobacco Use  . Smoking status: Never Smoker  . Smokeless tobacco: Current User    Types: Chew  Substance and Sexual Activity  . Alcohol use: Yes  . Drug  use: No  . Sexual activity: Not on file  Other Topics Concern  . Not on file  Social History Narrative  . Not on file   Social Determinants of Health   Financial Resource Strain:   . Difficulty of Paying Living Expenses:   Food Insecurity:   . Worried About Programme researcher, broadcasting/film/video in the Last Year:   . Barista in the Last Year:   Transportation Needs:   . Freight forwarder (Medical):   Marland Kitchen Lack of Transportation (Non-Medical):   Physical Activity:   . Days of Exercise per Week:   . Minutes of Exercise  per Session:   Stress:   . Feeling of Stress :   Social Connections:   . Frequency of Communication with Friends and Family:   . Frequency of Social Gatherings with Friends and Family:   . Attends Religious Services:   . Active Member of Clubs or Organizations:   . Attends Banker Meetings:   Marland Kitchen Marital Status:   Intimate Partner Violence:   . Fear of Current or Ex-Partner:   . Emotionally Abused:   Marland Kitchen Physically Abused:   . Sexually Abused:     Past Surgical History:  Procedure Laterality Date  . APPENDECTOMY  2001    Family History  Problem Relation Age of Onset  . Hypertension Mother   . Hyperlipidemia Mother   . Colon cancer Maternal Grandmother   . Hypertension Father   . Hyperlipidemia Father   . Prostate cancer Neg Hx   . Kidney cancer Neg Hx   . Bladder Cancer Neg Hx     No Known Allergies  Current Outpatient Medications on File Prior to Visit  Medication Sig Dispense Refill  . carisoprodol (SOMA) 350 MG tablet Take 350 mg by mouth 3 (three) times daily as needed.     . carvedilol (COREG) 12.5 MG tablet Take 1 tablet (12.5 mg total) by mouth 2 (two) times daily. 180 tablet 2  . diclofenac (VOLTAREN) 75 MG EC tablet Take 75 mg by mouth 2 (two) times daily.     Marland Kitchen lisdexamfetamine (VYVANSE) 70 MG capsule Take 70 mg by mouth daily.    Marland Kitchen oxybutynin (DITROPAN-XL) 10 MG 24 hr tablet Take 1 tablet (10 mg total) by mouth daily. 90 tablet 0  . oxyCODONE-acetaminophen (PERCOCET) 10-325 MG tablet Take 1 tablet by mouth 4 (four) times daily as needed for pain.    . pregabalin (LYRICA) 150 MG capsule Take 150 mg by mouth 2 (two) times daily.    Marland Kitchen PREVIDENT 5000 DRY MOUTH 1.1 % GEL dental gel USE THREE TIMES A DAY AFTER MEALS. DO NOT RINSE.    SLS FREE WILL NOT FOAM.    . sildenafil (VIAGRA) 100 MG tablet Take 1 tablet (100 mg total) by mouth daily as needed for erectile dysfunction. Take two hours prior to intercourse on an empty stomach 30 tablet 3  . testosterone  cypionate (DEPOTESTOSTERONE CYPIONATE) 200 MG/ML injection Inject 200 mg into the muscle See admin instructions. Inject 200 mg IM every 10 days     No current facility-administered medications on file prior to visit.    BP 136/86   Pulse 97   Temp (!) 97.2 F (36.2 C) (Temporal)   Ht 5' 11.5" (1.816 m)   Wt 221 lb 8 oz (100.5 kg)   SpO2 98%   BMI 30.46 kg/m    Objective:   Physical Exam  Constitutional: He appears well-nourished.  Cardiovascular:  Normal rate and regular rhythm.  Respiratory: Effort normal and breath sounds normal.  Musculoskeletal:     Cervical back: Neck supple.  Skin: Skin is warm and dry.  Psychiatric: He has a normal mood and affect.           Assessment & Plan:

## 2019-11-04 NOTE — Assessment & Plan Note (Signed)
Noted on [place] from June 2020. 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

## 2019-11-04 NOTE — Assessment & Plan Note (Signed)
Increased since records from 2018. Recent enzyme panel reviewed. Repeat US of liver pending.

## 2019-11-04 NOTE — Assessment & Plan Note (Signed)
Managed on Vyvanse per prior PCP for years. No formal testing. Will refer to psychiatry for evaluation and treatment.

## 2019-11-04 NOTE — Patient Instructions (Addendum)
You will be contacted regarding your referral to psychiatry and for the ultrasound of your liver.  Please let us know if you have not been contacted within two weeks.   I sent refills for bupropion and losartan to [facility].  It was a pleasure to see you today! Welcome back!

## 2019-11-08 ENCOUNTER — Encounter: Payer: Managed Care, Other (non HMO) | Admitting: Primary Care

## 2019-11-09 ENCOUNTER — Ambulatory Visit
Admission: RE | Admit: 2019-11-09 | Discharge: 2019-11-09 | Disposition: A | Discharge status: Home / Self Care | Payer: Managed Care, Other (non HMO) | Source: Ambulatory Visit | Attending: Primary Care | Admitting: Primary Care

## 2019-11-09 ENCOUNTER — Other Ambulatory Visit: Payer: Self-pay

## 2019-11-09 DIAGNOSIS — R7989 Other specified abnormal findings of blood chemistry: Secondary | ICD-10-CM | POA: Diagnosis present

## 2019-11-09 DIAGNOSIS — K760 Fatty (change of) liver, not elsewhere classified: Secondary | ICD-10-CM

## 2019-11-10 ENCOUNTER — Ambulatory Visit (INDEPENDENT_AMBULATORY_CARE_PROVIDER_SITE_OTHER): Payer: 59 | Admitting: Psychology

## 2019-11-10 DIAGNOSIS — R7989 Other specified abnormal findings of blood chemistry: Secondary | ICD-10-CM

## 2019-11-10 DIAGNOSIS — F411 Generalized anxiety disorder: Secondary | ICD-10-CM

## 2019-11-11 ENCOUNTER — Other Ambulatory Visit: Payer: Self-pay | Admitting: Urology

## 2019-11-15 ENCOUNTER — Telehealth: Payer: Self-pay | Admitting: *Deleted

## 2019-11-15 NOTE — Telephone Encounter (Signed)
Patient called in today and states that his PCP dont know much about Testosterone. He got his liver test done and would like to know where he goes from here?

## 2019-11-15 NOTE — Telephone Encounter (Signed)
I need to clarify.  He cannot have testosterone medication with elevated LFT's.  He needs to find out why they are elevated and if it can be corrected.  If his liver issues can be corrected and his LFT's return to normal, then we can prescribe testosterone.

## 2019-11-16 NOTE — Telephone Encounter (Signed)
LMOM for patient to return call.

## 2019-11-17 ENCOUNTER — Telehealth: Payer: Self-pay | Admitting: Urology

## 2019-11-17 NOTE — Telephone Encounter (Signed)
2nd atempt, [facility] for patient to return call.

## 2019-11-17 NOTE — Telephone Encounter (Signed)
Patient notified and voiced understanding.

## 2019-11-17 NOTE — Telephone Encounter (Signed)
Pt LMOM returning your call from yesterday.  Please give him a call [phone]

## 2019-11-19 ENCOUNTER — Telehealth (INDEPENDENT_AMBULATORY_CARE_PROVIDER_SITE_OTHER): Payer: 59 | Admitting: Psychiatry

## 2019-11-19 ENCOUNTER — Encounter (HOSPITAL_COMMUNITY): Payer: Self-pay | Admitting: Psychiatry

## 2019-11-19 ENCOUNTER — Other Ambulatory Visit: Payer: Self-pay

## 2019-11-19 DIAGNOSIS — F419 Anxiety disorder, unspecified: Secondary | ICD-10-CM | POA: Diagnosis not present

## 2019-11-19 DIAGNOSIS — F909 Attention-deficit hyperactivity disorder, unspecified type: Secondary | ICD-10-CM

## 2019-11-19 DIAGNOSIS — F329 Major depressive disorder, single episode, unspecified: Secondary | ICD-10-CM

## 2019-11-19 MED ORDER — BUPROPION HCL ER (XL) 300 MG PO TB24: 300.0000 mg | ORAL_TABLET | Freq: Every day | ORAL | 0 refills | Status: DC

## 2019-11-19 MED ORDER — LISDEXAMFETAMINE DIMESYLATE 70 MG PO CAPS
70.0000 mg | ORAL_CAPSULE | Freq: Every day | ORAL | 0 refills | Status: DC
Start: 2019-11-19 — End: 2020-02-15

## 2019-11-19 MED ORDER — LISDEXAMFETAMINE DIMESYLATE 70 MG PO CAPS
70.0000 mg | ORAL_CAPSULE | Freq: Every day | ORAL | 0 refills | Status: DC
Start: 2020-01-18 — End: 2020-02-15

## 2019-11-19 MED ORDER — LISDEXAMFETAMINE DIMESYLATE 70 MG PO CAPS
70.0000 mg | ORAL_CAPSULE | Freq: Every day | ORAL | 0 refills | Status: DC
Start: 2019-12-19 — End: 2020-02-15

## 2019-11-19 NOTE — Progress Notes (Signed)
Psychiatric Initial Adult Assessment   Patient Identification: Darren Mason MRN:  [id number] Date of Evaluation:  11/19/2019 Referral Source: Alma Friendly NP Chief Complaint:  Fatigue, low energy, some depression.  Interview was conducted using videoconferencing application and I verified that I was speaking with the correct person using two identifiers. I discussed the limitations of evaluation and management by telemedicine and  the availability of in person appointments. Patient expressed understanding and agreed to proceed.  Visit Diagnosis:    ICD-10-CM   1. Attention deficit hyperactivity disorder (ADHD), unspecified ADHD type  F90.9   2. Anxiety and depression  F41.9 buPROPion (WELLBUTRIN XL) 300 MG 24 hr tablet   F32.9     History of Present Illness:  Darren Mason is a 39 yo MWM with hx of depression, anxiety, fatigue, problems with concentration, task completion. He has on a combination of escitalopram/bupropion in the past. He developed acute [place] and was admitted to hospital in July 2018. Serotonin syndrome was suspected and escitalopram was discontinued. He re,mins of bupropion 300 mg. For some time he was on duloxetine (for fibromyalgia) but it ws then changed to Lyrica which works better for chronic pain. He has chronic pain issues (back, fibromyalgia) and rakes Pecocet on as needed basis. He is supposed to get electrodes implanted for back stimulation. He also has low testosterone and has been receiving testosterone injections which have been held after he ws found to have elevated LFTs/fatty liver. He has been diagnosed with ADD (no hyperreactivity component) as a young adult and has been on Vyvanse for past 15 years. His prescriber is retiring and he has not been able to get Vyvanse in past two months. He has been feeling tired, distracted more lately. Some mild depression also reappeared. He is in counseling which he finds helpful. Darren Mason denies having hx of feeling/attemptong  suicide, mania, psychosis. He denies abusing alcohol or street/prescription drugs. He has no hx of inpatient psychiatric admissions.  There is no mental health or substance abuse hx in his family.   He is married, has 67 yo son. 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  Medical hx has been reviewed (as well as labs). In addition to above mentiond problems he also has OSA and uses CPAP.  Associated Signs/Symptoms: Depression Symptoms:  depressed mood, difficulty concentrating, loss of energy/fatigue, (Hypo) Manic Symptoms:  None Anxiety Symptoms:  Excessive Worry, Psychotic Symptoms:  None PTSD Symptoms: Negative  Past Psychiatric History: see above.  Previous Psychotropic Medications: Yes   Substance Abuse History in the last 12 months:  No.  Consequences of Substance Abuse: NA  Past Medical History:  Past Medical History:  Diagnosis Date  . ADHD   . Anxiety and depression   . Chronic back pain   . Osteoarthritis   . Serotonin syndrome   . Sleep apnea    CPAP machine   . Testosterone deficiency     Past Surgical History:  Procedure Laterality Date  . APPENDECTOMY  2001  . BACK SURGERY  05/2017    Family Psychiatric History: None.  Family History:  Family History  Problem Relation Age of Onset  . Hypertension Mother   . Hyperlipidemia Mother   . Colon cancer Maternal Grandmother   . Hypertension Father   . Hyperlipidemia Father   . Prostate cancer Neg Hx   . Kidney cancer Neg Hx   . Bladder Cancer  Neg Hx     Social History:   Social History   Socioeconomic History  . Marital status: Married    Spouse name: Not on file  . Number of children: 1  . Years of education: Not on file  . Highest education level: Not on file  Occupational History  . Not on file  Tobacco Use  . Smoking status: Never Smoker  . Smokeless tobacco: Current User    Types:  Chew  Substance and Sexual Activity  . Alcohol use: Yes    Comment: occasionally  . Drug use: No  . Sexual activity: Yes    Comment: wife gats injection  Other Topics Concern  . Not on file  Social History Narrative   On disability from [employer].   Social Determinants of Health   Financial Resource Strain:   . Difficulty of Paying Living Expenses:   Food Insecurity:   . Worried About Programme researcher, broadcasting/film/video in the Last Year:   . Barista in the Last Year:   Transportation Needs:   . Freight forwarder (Medical):   Marland Kitchen Lack of Transportation (Non-Medical):   Physical Activity:   . Days of Exercise per Week:   . Minutes of Exercise per Session:   Stress:   . Feeling of Stress :   Social Connections:   . Frequency of Communication with Friends and Family:   . Frequency of Social Gatherings with Friends and Family:   . Attends Religious Services:   . Active Member of Clubs or Organizations:   . Attends Banker Meetings:   Marland Kitchen Marital Status:     Allergies:  No Known Allergies  Metabolic Disorder Labs: No results found for: HGBA1C, MPG Lab Results  Component Value Date   PROLACTIN 13.9 10/24/2019   Lab Results  Component Value Date   TRIG 266 (H) 01/19/2017   Lab Results  Component Value Date   TSH 0.668 06/16/2019    Therapeutic Level Labs: No results found for: LITHIUM No results found for: CBMZ No results found for: VALPROATE  Current Medications: Current Outpatient Medications  Medication Sig Dispense Refill  . [START ON 02/03/2020] buPROPion (WELLBUTRIN XL) 300 MG 24 hr tablet Take 1 tablet (300 mg total) by mouth daily. For anxiety and depression. 90 tablet 0  . carisoprodol (SOMA) 350 MG tablet Take 350 mg by mouth 3 (three) times daily as needed.     . carvedilol (COREG) 12.5 MG tablet Take 1 tablet (12.5 mg total) by mouth 2 (two) times daily. 180 tablet 2  . diclofenac (VOLTAREN) 75 MG EC tablet Take 75 mg by mouth 2  (two) times daily.     Marland Kitchen lisdexamfetamine (VYVANSE) 70 MG capsule Take 70 mg by mouth daily.    Marland Kitchen lisdexamfetamine (VYVANSE) 70 MG capsule Take 1 capsule (70 mg total) by mouth daily before breakfast. 30 capsule 0  . [START ON 12/19/2019] lisdexamfetamine (VYVANSE) 70 MG capsule Take 1 capsule (70 mg total) by mouth daily before breakfast. 30 capsule 0  . [START ON 01/18/2020] lisdexamfetamine (VYVANSE) 70 MG capsule Take 1 capsule (70 mg total) by mouth daily before breakfast. 30 capsule 0  . losartan (COZAAR) 100 MG tablet Take 1 tablet (100 mg total) by mouth daily. For blood pressure. 90 tablet 3  . oxybutynin (DITROPAN-XL) 10 MG 24 hr tablet Take 1 tablet (10 mg total) by mouth daily. 90 tablet 0  . oxyCODONE-acetaminophen (PERCOCET) 10-325 MG tablet Take 1  tablet by mouth 4 (four) times daily as needed for pain.    . pregabalin (LYRICA) 150 MG capsule Take 150 mg by mouth 2 (two) times daily.    Marland Kitchen PREVIDENT 5000 DRY MOUTH 1.1 % GEL dental gel USE THREE TIMES A DAY AFTER MEALS. DO NOT RINSE.    SLS FREE WILL NOT FOAM.    . sildenafil (VIAGRA) 100 MG tablet Take 1 tablet (100 mg total) by mouth daily as needed for erectile dysfunction. Take two hours prior to intercourse on an empty stomach 30 tablet 3  . testosterone cypionate (DEPOTESTOSTERONE CYPIONATE) 200 MG/ML injection Inject 200 mg into the muscle See admin instructions. Inject 200 mg IM every 10 days     No current facility-administered medications for this visit.    Psychiatric Specialty Exam: Review of Systems  Constitutional: Positive for fatigue.  Musculoskeletal: Positive for back pain and myalgias.  Psychiatric/Behavioral: Positive for decreased concentration. The patient is nervous/anxious.   All other systems reviewed and are negative.   There were no vitals taken for this visit.There is no height or weight on file to calculate BMI.  General Appearance: Casual and Fairly Groomed  Eye Contact:  Good  Speech:  Clear and  Coherent and Normal Rate  Volume:  Normal  Mood:  Some depression and anxiety.  Affect:  Constricted  Thought Process:  Goal Directed and Linear  Orientation:  Full (Time, Place, and Person)  Thought Content:  Logical  Suicidal Thoughts:  No  Homicidal Thoughts:  No  Memory:  Immediate;   Good Recent;   Good Remote;   Good  Judgement:  Good  Insight:  Good  Psychomotor Activity:  Normal  Concentration:  Concentration: Fair  Recall:  Good  Fund of Knowledge:Good  Language: Good  Akathisia:  Negative  Handed:  Right  AIMS (if indicated):  not done  Assets:  Communication Skills Desire for Improvement Financial Resources/Insurance Housing Resilience Social Support  ADL's:  Intact  Cognition: WNL  Sleep:  Fair   Assessment and Plan: 39 yo MWM with hx of depression, anxiety, fatigue, problems with concentration, task completion. He has on a combination of escitalopram/bupropion in the past. 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 Serotonin syndrome was suspected and escitalopram was discontinued. He re,mins of bupropion 300 mg. For some time he was on duloxetine (for fibromyalgia) but it ws then changed to Lyrica which works better for chronic pain. He has chronic pain issues (back, fibromyalgia) and rakes Pecocet on as needed basis. He is supposed to get electrodes implanted for back stimulation. He also has low testosterone and has been receiving testosterone injections which have been held after he ws found to have elevated LFTs/fatty liver. He has been diagnosed with ADD (no hyperreactivity component) as a young adult and has been on Vyvanse for past 15 years. His prescriber is retiring and he has not been able to get Vyvanse in past two months. He has been feeling tired, distracted more lately. Some mild depression also reappeared. He is in counseling which he finds helpful. Darren Mason denies having hx of feeling/attemptong suicide, mania, psychosis. He denies  abusing alcohol or street/prescription drugs. He has no hx of inpatient psychiatric admissions.  Dx: Adult ADD; Depression unspecified, Anxiety unspecified  Plan: We will continue bupropion XL 300 mg daily and resume Vyvanse 70 mg daily. I see no psychiatric concerns/contraindications to him having back stimulation e;lectrodes implanted. He needs a letter to such effect mailed to  him so he can present it to his pain management team. Next appointment with me in 3 months or prn. The plan was discussed with patient who had an opportunity to ask questions and these were all answered. I spend 45 minutes in video visit with the patient and devoted approximately 50% of this time to explanation of diagnosis, discussion of treatment options and med education.  Magdalene Patricia, MD 5/15/20219:34 AM

## 2019-11-23 ENCOUNTER — Ambulatory Visit (INDEPENDENT_AMBULATORY_CARE_PROVIDER_SITE_OTHER): Payer: 59 | Admitting: Psychology

## 2019-11-23 DIAGNOSIS — F411 Generalized anxiety disorder: Secondary | ICD-10-CM

## 2019-11-24 ENCOUNTER — Other Ambulatory Visit: Payer: Self-pay

## 2019-11-24 ENCOUNTER — Other Ambulatory Visit (INDEPENDENT_AMBULATORY_CARE_PROVIDER_SITE_OTHER): Payer: Managed Care, Other (non HMO)

## 2019-11-24 DIAGNOSIS — R7989 Other specified abnormal findings of blood chemistry: Secondary | ICD-10-CM

## 2019-11-25 LAB — HEPATITIS PANEL, ACUTE
Hep A IgM: NONREACTIVE
Hep B C IgM: NONREACTIVE
Hepatitis B Surface Ag: NONREACTIVE
Hepatitis C Ab: NONREACTIVE
SIGNAL TO CUT-OFF: 0.01 (ref <1.00)

## 2019-11-25 LAB — HEPATITIS B SURFACE ANTIBODY, QUANTITATIVE: Hep B S AB Quant (Post): <5 m[IU]/mL — ABNORMAL LOW (ref >=10)

## 2019-11-29 ENCOUNTER — Ambulatory Visit: Payer: Managed Care, Other (non HMO) | Admitting: Primary Care

## 2019-12-01 ENCOUNTER — Encounter (HOSPITAL_COMMUNITY): Payer: Self-pay | Admitting: *Deleted

## 2019-12-07 ENCOUNTER — Encounter (HOSPITAL_COMMUNITY): Payer: Self-pay

## 2019-12-08 ENCOUNTER — Ambulatory Visit (INDEPENDENT_AMBULATORY_CARE_PROVIDER_SITE_OTHER): Payer: 59 | Admitting: Psychology

## 2019-12-08 DIAGNOSIS — F411 Generalized anxiety disorder: Secondary | ICD-10-CM

## 2019-12-13 ENCOUNTER — Telehealth: Payer: 59 | Admitting: Psychiatry

## 2019-12-14 ENCOUNTER — Other Ambulatory Visit: Payer: Self-pay

## 2019-12-14 ENCOUNTER — Ambulatory Visit (INDEPENDENT_AMBULATORY_CARE_PROVIDER_SITE_OTHER): Payer: Managed Care, Other (non HMO) | Admitting: Gastroenterology

## 2019-12-14 ENCOUNTER — Encounter: Payer: Self-pay | Admitting: Gastroenterology

## 2019-12-14 VITALS — BP 132/102 | HR 160 | Temp 98.2°F | Ht 71.5 in | Wt 232.0 lb

## 2019-12-14 DIAGNOSIS — R748 Abnormal levels of other serum enzymes: Secondary | ICD-10-CM

## 2019-12-14 NOTE — Progress Notes (Signed)
Gastroenterology Consultation  Referring Provider:     Doreene Nest, NP Primary Care Physician:  Doreene Nest, NP Primary Gastroenterologist:  Dr. Servando Snare     Reason for Consultation:     Abnormal liver enzymes        HPI:   Darren Mason is a 39 y.o. y/o male referred for consultation & management of abnormal liver enzymes by Dr. Chestine Spore, Keane Scrape, NP.  This patient comes in today with abnormal liver enzymes.  The patient had liver enzymes checked in July 2018 twice within 4 days.  One of the labs were normal wall the following lab was abnormal.  The patient cannot recall what had happened around that time and why it would have been checked 4 days apart.  Subsequent to that the patient has had a jump in his liver enzymes in April of this year.  The patient denies any alcohol abuse and states that he very intermittently takes anti-inflammatories but does consume Tylenol with his pain medication.  The patient has chronic back pain and he reports it to be exacerbated by a failed surgery.  The patient had an ultrasound of the right upper quadrant that showed:  IMPRESSION: Suspect a degree of underlying hepatic steatosis. No focal liver lesions evident. It must be cautioned that the sensitivity of ultrasound for detection of focal liver lesions is somewhat diminished in this circumstance.  Study otherwise unremarkable.  The trending of the patient's labs since 2018 have showed:  Component     Latest Ref Rng & Units 01/19/2017 01/23/2017 10/24/2019  Total Bilirubin     0.0 - 1.2 mg/dL 0.5 0.4 0.3  BILIRUBIN, DIRECT     0.00 - 0.40 mg/dL   7.49  Alkaline Phosphatase     39 - 117 IU/L 52 61 106  AST     0 - 40 IU/L 30 33 44 (H)  ALT     0 - 44 IU/L 56 71 (H) 101 (H)   He reports that he has been trying to lose weight but overall has gained weight since 2018.  He states that it is a combination of his wife's good cooking and his not having as much exercise as he used to.  The  patient's main concern is that he needs to go back on testosterone and his urologist and primary care provider are hesitant to restart his testosterone with his liver enzymes being abnormal.  Past Medical History:  Diagnosis Date  . ADHD   . Anxiety and depression   . Chronic back pain   . Osteoarthritis   . Serotonin syndrome   . Sleep apnea    CPAP machine   . Testosterone deficiency     Past Surgical History:  Procedure Laterality Date  . APPENDECTOMY  2001  . BACK SURGERY  05/2017    Prior to Admission medications   Medication Sig Start Date End Date Taking? Authorizing Provider  buPROPion (WELLBUTRIN XL) 300 MG 24 hr tablet Take 1 tablet (300 mg total) by mouth daily. For anxiety and depression. 02/03/20 05/03/20  Pucilowski, Roosvelt Maser, MD  carisoprodol (SOMA) 350 MG tablet Take 350 mg by mouth 3 (three) times daily as needed.  05/21/16   [provider]  carvedilol (COREG) 12.5 MG tablet Take 1 tablet (12.5 mg total) by mouth 2 (two) times daily. 08/22/19   Debbe Odea, MD  diclofenac (VOLTAREN) 75 MG EC tablet Take 75 mg by mouth 2 (two) times daily.  05/03/16  [provider]  lisdexamfetamine (VYVANSE) 70 MG capsule Take 70 mg by mouth daily.    [provider]  lisdexamfetamine (VYVANSE) 70 MG capsule Take 1 capsule (70 mg total) by mouth daily before breakfast. 11/19/19 12/19/19  Pucilowski, Roosvelt Maser, MD  lisdexamfetamine (VYVANSE) 70 MG capsule Take 1 capsule (70 mg total) by mouth daily before breakfast. 12/19/19 01/18/20  Pucilowski, Roosvelt Maser, MD  lisdexamfetamine (VYVANSE) 70 MG capsule Take 1 capsule (70 mg total) by mouth daily before breakfast. 01/18/20 02/17/20  Pucilowski, Roosvelt Maser, MD  losartan (COZAAR) 100 MG tablet Take 1 tablet (100 mg total) by mouth daily. For blood pressure. 11/04/19   Doreene Nest, NP  oxybutynin (DITROPAN-XL) 10 MG 24 hr tablet Take 1 tablet (10 mg total) by mouth daily. 07/19/19   McGowan, Wellington Hampshire, PA-C    oxyCODONE-acetaminophen (PERCOCET) 10-325 MG tablet Take 1 tablet by mouth 4 (four) times daily as needed for pain.    [provider]  pregabalin (LYRICA) 150 MG capsule Take 150 mg by mouth 2 (two) times daily.    [provider]  PREVIDENT 5000 DRY MOUTH 1.1 % GEL dental gel USE THREE TIMES A DAY AFTER MEALS. DO NOT RINSE.    SLS FREE WILL NOT FOAM. 09/21/19   [provider]  sildenafil (VIAGRA) 100 MG tablet Take 1 tablet (100 mg total) by mouth daily as needed for erectile dysfunction. Take two hours prior to intercourse on an empty stomach 07/19/19   Marvel Plan, Carollee Herter A, PA-C  testosterone cypionate (DEPOTESTOSTERONE CYPIONATE) 200 MG/ML injection Inject 200 mg into the muscle See admin instructions. Inject 200 mg IM every 10 days 03/24/16   [provider]    Family History  Problem Relation Age of Onset  . Hypertension Mother   . Hyperlipidemia Mother   . Colon cancer Maternal Grandmother   . Hypertension Father   . Hyperlipidemia Father   . Prostate cancer Neg Hx   . Kidney cancer Neg Hx   . Bladder Cancer Neg Hx      Social History   Tobacco Use  . Smoking status: Never Smoker  . Smokeless tobacco: Current User    Types: Chew  Substance Use Topics  . Alcohol use: Yes    Comment: occasionally  . Drug use: No    Allergies as of 12/14/2019  . (No Known Allergies)    Review of Systems:    All systems reviewed and negative except where noted in HPI.   Physical Exam:  There were no vitals taken for this visit. No LMP for male patient. General:   Alert,  Well-developed, well-nourished, pleasant and cooperative in NAD Head:  Normocephalic and atraumatic. Eyes:  Sclera clear, no icterus.   Conjunctiva pink. Ears:  Normal auditory acuity. Neck:  Supple; no masses or thyromegaly. Lungs:  Respirations even and unlabored.  Clear throughout to auscultation.   No wheezes, crackles, or rhonchi. No acute distress. Heart:  Regular rate and  rhythm; no murmurs, clicks, rubs, or gallops. Abdomen:  Normal bowel sounds.  No bruits.  Soft, non-tender and non-distended without masses, hepatosplenomegaly or hernias noted.  No guarding or rebound tenderness.  Negative Carnett sign.   Rectal:  Deferred.  Pulses:  Normal pulses noted. Extremities:  No clubbing or edema.  No cyanosis. Neurologic:  Alert and oriented x3;  grossly normal neurologically. Skin:  Intact without significant lesions or rashes.  No jaundice. Lymph Nodes:  No significant cervical adenopathy. Psych:  Alert and cooperative.  Normal mood and affect.  Imaging Studies: No results found.  Assessment and Plan:   Ferlando Lia is a 39 y.o. y/o male who comes in today with a finding of abnormal liver enzymes.  The patient has been found to be immune to hepatitis B but his hepatitis A antibody has not been checked.  The patient's ultrasound showed fatty liver without any focal masses or signs of cirrhosis.  The patient will have his liver enzymes rechecked and other possible cause of his abnormal liver enzymes checked.  The patient will be contacted with the results of the labs and informed of any further work-up he may need.  The patient has been explained the plan agrees with it.    Lucilla Lame, MD. Marval Regal    Note: This dictation was prepared with Dragon dictation along with smaller phrase technology. Any transcriptional errors that result from this process are unintentional.

## 2019-12-15 ENCOUNTER — Other Ambulatory Visit: Payer: Self-pay

## 2019-12-28 ENCOUNTER — Ambulatory Visit (INDEPENDENT_AMBULATORY_CARE_PROVIDER_SITE_OTHER): Payer: 59 | Admitting: Psychology

## 2019-12-28 DIAGNOSIS — F411 Generalized anxiety disorder: Secondary | ICD-10-CM | POA: Diagnosis not present

## 2020-01-05 ENCOUNTER — Other Ambulatory Visit: Payer: Self-pay | Admitting: Urology

## 2020-01-16 ENCOUNTER — Other Ambulatory Visit: Payer: Self-pay | Admitting: Primary Care

## 2020-01-16 DIAGNOSIS — F419 Anxiety disorder, unspecified: Secondary | ICD-10-CM

## 2020-01-24 ENCOUNTER — Telehealth (HOSPITAL_COMMUNITY): Payer: Self-pay | Admitting: *Deleted

## 2020-01-24 NOTE — Telephone Encounter (Signed)
Received fax from [facility] approving VyVanse 70mg  from 01/24/20 through 01/23/21. 01/25/21. Pt aware.

## 2020-01-25 ENCOUNTER — Ambulatory Visit (INDEPENDENT_AMBULATORY_CARE_PROVIDER_SITE_OTHER): Payer: 59 | Admitting: Psychology

## 2020-01-25 DIAGNOSIS — F411 Generalized anxiety disorder: Secondary | ICD-10-CM | POA: Diagnosis not present

## 2020-01-26 LAB — IRON AND TIBC
Iron Saturation: 22 % (ref 15–55)
Iron: 93 ug/dL (ref 38–169)
Total Iron Binding Capacity: 432 ug/dL (ref 250–450)
UIBC: 339 ug/dL (ref 111–343)

## 2020-01-26 LAB — HEPATITIS A ANTIBODY, TOTAL: hep A Total Ab: NEGATIVE

## 2020-01-26 LAB — HEPATIC FUNCTION PANEL
ALT: 66 IU/L — ABNORMAL HIGH (ref 0–44)
AST: 28 IU/L (ref 0–40)
Albumin: 5.3 g/dL — ABNORMAL HIGH (ref 4.0–5.0)
Alkaline Phosphatase: 92 IU/L (ref 48–121)
Bilirubin Total: 0.4 mg/dL (ref 0.0–1.2)
Bilirubin, Direct: 0.15 mg/dL (ref 0.00–0.40)
Total Protein: 8.2 g/dL (ref 6.0–8.5)

## 2020-01-26 LAB — FERRITIN: Ferritin: 81 ng/mL (ref 30–400)

## 2020-01-26 LAB — ALPHA-1-ANTITRYPSIN: A-1 Antitrypsin: 126 mg/dL (ref 95–164)

## 2020-01-26 LAB — CERULOPLASMIN: Ceruloplasmin: 15 mg/dL — ABNORMAL LOW (ref 16.0–31.0)

## 2020-01-26 LAB — MITOCHONDRIAL ANTIBODIES: Mitochondrial Ab: <20 Units (ref 0.0–20.0)

## 2020-01-26 LAB — ANTI-SMOOTH MUSCLE ANTIBODY, IGG: Smooth Muscle Ab: 7 Units (ref 0–19)

## 2020-01-26 LAB — ANA: Anti Nuclear Antibody (ANA): NEGATIVE

## 2020-02-09 ENCOUNTER — Ambulatory Visit (INDEPENDENT_AMBULATORY_CARE_PROVIDER_SITE_OTHER): Payer: 59 | Admitting: Psychology

## 2020-02-09 DIAGNOSIS — F411 Generalized anxiety disorder: Secondary | ICD-10-CM

## 2020-02-13 ENCOUNTER — Telehealth: Payer: Self-pay

## 2020-02-13 NOTE — Telephone Encounter (Signed)
-----   Message from Midge Minium, MD sent at 02/13/2020 12:19 PM EDT ----- The patient know that I am sorry for getting back to him so late but I was out of the office.  As for his liver enzymes his liver enzymes have improved and the other blood test did not show any worrisome features as a cause of his liver enzyme abnormality.  It is likely that the fatty liver is the cause and he should continue to try to lose weight.

## 2020-02-13 NOTE — Telephone Encounter (Signed)
Pt notified of results via mychart.  

## 2020-02-15 ENCOUNTER — Telehealth (INDEPENDENT_AMBULATORY_CARE_PROVIDER_SITE_OTHER): Payer: 59 | Admitting: Psychiatry

## 2020-02-15 ENCOUNTER — Other Ambulatory Visit: Payer: Self-pay

## 2020-02-15 DIAGNOSIS — F900 Attention-deficit hyperactivity disorder, predominantly inattentive type: Secondary | ICD-10-CM

## 2020-02-15 DIAGNOSIS — F329 Major depressive disorder, single episode, unspecified: Secondary | ICD-10-CM | POA: Diagnosis not present

## 2020-02-15 DIAGNOSIS — F419 Anxiety disorder, unspecified: Secondary | ICD-10-CM

## 2020-02-15 DIAGNOSIS — F32A Depression, unspecified: Secondary | ICD-10-CM

## 2020-02-15 MED ORDER — BUPROPION HCL ER (XL) 300 MG PO TB24: ORAL_TABLET | ORAL | 0 refills | Status: DC

## 2020-02-15 MED ORDER — LISDEXAMFETAMINE DIMESYLATE 70 MG PO CAPS
70.0000 mg | ORAL_CAPSULE | Freq: Every day | ORAL | 0 refills | Status: DC
Start: 2020-03-19 — End: 2020-02-21

## 2020-02-15 MED ORDER — LISDEXAMFETAMINE DIMESYLATE 70 MG PO CAPS
70.0000 mg | ORAL_CAPSULE | Freq: Every day | ORAL | 0 refills | Status: DC
Start: 2020-04-18 — End: 2020-02-21

## 2020-02-15 MED ORDER — LISDEXAMFETAMINE DIMESYLATE 70 MG PO CAPS
70.0000 mg | ORAL_CAPSULE | Freq: Every day | ORAL | 0 refills | Status: DC
Start: 2020-02-17 — End: 2020-05-16

## 2020-02-15 NOTE — Progress Notes (Signed)
BH MD/PA/NP OP Progress Note  02/15/2020 3:11 PM Darren Mason  MRN:  [id number] Interview was conducted using videoconferencing application and I verified that I was speaking with the correct person using two identifiers. I discussed the limitations of evaluation and management by telemedicine and  the availability of in person appointments. Patient expressed understanding and agreed to proceed. Patient location - home; physician - home office.  Chief Complaint: "I am doing well".  HPI: 39 yo MWM with hx of depression, anxiety, fatigue, problems with concentration, task completion. He has on a combination of escitalopram/bupropion in the past. 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 Serotonin syndrome was suspected and escitalopram was discontinued. He re,mins of bupropion 300 mg. For some time he was on duloxetine (for fibromyalgia) but it ws then changed to Lyrica which works better for chronic pain. He has chronic pain issues (back, fibromyalgia) and rakes Pecocet on as needed basis. He is supposed to get electrodes implanted for back stimulation. He also has low testosterone and has been receiving testosterone injections which have been held after he ws found to have elevated LFTs/fatty liver. He has been diagnosed with ADD (no hyperreactivity component) as a young adult and has been on Vyvanse for past 15 years. His prescriber is retiring and he has not been able to get Vyvanse in past two months. He has been feeling tired, distracted more lately. Some mild depression also reappeared. He is in counseling which he finds helpful. Tremon denies having hx of feeling/attemptong suicide, mania, psychosis. He denies abusing alcohol or street/prescription drugs. We have continued Wellbutrin and resumed Vyvanse 70 mg daily. He is less tired, focusing improved. His LFTs were rechecked last month and they considerably improved as compared to values in April.  Visit Diagnosis:     ICD-10-CM   1. Attention deficit hyperactivity disorder (ADHD), predominantly inattentive type  F90.0   2. Anxiety and depression  F41.9 buPROPion (WELLBUTRIN XL) 300 MG 24 hr tablet   F32.9     Past Psychiatric History: Please see intake H&P.  Past Medical History:  Past Medical History:  Diagnosis Date  . ADHD   . Anxiety and depression   . Chronic back pain   . Osteoarthritis   . Serotonin syndrome   . Sleep apnea    CPAP machine   . Testosterone deficiency     Past Surgical History:  Procedure Laterality Date  . APPENDECTOMY  2001  . BACK SURGERY  05/2017    Family Psychiatric History: None.  Family History:  Family History  Problem Relation Age of Onset  . Hypertension Mother   . Hyperlipidemia Mother   . Colon cancer Maternal Grandmother   . Hypertension Father   . Hyperlipidemia Father   . Prostate cancer Neg Hx   . Kidney cancer Neg Hx   . Bladder Cancer Neg Hx     Social History:  Social History   Socioeconomic History  . Marital status: Married    Spouse name: Not on file  . Number of children: 1  . Years of education: Not on file  . Highest education level: Not on file  Occupational History  . Not on file  Tobacco Use  . Smoking status: Never Smoker  . Smokeless tobacco: Current User    Types: Chew  Vaping Use  . Vaping Use: Never used  Substance and Sexual Activity  . Alcohol use: Yes    Comment: occasionally  . Drug use: No  .  Sexual activity: Yes    Comment: wife gats injection  Other Topics Concern  . Not on file  Social History Narrative   On disability from [employer].   Social Determinants of Health   Financial Resource Strain:   . Difficulty of Paying Living Expenses:   Food Insecurity:   . Worried About Programme researcher, broadcasting/film/video in the Last Year:   . Barista in the Last Year:   Transportation Needs:   . Freight forwarder (Medical):   Marland Kitchen Lack of Transportation (Non-Medical):   Physical Activity:    . Days of Exercise per Week:   . Minutes of Exercise per Session:   Stress:   . Feeling of Stress :   Social Connections:   . Frequency of Communication with Friends and Family:   . Frequency of Social Gatherings with Friends and Family:   . Attends Religious Services:   . Active Member of Clubs or Organizations:   . Attends Banker Meetings:   Marland Kitchen Marital Status:     Allergies: No Known Allergies  Metabolic Disorder Labs: No results found for: HGBA1C, MPG Lab Results  Component Value Date   PROLACTIN 13.9 10/24/2019   Lab Results  Component Value Date   TRIG 266 (H) 01/19/2017   Lab Results  Component Value Date   TSH 0.668 06/16/2019    Therapeutic Level Labs: No results found for: LITHIUM No results found for: VALPROATE No components found for:  CBMZ  Current Medications: Current Outpatient Medications  Medication Sig Dispense Refill  . [START ON 04/16/2020] buPROPion (WELLBUTRIN XL) 300 MG 24 hr tablet TAKE 1 TABLET(300 MG) BY MOUTH DAILY FOR ANXIETY OR DEPRESSION 90 tablet 0  . carisoprodol (SOMA) 350 MG tablet Take 350 mg by mouth 3 (three) times daily as needed.     . carvedilol (COREG) 12.5 MG tablet Take 1 tablet (12.5 mg total) by mouth 2 (two) times daily. 180 tablet 2  . diclofenac (VOLTAREN) 75 MG EC tablet Take 75 mg by mouth 2 (two) times daily.     Melene Muller ON 02/17/2020] lisdexamfetamine (VYVANSE) 70 MG capsule Take 1 capsule (70 mg total) by mouth daily before breakfast. 30 capsule 0  . [START ON 03/19/2020] lisdexamfetamine (VYVANSE) 70 MG capsule Take 1 capsule (70 mg total) by mouth daily before breakfast. 30 capsule 0  . [START ON 04/18/2020] lisdexamfetamine (VYVANSE) 70 MG capsule Take 1 capsule (70 mg total) by mouth daily before breakfast. 30 capsule 0  . losartan (COZAAR) 100 MG tablet Take 1 tablet (100 mg total) by mouth daily. For blood pressure. 90 tablet 3  . oxybutynin (DITROPAN-XL) 10 MG 24 hr tablet Take 1 tablet (10 mg  total) by mouth daily. 90 tablet 0  . oxyCODONE-acetaminophen (PERCOCET) 10-325 MG tablet Take 1 tablet by mouth 4 (four) times daily as needed for pain.    . pregabalin (LYRICA) 150 MG capsule Take 150 mg by mouth 2 (two) times daily.    Marland Kitchen PREVIDENT 5000 DRY MOUTH 1.1 % GEL dental gel USE THREE TIMES A DAY AFTER MEALS. DO NOT RINSE.    SLS FREE WILL NOT FOAM.    . sildenafil (VIAGRA) 100 MG tablet TAKE ONE TABLET BY MOUTH DAILY AS NEEDED FOR ERECTILE DYSFUNCTION - TAKE 2 HOURS PRIOR TO INTERCOURSE ON AN EMPTY STOMACH 30 tablet 0  . testosterone cypionate (DEPOTESTOSTERONE CYPIONATE) 200 MG/ML injection Inject 200 mg into the muscle See admin instructions. Inject 200 mg  IM every 10 days     No current facility-administered medications for this visit.     Psychiatric Specialty Exam: Review of Systems  Musculoskeletal: Positive for back pain.  All other systems reviewed and are negative.   There were no vitals taken for this visit.There is no height or weight on file to calculate BMI.  General Appearance: Casual and Well Groomed  Eye Contact:  Good  Speech:  Clear and Coherent and Normal Rate  Volume:  Normal  Mood:  Euthymic  Affect:  Full Range  Thought Process:  Goal Directed  Orientation:  Full (Time, Place, and Person)  Thought Content: Logical   Suicidal Thoughts:  No  Homicidal Thoughts:  No  Memory:  Immediate;   Good Recent;   Good Remote;   Good  Judgement:  Good  Insight:  Good  Psychomotor Activity:  Normal  Concentration:  Concentration: Good  Recall:  Good  Fund of Knowledge: Good  Language: Good  Akathisia:  Negative  Handed:  Right  AIMS (if indicated): not done  Assets:  Communication Skills Desire for Improvement Financial Resources/Insurance Housing Social Support Talents/Skills  ADL's:  Intact  Cognition: WNL  Sleep:  Good    Assessment and Plan: 39 yo MWM with hx of depression, anxiety, fatigue, problems with concentration, task completion. He  has on a combination of escitalopram/bupropion in the past. 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 Serotonin syndrome was suspected and escitalopram was discontinued. He re,mins of bupropion 300 mg. For some time he was on duloxetine (for fibromyalgia) but it ws then changed to Lyrica which works better for chronic pain. He has chronic pain issues (back, fibromyalgia) and rakes Pecocet on as needed basis. He is supposed to get electrodes implanted for back stimulation. He also has low testosterone and has been receiving testosterone injections which have been held after he ws found to have elevated LFTs/fatty liver. He has been diagnosed with ADD (no hyperreactivity component) as a young adult and has been on Vyvanse for past 15 years. His prescriber is retiring and he has not been able to get Vyvanse in past two months. He has been feeling tired, distracted more lately. Some mild depression also reappeared. He is in counseling which he finds helpful. Anterrio denies having hx of feeling/attemptong suicide, mania, psychosis. He denies abusing alcohol or street/prescription drugs. We have continued Wellbutrin and resumed Vyvanse 70 mg daily. He is less tired, focusing improved. His LFTs were rechecked last month and they considerably improved as compared to values in April.  Dx: Adult ADD; Depression unspecified, Anxiety unspecified  Plan: We will continue bupropion XL 300 mg daily and Vyvanse 70 mg daily. Next appointment with me in 3 months or prn. The plan was discussed with patient who had an opportunity to ask questions and these were all answered. I spend 15 minutes in video visit with the patient    Magdalene Patricia, MD 02/15/2020, 3:11 PM

## 2020-02-17 ENCOUNTER — Ambulatory Visit: Payer: Managed Care, Other (non HMO) | Admitting: Cardiology

## 2020-02-17 ENCOUNTER — Ambulatory Visit: Payer: Managed Care, Other (non HMO) | Admitting: Family

## 2020-02-17 ENCOUNTER — Encounter: Payer: Self-pay | Admitting: Family

## 2020-02-17 ENCOUNTER — Other Ambulatory Visit: Payer: Self-pay

## 2020-02-17 VITALS — BP 136/108 | HR 86 | Ht 72.0 in | Wt 231.0 lb

## 2020-02-17 DIAGNOSIS — R000 Tachycardia, unspecified: Secondary | ICD-10-CM

## 2020-02-17 DIAGNOSIS — I10 Essential (primary) hypertension: Secondary | ICD-10-CM

## 2020-02-17 MED ORDER — CARVEDILOL 12.5 MG PO TABS: 12.5000 mg | ORAL_TABLET | Freq: Two times a day (BID) | ORAL | 2 refills | Status: DC

## 2020-02-17 NOTE — Patient Instructions (Addendum)
Medication Instructions:  Your physician has recommended you make the following change in your medication:   START Carvedilol 12.5mg  one tablet twice daily  *If you need a refill on your cardiac medications before your next appointment, please call your pharmacy*  Lab Work: None today.  Testing/Procedures: Your EKG today shows normal sinus rhythm - this is a great result!  Follow-Up: At [facility], you and your health needs are our priority.  As part of our continuing mission to provide you with exceptional heart care, we have created designated Provider Care Teams.  These Care Teams include your primary Cardiologist (physician) and Advanced Practice Providers (APPs -  Physician Assistants and Nurse Practitioners) who all work together to provide you with the care you need, when you need it.  We recommend signing up for the patient portal called "MyChart".  Sign up information is provided on this After Visit Summary.  MyChart is used to connect with patients for Virtual Visits (Telemedicine).  Patients are able to view lab/test results, encounter notes, upcoming appointments, etc.  Non-urgent messages can be sent to your provider as well.   To learn more about what you can do with MyChart, go to [email or web address].    Your next appointment:   6 month(s)  The format for your next appointment:   In Person  Provider:   You may see Debbe Odea, MD or one of the following Advanced Practice Providers on your designated Care Team:    Nicolasa Ducking, NP  Eula Listen, PA-C  Marisue Ivan, PA-C  Gillian Shields, NP  Other Instructions  Our goal is for your blood pressure to be consistently <130/80  Tips to Measure your Blood Pressure Correctly  To determine whether you have hypertension, a medical professional will take a blood pressure reading. How you prepare for the test, the position of your arm, and other factors can change a blood pressure reading by 10% or  more. That could be enough to hide high blood pressure, start you on a drug you don't really need, or lead your doctor to incorrectly adjust your medications.  National and international guidelines offer specific instructions for measuring blood pressure. If a doctor, nurse, or medical assistant isn't doing it right, don't hesitate to ask him or her to get with the guidelines.  Here's what you can do to ensure a correct reading: . Don't drink a caffeinated beverage or smoke during the 30 minutes before the test. . Sit quietly for five minutes before the test begins. . During the measurement, sit in a chair with your feet on the floor and your arm supported so your elbow is at about heart level. . The inflatable part of the cuff should completely cover at least 80% of your upper arm, and the cuff should be placed on bare skin, not over a shirt. . Don't talk during the measurement. . Have your blood pressure measured twice, with a brief break in between. If the readings are different by 5 points or more, have it done a third time.  There are times to break these rules. If you sometimes feel lightheaded when getting out of bed in the morning or when you stand after sitting, you should have your blood pressure checked while seated and then while standing to see if it falls from one position to the next.  Blood pressure categories  Blood pressure category SYSTOLIC (upper number)  DIASTOLIC (lower number)  Normal Less than 120 mm Hg and Less than 80 mm Hg  Elevated 120-129 mm Hg and Less than 80 mm Hg  High blood pressure: Stage 1 hypertension 130-139 mm Hg or 80-89 mm Hg  High blood pressure: Stage 2 hypertension 140 mm Hg or higher or 90 mm Hg or higher  Hypertensive crisis (consult your doctor immediately) Higher than 180 mm Hg and/or Higher than 120 mm Hg  Source: American Heart Association and American Stroke Association. For more on getting your blood pressure under control, buy Controlling  Your Blood Pressure, a Special Health Report from [facility].   Blood Pressure Log   Date   Time  Blood Pressure  Position  Example: Nov 1 9 AM 124/78 sitting

## 2020-02-17 NOTE — Progress Notes (Signed)
.    Office Visit    Patient Name: Darren Mason Date of Encounter: 02/17/2020  Primary Care Provider:  Doreene Nest, NP Primary Cardiologist:  Debbe Odea, MD Electrophysiologist:  None   Chief Complaint    Darren Mason is a 39 y.o. male with a hx of ADHD, anxiety, testosterone deficiency, HTN presents today for follow-up of HTN  Past Medical History    Past Medical History:  Diagnosis Date  . ADHD   . Anxiety and depression   . Chronic back pain   . Osteoarthritis   . Serotonin syndrome   . Sleep apnea    CPAP machine   . Testosterone deficiency    Past Surgical History:  Procedure Laterality Date  . APPENDECTOMY  2001  . BACK SURGERY  05/2017    Allergies  No Known Allergies  History of Present Illness    Darren Mason is a 39 y.o. male with a hx of ADHD, anxiety, testosterone deficiency, HTN last seen 08/2019 by Dr Azucena Cecil.  Seen in ED 06/2019 for hypertension and tachycardia with rates of 128 bpm, blood pressure 159/110.  Vyvanse and testosterone were stopped and losartan 100 mg daily was started.  Subsequently saw urology and blood pressure at that visit was 149/90 with heart rate 132.  When seen in clinic by Dr. Azucena Cecil BP 140/92 and heart rate 86.  Elevated blood pressure was thought to be due to distress/pain, anxiety with testosterone and Vyvanse contributory.  He was recommended to increase Coreg to 12.5 mg twice daily at that visit.  Presents today for follow-up.  Tells me he has not been taking the Coreg as it was sent to the wrong pharmacy and he was unable to facilitate getting it sent to the proper pharmacy.  He checks his blood pressure at home and frequently with readings often 130-145/90-100.  No formal exercise routine.  He does enjoy swimming at the pool with his son.  Reports no shortness of breath nor dyspnea on exertion. Reports no chest pain, pressure, or tightness. No edema, orthopnea, PND. Reports no palpitations.    EKGs/Labs/Other Studies Reviewed:   The following studies were reviewed today:   EKG:  EKG is ordered today.  The ekg ordered today demonstrates normal sinus rhythm 86 bpm with no acute ST/T wave changes.  Recent Labs: 06/16/2019: BUN 14; Creatinine, Ser 1.35; Hemoglobin 15.8; Platelets 209; Potassium 3.7; Sodium 137; TSH 0.668 01/25/2020: ALT 66  Recent Lipid Panel    Component Value Date/Time   TRIG 266 (H) 01/19/2017 1912    Home Medications   Current Meds  Medication Sig  . [START ON 04/16/2020] buPROPion (WELLBUTRIN XL) 300 MG 24 hr tablet TAKE 1 TABLET(300 MG) BY MOUTH DAILY FOR ANXIETY OR DEPRESSION  . carisoprodol (SOMA) 350 MG tablet Take 350 mg by mouth 3 (three) times daily as needed.   . carvedilol (COREG) 12.5 MG tablet Take 1 tablet (12.5 mg total) by mouth 2 (two) times daily.  . diclofenac (VOLTAREN) 75 MG EC tablet Take 75 mg by mouth 2 (two) times daily.   Marland Kitchen lisdexamfetamine (VYVANSE) 70 MG capsule Take 1 capsule (70 mg total) by mouth daily before breakfast.  . [START ON 03/19/2020] lisdexamfetamine (VYVANSE) 70 MG capsule Take 1 capsule (70 mg total) by mouth daily before breakfast.  . [START ON 04/18/2020] lisdexamfetamine (VYVANSE) 70 MG capsule Take 1 capsule (70 mg total) by mouth daily before breakfast.  . losartan (COZAAR) 100 MG tablet Take 1 tablet (100 mg total)  by mouth daily. For blood pressure.  Marland Kitchen oxybutynin (DITROPAN-XL) 10 MG 24 hr tablet Take 1 tablet (10 mg total) by mouth daily.  Marland Kitchen oxyCODONE-acetaminophen (PERCOCET) 10-325 MG tablet Take 1 tablet by mouth 4 (four) times daily as needed for pain.  . pregabalin (LYRICA) 150 MG capsule Take 150 mg by mouth 2 (two) times daily.  Marland Kitchen PREVIDENT 5000 DRY MOUTH 1.1 % GEL dental gel USE THREE TIMES A DAY AFTER MEALS. DO NOT RINSE.    SLS FREE WILL NOT FOAM.  . sildenafil (VIAGRA) 100 MG tablet TAKE ONE TABLET BY MOUTH DAILY AS NEEDED FOR ERECTILE DYSFUNCTION - TAKE 2 HOURS PRIOR TO INTERCOURSE ON AN EMPTY  STOMACH  . testosterone cypionate (DEPOTESTOSTERONE CYPIONATE) 200 MG/ML injection Inject 200 mg into the muscle See admin instructions. Inject 200 mg IM every 10 days  . [DISCONTINUED] carvedilol (COREG) 12.5 MG tablet Take 1 tablet (12.5 mg total) by mouth 2 (two) times daily.      Review of Systems      Review of Systems  Constitutional: Negative for chills, fever and malaise/fatigue.  Cardiovascular: Negative for chest pain, dyspnea on exertion, leg swelling, near-syncope, orthopnea, palpitations and syncope.  Respiratory: Negative for cough, shortness of breath and wheezing.   Gastrointestinal: Negative for nausea and vomiting.  Neurological: Negative for dizziness, light-headedness and weakness.   All other systems reviewed and are otherwise negative except as noted above.  Physical Exam    VS:  BP (!) 136/108 (BP Location: Left Arm, Patient Position: Sitting, Cuff Size: Normal)   Pulse 86   Ht 6' (1.829 m)   Wt 231 lb (104.8 kg)   SpO2 98%   BMI 31.33 kg/m  , BMI Body mass index is 31.33 kg/m. GEN: Well nourished, well developed, in no acute distress. HEENT: normal. Neck: Supple, no JVD, carotid bruits, or masses. Cardiac: RRR, no murmurs, rubs, or gallops. No clubbing, cyanosis, edema.  Radials/DP/PT 2+ and equal bilaterally.  Respiratory:  Respirations regular and unlabored, clear to auscultation bilaterally. GI: Soft, nontender, nondistended, BS + x 4. MS: No deformity or atrophy. Skin: Warm and dry, no rash. Neuro:  Strength and sensation are intact. Psych: Normal affect. Assessment & Plan    1. HTN -BP elevated today.  He will continue losartan 100 mg daily per his primary care provider.  His diastolic numbers in particular are elevated and we will plan to add carvedilol 12.5 mg twice daily.  This was previously recommended but he did not pick up due to difficulties with the pharmacy.  2. Tachycardia -heart rate well controlled today.  Reports no palpitations.  No  indication for ZIO monitor at this time.  Disposition: Follow up in 6 month(s) with Dr. Azucena Cecil   Alver Sorrow, NP 02/17/2020, 4:45 PM

## 2020-02-20 NOTE — Progress Notes (Signed)
10/18/2019 08:30 AM  Darren Mason [birth year] [id number]  Referring provider: Doreene Nest, NP [address] [place],  [place] [address]  Chief Complaint  Patient presents with  . testosterone deficiency    HPI: Darren Mason is a 39 y.o. male presenting today to discuss restarting his testosterone therapy.  He first started taking testosterone replacement injections approx. 10 yrs ago by Dr. Isaac Bliss. He states that extensive blood work was drawn from pcp in order to test for and confirm low testosterone. He states that used to get an injection once q 10, now it has decreased to once q 12. He first started on injections, then changed to using a testoerone gel a few years ago, then went back to using injections. The pt can't recall if he started taking pain medication before or after starting testosterone replacement therapy, so he is unsure if the pain medication is what caused his low levels. He believes that it was around the same time. He states that the levels could stem from stress, lack of sleep, but he doesn't know certainly what is causing low levels of testosterone.  His testosterone deficiency was managed by his primary care physician, but she has since retired.  He also has had elevated liver function tests in the past and has been evaluated through GI which have determined it is due to hepatic steatosis and he is encouraged to lose weight.  He is now wanting to restart his testosterone therapy as he is feeling fatigued.    Patient denies any modifying or aggravating factors.  Patient denies any gross hematuria, dysuria or suprapubic/flank pain.  Patient denies any fevers, chills, nausea or vomiting.    PMH: Past Medical History:  Diagnosis Date  . ADHD   . Anxiety and depression   . Chronic back pain   . Osteoarthritis   . Serotonin syndrome   . Sleep apnea    CPAP machine   . Testosterone deficiency     Surgical History: Past Surgical History:  Procedure  Laterality Date  . APPENDECTOMY  2001  . BACK SURGERY  05/2017    Home Medications:  Allergies as of 02/21/2020   No Known Allergies     Medication List       Accurate as of February 21, 2020 11:59 PM. If you have any questions, ask your nurse or doctor.        buPROPion 300 MG 24 hr tablet Commonly known as: WELLBUTRIN XL TAKE 1 TABLET(300 MG) BY MOUTH DAILY FOR ANXIETY OR DEPRESSION Start taking on: April 16, 2020   carisoprodol 350 MG tablet Commonly known as: SOMA Take 350 mg by mouth 3 (three) times daily as needed.   carvedilol 12.5 MG tablet Commonly known as: COREG Take 1 tablet (12.5 mg total) by mouth 2 (two) times daily.   diclofenac 75 MG EC tablet Commonly known as: VOLTAREN Take 75 mg by mouth 2 (two) times daily.   escitalopram 10 MG tablet Commonly known as: LEXAPRO Take 10 mg by mouth daily.   lisdexamfetamine 70 MG capsule Commonly known as: Vyvanse Take 1 capsule (70 mg total) by mouth daily before breakfast. What changed: Another medication with the same name was removed. Continue taking this medication, and follow the directions you see here. Changed by: Michiel Cowboy, PA-C   losartan 100 MG tablet Commonly known as: COZAAR Take 1 tablet (100 mg total) by mouth daily. For blood pressure.   oxybutynin 10 MG 24 hr tablet Commonly known  as: DITROPAN-XL Take 1 tablet (10 mg total) by mouth daily.   oxyCODONE-acetaminophen 10-325 MG tablet Commonly known as: PERCOCET Take 1 tablet by mouth 4 (four) times daily as needed for pain.   pregabalin 150 MG capsule Commonly known as: LYRICA Take 150 mg by mouth 2 (two) times daily.   PreviDent 5000 Dry Mouth 1.1 % Gel dental gel Generic drug: sodium fluoride USE THREE TIMES A DAY AFTER MEALS. DO NOT RINSE.    SLS FREE WILL NOT FOAM.   sildenafil 100 MG tablet Commonly known as: VIAGRA TAKE ONE TABLET BY MOUTH DAILY AS NEEDED FOR ERECTILE DYSFUNCTION - TAKE 2 HOURS PRIOR TO INTERCOURSE ON AN  EMPTY STOMACH   testosterone cypionate 200 MG/ML injection Commonly known as: DEPOTESTOSTERONE CYPIONATE Inject 200 mg into the muscle See admin instructions. Inject 200 mg IM every 10 days       Allergies: No Known Allergies  Family History: Family History  Problem Relation Age of Onset  . Hypertension Mother   . Hyperlipidemia Mother   . Colon cancer Maternal Grandmother   . Hypertension Father   . Hyperlipidemia Father   . Prostate cancer Neg Hx   . Kidney cancer Neg Hx   . Bladder Cancer Neg Hx     Social History:  reports that he has never smoked. His smokeless tobacco use includes chew. He reports current alcohol use. He reports that he does not use drugs.   Physical Exam: BP 111/77   Pulse 78   Wt 225 lb (102.1 kg)   BMI 30.52 kg/m   Constitutional:  Well nourished. Alert and oriented, No acute distress. HEENT: [place] AT, mask in olace.  Trachea midline Cardiovascular: No clubbing, cyanosis, or edema. Respiratory: Normal respiratory effort, no increased work of breathing. Neurologic: Grossly intact, no focal deficits, moving all 4 extremities. Psychiatric: Normal mood and affect.  Laboratory Data: Lab Results  Component Value Date   WBC 7.2 06/16/2019   HGB 16.8 02/21/2020   HCT 48.4 02/21/2020   MCV 85.9 06/16/2019   PLT 209 06/16/2019    Lab Results  Component Value Date   CREATININE 1.35 (H) 06/16/2019    Results for orders placed or performed in visit on 02/21/20  Hemoglobin  Result Value Ref Range   Hemoglobin 16.8 13.0 - 17.7 g/dL  Hematocrit  Result Value Ref Range   Hematocrit 48.4 37.5 - 51.0 %  Testosterone  Result Value Ref Range   Testosterone 61 (L) 264 - 916 ng/dL  I have reviewed the labs.  Assessment & Plan:    1. Testosterone Deficiency We will recheck patient's testosterone level and hemoglobin hematocrit to ensure it is appropriate for him to restart his testosterone therapy If laboratory values returned within acceptable  levels, he will start testosterone cypionate 200 mg/IM, 1 cc every 14 days and he will have a testosterone level drawn 1 week after his fourth injection   Michiel Cowboy, [facility] [address] [place], [place] [address] [phone]  I spent 30 minutes on the day of the encounter to include pre-visit record review, face-to-face time with the patient, and post-visit ordering of tests.

## 2020-02-21 ENCOUNTER — Ambulatory Visit (INDEPENDENT_AMBULATORY_CARE_PROVIDER_SITE_OTHER): Payer: Managed Care, Other (non HMO) | Admitting: Urology

## 2020-02-21 ENCOUNTER — Other Ambulatory Visit: Payer: Self-pay

## 2020-02-21 VITALS — BP 111/77 | HR 78 | Wt 225.0 lb

## 2020-02-21 DIAGNOSIS — E349 Endocrine disorder, unspecified: Secondary | ICD-10-CM

## 2020-02-22 ENCOUNTER — Other Ambulatory Visit: Payer: Self-pay | Admitting: Urology

## 2020-02-22 ENCOUNTER — Encounter: Payer: Self-pay | Admitting: Urology

## 2020-02-22 ENCOUNTER — Telehealth: Payer: Self-pay | Admitting: Family Medicine

## 2020-02-22 LAB — HEMOGLOBIN: Hemoglobin: 16.8 g/dL (ref 13.0–17.7)

## 2020-02-22 LAB — TESTOSTERONE: Testosterone: 61 ng/dL — ABNORMAL LOW (ref 264–916)

## 2020-02-22 LAB — HEMATOCRIT: Hematocrit: 48.4 % (ref 37.5–51.0)

## 2020-02-22 MED ORDER — TESTOSTERONE CYPIONATE 200 MG/ML IM SOLN: 200.0000 mg | INTRAMUSCULAR | 0 refills | Status: DC

## 2020-02-22 NOTE — Telephone Encounter (Signed)
-----   Message from [name], PA-C sent at 02/22/2020  8:42 AM EDT ----- Please let Mr. Bye know that his labs have returned as expected.  His hemoglobin hematocrit are within normal parameters.  His testosterone is below normal at 61.  I have sent the prescription for his testosterone cypionate onto his pharmacy.  He is to start 1 cc every 14 days and return as scheduled for follow-up testosterone level.

## 2020-02-22 NOTE — Telephone Encounter (Signed)
Patient notified and voiced understanding.

## 2020-03-09 ENCOUNTER — Ambulatory Visit (INDEPENDENT_AMBULATORY_CARE_PROVIDER_SITE_OTHER): Payer: 59 | Admitting: Psychology

## 2020-03-09 DIAGNOSIS — F411 Generalized anxiety disorder: Secondary | ICD-10-CM | POA: Diagnosis not present

## 2020-03-28 ENCOUNTER — Ambulatory Visit: Payer: 59 | Admitting: Psychology

## 2020-03-29 ENCOUNTER — Other Ambulatory Visit: Payer: Self-pay | Admitting: Urology

## 2020-03-30 ENCOUNTER — Ambulatory Visit: Payer: 59 | Admitting: Psychology

## 2020-04-11 ENCOUNTER — Other Ambulatory Visit: Payer: Self-pay

## 2020-04-11 ENCOUNTER — Other Ambulatory Visit: Payer: Self-pay | Admitting: Family Medicine

## 2020-04-11 ENCOUNTER — Other Ambulatory Visit: Payer: Managed Care, Other (non HMO)

## 2020-04-11 DIAGNOSIS — E349 Endocrine disorder, unspecified: Secondary | ICD-10-CM

## 2020-04-12 LAB — TESTOSTERONE: Testosterone: 321 ng/dL (ref 264–916)

## 2020-04-17 ENCOUNTER — Telehealth: Payer: Self-pay | Admitting: Family Medicine

## 2020-04-17 DIAGNOSIS — E349 Endocrine disorder, unspecified: Secondary | ICD-10-CM

## 2020-04-17 NOTE — Telephone Encounter (Signed)
-----   Message from Shannon A McGowan, PA-C sent at 04/16/2020 10:33 AM EDT ----- Please let Darren Mason know that his testosterone level has returned within normal limits.  I would like to have his levels repeated in three months (one week after an injection) along with HCT and hgb,   

## 2020-04-17 NOTE — Telephone Encounter (Signed)
-----   Message from [name], PA-C sent at 04/16/2020 10:33 AM EDT ----- Please let Mr. Darren Mason know that his testosterone level has returned within normal limits.  I would like to have his levels repeated in three months (one week after an injection) along with HCT and hgb,

## 2020-04-17 NOTE — Telephone Encounter (Signed)
LMOM and gave results he is to call our office back to schedule labs. Orders are in.

## 2020-04-20 ENCOUNTER — Other Ambulatory Visit: Payer: Self-pay | Admitting: Neurological Surgery

## 2020-04-20 DIAGNOSIS — G894 Chronic pain syndrome: Secondary | ICD-10-CM

## 2020-04-30 ENCOUNTER — Ambulatory Visit (HOSPITAL_COMMUNITY): Admission: RE | Admit: 2020-04-30 | Payer: Managed Care, Other (non HMO) | Source: Ambulatory Visit

## 2020-04-30 ENCOUNTER — Telehealth: Payer: Self-pay | Admitting: Urology

## 2020-04-30 NOTE — Telephone Encounter (Signed)
We left a message for him to schedule a lab appointment in three months which has not been done.  Once his appointment is scheduled, I will send in the refills.

## 2020-04-30 NOTE — Telephone Encounter (Signed)
Pt needs testosterone to be sent to [facility] on [address]. States he picked up his last dose 2.5 to 3 weeks ago. Please advise.

## 2020-05-01 ENCOUNTER — Other Ambulatory Visit: Payer: Self-pay

## 2020-05-01 ENCOUNTER — Other Ambulatory Visit: Payer: Self-pay | Admitting: Urology

## 2020-05-01 ENCOUNTER — Ambulatory Visit (HOSPITAL_COMMUNITY)
Admission: RE | Admit: 2020-05-01 | Discharge: 2020-05-01 | Disposition: A | Discharge status: Home / Self Care | Payer: Managed Care, Other (non HMO) | Source: Ambulatory Visit | Attending: Neurological Surgery | Admitting: Neurological Surgery

## 2020-05-01 DIAGNOSIS — G894 Chronic pain syndrome: Secondary | ICD-10-CM

## 2020-05-01 NOTE — Telephone Encounter (Signed)
Called patient, pt was confused and said he just did labs a few days ago, explained to him that he needed to schedule a lab appt in 45mos.  Scheduled a lab appt in January, pt will make sure to change lab if it isn't 1 week after injection.

## 2020-05-02 ENCOUNTER — Other Ambulatory Visit: Payer: Self-pay | Admitting: Urology

## 2020-05-14 ENCOUNTER — Other Ambulatory Visit: Payer: Self-pay | Admitting: Urology

## 2020-05-16 ENCOUNTER — Telehealth (INDEPENDENT_AMBULATORY_CARE_PROVIDER_SITE_OTHER): Payer: 59 | Admitting: Psychiatry

## 2020-05-16 ENCOUNTER — Other Ambulatory Visit: Payer: Self-pay

## 2020-05-16 DIAGNOSIS — F419 Anxiety disorder, unspecified: Secondary | ICD-10-CM

## 2020-05-16 DIAGNOSIS — F32A Depression, unspecified: Secondary | ICD-10-CM

## 2020-05-16 DIAGNOSIS — F900 Attention-deficit hyperactivity disorder, predominantly inattentive type: Secondary | ICD-10-CM | POA: Diagnosis not present

## 2020-05-16 MED ORDER — LISDEXAMFETAMINE DIMESYLATE 70 MG PO CAPS: 70.0000 mg | ORAL_CAPSULE | Freq: Every day | ORAL | 0 refills | Status: DC

## 2020-05-16 MED ORDER — BUPROPION HCL ER (XL) 300 MG PO TB24: 300.0000 mg | ORAL_TABLET | Freq: Every day | ORAL | 1 refills | Status: DC

## 2020-05-16 NOTE — Progress Notes (Signed)
BH MD/PA/NP OP Progress Note  05/16/2020 3:12 PM Darren Mason  MRN:  [id number] Interview was conducted using videoconferencing application and I verified that I was speaking with the correct person using two identifiers. I discussed the limitations of evaluation and management by telemedicine and  the availability of in person appointments. Patient expressed understanding and agreed to proceed. Patient location - home; physician - home office.  Chief Complaint: None.  HPI: 39 yo MWM with hx of depression, anxiety, fatigue, problems with concentration, task completion. He had been on a combination of escitalopram/bupropion in the past. 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 Serotonin syndrome was suspected and escitalopram was discontinued. He remins on bupropion 300 mg. For some time he was on duloxetine (for fibromyalgia) but it was then changed to Lyrica which works better for chronic pain. He has chronic pain issues (back, fibromyalgia) and takes Pecocet on as needed basis. He has been diagnosed with ADD (no hyperreactivity component) as a young adult and has been on Vyvanse for past 15 years - current dose 70 mg.  It works well and is well tolerated.     Visit Diagnosis:    ICD-10-CM   1. Attention deficit hyperactivity disorder (ADHD), predominantly inattentive type  F90.0   2. Anxiety and depression  F41.9 buPROPion (WELLBUTRIN XL) 300 MG 24 hr tablet   F32.A     Past Psychiatric History: Please see intake H&P.  Past Medical History:  Past Medical History:  Diagnosis Date  . ADHD   . Anxiety and depression   . Chronic back pain   . Osteoarthritis   . Serotonin syndrome   . Sleep apnea    CPAP machine   . Testosterone deficiency     Past Surgical History:  Procedure Laterality Date  . APPENDECTOMY  2001  . BACK SURGERY  05/2017    Family Psychiatric History: None.  Family History:  Family History  Problem Relation Age of Onset  .  Hypertension Mother   . Hyperlipidemia Mother   . Colon cancer Maternal Grandmother   . Hypertension Father   . Hyperlipidemia Father   . Prostate cancer Neg Hx   . Kidney cancer Neg Hx   . Bladder Cancer Neg Hx     Social History:  Social History   Socioeconomic History  . Marital status: Married    Spouse name: Not on file  . Number of children: 1  . Years of education: Not on file  . Highest education level: Not on file  Occupational History  . Not on file  Tobacco Use  . Smoking status: Never Smoker  . Smokeless tobacco: Current User    Types: Chew  Vaping Use  . Vaping Use: Never used  Substance and Sexual Activity  . Alcohol use: Yes    Comment: occasionally  . Drug use: No  . Sexual activity: Yes    Comment: wife gats injection  Other Topics Concern  . Not on file  Social History Narrative   On disability from [employer].   Social Determinants of Health   Financial Resource Strain:   . Difficulty of Paying Living Expenses: Not on file  Food Insecurity:   . Worried About Programme researcher, broadcasting/film/video in the Last Year: Not on file  . Ran Out of Food in the Last Year: Not on file  Transportation Needs:   . Lack of Transportation (Medical): Not on file  . Lack of Transportation (Non-Medical):  Not on file  Physical Activity:   . Days of Exercise per Week: Not on file  . Minutes of Exercise per Session: Not on file  Stress:   . Feeling of Stress : Not on file  Social Connections:   . Frequency of Communication with Friends and Family: Not on file  . Frequency of Social Gatherings with Friends and Family: Not on file  . Attends Religious Services: Not on file  . Active Member of Clubs or Organizations: Not on file  . Attends Banker Meetings: Not on file  . Marital Status: Not on file    Allergies: No Known Allergies  Metabolic Disorder Labs: No results found for: HGBA1C, MPG Lab Results  Component Value Date   PROLACTIN 13.9  10/24/2019   Lab Results  Component Value Date   TRIG 266 (H) 01/19/2017   Lab Results  Component Value Date   TSH 0.668 06/16/2019    Therapeutic Level Labs: No results found for: LITHIUM No results found for: VALPROATE No components found for:  CBMZ  Current Medications: Current Outpatient Medications  Medication Sig Dispense Refill  . testosterone cypionate (DEPOTESTOSTERONE CYPIONATE) 200 MG/ML injection ADMINISTER 1 ML(200 MG) IN THE MUSCLE EVERY 14 DAYS AS DIRECTED 5 mL 0  . [START ON 07/06/2020] buPROPion (WELLBUTRIN XL) 300 MG 24 hr tablet Take 1 tablet (300 mg total) by mouth daily. 90 tablet 1  . carisoprodol (SOMA) 350 MG tablet Take 350 mg by mouth 3 (three) times daily as needed.     . carvedilol (COREG) 12.5 MG tablet Take 1 tablet (12.5 mg total) by mouth 2 (two) times daily. 180 tablet 2  . diclofenac (VOLTAREN) 75 MG EC tablet Take 75 mg by mouth 2 (two) times daily.     Marland Kitchen escitalopram (LEXAPRO) 10 MG tablet Take 10 mg by mouth daily.    Melene Muller ON 05/18/2020] lisdexamfetamine (VYVANSE) 70 MG capsule Take 1 capsule (70 mg total) by mouth daily before breakfast. 30 capsule 0  . [START ON 06/17/2020] lisdexamfetamine (VYVANSE) 70 MG capsule Take 1 capsule (70 mg total) by mouth daily before breakfast. 30 capsule 0  . [START ON 07/17/2020] lisdexamfetamine (VYVANSE) 70 MG capsule Take 1 capsule (70 mg total) by mouth daily before breakfast. 30 capsule 0  . losartan (COZAAR) 100 MG tablet Take 1 tablet (100 mg total) by mouth daily. For blood pressure. 90 tablet 3  . oxybutynin (DITROPAN-XL) 10 MG 24 hr tablet Take 1 tablet (10 mg total) by mouth daily. 90 tablet 0  . oxyCODONE-acetaminophen (PERCOCET) 10-325 MG tablet Take 1 tablet by mouth 4 (four) times daily as needed for pain.    . pregabalin (LYRICA) 150 MG capsule Take 150 mg by mouth 2 (two) times daily.    Marland Kitchen PREVIDENT 5000 DRY MOUTH 1.1 % GEL dental gel USE THREE TIMES A DAY AFTER MEALS. DO NOT RINSE.    SLS FREE  WILL NOT FOAM.    . sildenafil (VIAGRA) 100 MG tablet TAKE ONE TABLET BY MOUTH DAILY AS NEEDED FOR ERECTILE DYSFUNCTION . TAKE TWO HOURS PRIOR TO INTERCOURSE ON AN EMPTY STOMACH 30 tablet 0   No current facility-administered medications for this visit.    Psychiatric Specialty Exam: Review of Systems  Musculoskeletal: Positive for back pain and myalgias.  All other systems reviewed and are negative.   There were no vitals taken for this visit.There is no height or weight on file to calculate BMI.  General Appearance: Casual and Well Groomed  Eye Contact:  Good  Speech:  Clear and Coherent and Normal Rate  Volume:  Normal  Mood:  Euthymic  Affect:  Full Range  Thought Process:  Goal Directed  Orientation:  Full (Time, Place, and Person)  Thought Content: Logical   Suicidal Thoughts:  No  Homicidal Thoughts:  No  Memory:  Immediate;   Good Recent;   Good Remote;   Good  Judgement:  Good  Insight:  Good  Psychomotor Activity:  Normal  Concentration:  Concentration: Good  Recall:  Good  Fund of Knowledge: Good  Language: Good  Akathisia:  Negative  Handed:  Right  AIMS (if indicated): not done  Assets:  Communication Skills Desire for Improvement Financial Resources/Insurance Housing Resilience Social Support  ADL's:  Intact  Cognition: WNL  Sleep:  Good     Assessment and Plan: 39 yo MWM with hx of depression, anxiety, fatigue, problems with concentration, task completion. He had been on a combination of escitalopram/bupropion in the past. 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 Serotonin syndrome was suspected and escitalopram was discontinued. He remins on bupropion 300 mg. For some time he was on duloxetine (for fibromyalgia) but it was then changed to Lyrica which works better for chronic pain. He has chronic pain issues (back, fibromyalgia) and takes Pecocet on as needed basis. He has been diagnosed with ADD (no hyperreactivity component) as  a young adult and has been on Vyvanse for past 15 years - current dose 70 mg.  It works well and is well tolerated.   Dx: Adult ADD; Depression unspecified, Anxiety unspecified  Plan: We will continue bupropion XL 300 mg daily and Vyvanse 70 mg daily. Next appointment with me in 3 months or prn.The plan was discussed with patient who had an opportunity to ask questions and these were all answered. I spend 15 minutes invideo visitwith the patient    Magdalene Patricia, MD 05/16/2020, 3:12 PM

## 2020-06-15 ENCOUNTER — Other Ambulatory Visit: Payer: Self-pay | Admitting: Urology

## 2020-06-24 ENCOUNTER — Other Ambulatory Visit: Payer: Self-pay | Admitting: Urology

## 2020-07-18 ENCOUNTER — Other Ambulatory Visit: Payer: Self-pay | Admitting: Urology

## 2020-07-18 ENCOUNTER — Other Ambulatory Visit: Payer: Managed Care, Other (non HMO)

## 2020-07-18 ENCOUNTER — Other Ambulatory Visit: Payer: Self-pay

## 2020-07-18 DIAGNOSIS — E349 Endocrine disorder, unspecified: Secondary | ICD-10-CM

## 2020-07-19 LAB — TESTOSTERONE: Testosterone: 273 ng/dL (ref 264–916)

## 2020-07-19 LAB — HEMOGLOBIN: Hemoglobin: 16.5 g/dL (ref 13.0–17.7)

## 2020-07-19 LAB — HEMATOCRIT: Hematocrit: 49.8 % (ref 37.5–51.0)

## 2020-07-24 ENCOUNTER — Telehealth: Payer: Self-pay | Admitting: Family Medicine

## 2020-07-24 NOTE — Telephone Encounter (Signed)
Pt returned call and I have scheduled OV with Carollee Herter.

## 2020-07-24 NOTE — Telephone Encounter (Signed)
LMOM for patient to return call.

## 2020-07-24 NOTE — Telephone Encounter (Signed)
-----   Message from [name], PA-C sent at 07/23/2020  9:49 AM EST ----- Please let Darren Mason know that his testosterone is not at therapeutic levels.  He should have an appointment for further discussion.

## 2020-07-25 NOTE — Progress Notes (Signed)
10/18/2019 08:30 AM  Darren Mason [birth year] [id number]  Referring provider: Doreene Nest, NP [address] [place],  [place] [address]  Chief Complaint  Patient presents with  . Follow-up   Urological history 1. Testosterone deficiency - managed with testosterone cypionate 200 mg/mL, 1 cc every 10 days  - testosterone 273 on 07/18/2020 - HCT and hemoglobin are WNL  - has sleep apnea - sleeping with CPAP  2. ED - SHIM 24  - managed with sildenafil 100 mg on demand dosing  3. Urgency - I PSS 10/2 - PVR 3 mL - advised to have cysto, but patient cancelled  HPI: Darren Mason is a 40 y.o. male who presents today to discuss his options for testosterone therapy.  He has not had an injection since 07/07/2020 due to issue with his insurance.   Our office needed his update his insurance coverage which he did today.  So his recent testosterone level was drawn when he was due for his next injection.   He states his urgency has abated after he has eliminated sodas from his diet.  He was recently told by his mother he had meatal stenosis as a child.  He does not have issues with straining to urinate, split urinary stream or a weak urinary stream.    His PVR is 3 mL.  Patient denies any modifying or aggravating factors.  Patient denies any gross hematuria, dysuria or suprapubic/flank pain.  Patient denies any fevers, chills, nausea or vomiting.    IPSS    Row Name 07/26/20 1100         International Prostate Symptom Score   How often have you had the sensation of not emptying your bladder? Less than 1 in 5     How often have you had to urinate less than every two hours? Less than half the time     How often have you found you stopped and started again several times when you urinated? Less than 1 in 5 times     How often have you found it difficult to postpone urination? More than half the time     How often have you had a weak urinary stream? Less than 1 in 5 times     How  often have you had to strain to start urination? Not at All     How many times did you typically get up at night to urinate? 1 Time     Total IPSS Score 10           Quality of Life due to urinary symptoms   If you were to spend the rest of your life with your urinary condition just the way it is now how would you feel about that? Mostly Satisfied            Score:  1-7 Mild 8-19 Moderate 20-35 Severe  Patient still having spontaneous erections.  He denies any pain or curvature with erections.  Having good erections with sildenafil 100 mg.     SHIM    Row Name 07/26/20 1112         SHIM: Over the last 6 months:   How do you rate your confidence that you could get and keep an erection? High     When you had erections with sexual stimulation, how often were your erections hard enough for penetration (entering your partner)? Almost Always or Always     During sexual intercourse, how often were you able to  maintain your erection after you had penetrated (entered) your partner? Almost Always or Always     During sexual intercourse, how difficult was it to maintain your erection to completion of intercourse? Not Difficult     When you attempted sexual intercourse, how often was it satisfactory for you? Almost Always or Always           SHIM Total Score   SHIM 24            PMH: Past Medical History:  Diagnosis Date  . ADHD   . Anxiety and depression   . Chronic back pain   . Osteoarthritis   . Serotonin syndrome   . Sleep apnea    CPAP machine   . Testosterone deficiency     Surgical History: Past Surgical History:  Procedure Laterality Date  . APPENDECTOMY  2001  . BACK SURGERY  05/2017    Home Medications:  Allergies as of 07/26/2020      Reactions   Nickel Rash      Medication List       Accurate as of July 26, 2020 11:59 PM. If you have any questions, ask your nurse or doctor.        STOP taking these medications   oxybutynin 10 MG 24 hr  tablet Commonly known as: DITROPAN-XL Stopped by: Jeffrey Graefe, PA-C     TAKE these medications   buPROPion 300 MG 24 hr tablet Commonly known as: WELLBUTRIN XL Take 1 tablet (300 mg total) by mouth daily.   carisoprodol 350 MG tablet Commonly known as: SOMA Take 350 mg by mouth 3 (three) times daily as needed.   carvedilol 12.5 MG tablet Commonly known as: COREG Take 1 tablet (12.5 mg total) by mouth 2 (two) times daily.   diclofenac 75 MG EC tablet Commonly known as: VOLTAREN Take 75 mg by mouth 2 (two) times daily.   escitalopram 10 MG tablet Commonly known as: LEXAPRO Take 10 mg by mouth daily.   lisdexamfetamine 70 MG capsule Commonly known as: Vyvanse Take 1 capsule (70 mg total) by mouth daily before breakfast. What changed: Another medication with the same name was removed. Continue taking this medication, and follow the directions you see here. Changed by: Michiel Cowboy, PA-C   losartan 100 MG tablet Commonly known as: COZAAR Take 1 tablet (100 mg total) by mouth daily. For blood pressure.   oxyCODONE-acetaminophen 10-325 MG tablet Commonly known as: PERCOCET Take 1 tablet by mouth 4 (four) times daily as needed for pain.   pregabalin 150 MG capsule Commonly known as: LYRICA Take 150 mg by mouth 2 (two) times daily.   PreviDent 5000 Dry Mouth 1.1 % Gel dental gel Generic drug: sodium fluoride USE THREE TIMES A DAY AFTER MEALS. DO NOT RINSE.    SLS FREE WILL NOT FOAM.   sildenafil 100 MG tablet Commonly known as: VIAGRA TAKE ONE TABLET BY MOUTH DAILY AS NEEDED FOR ERECTILE DYSFUNCTION.  TAKE TWO HOURS PRIOR TO INTERCOURSE ON AN EMPTY STOMACH   testosterone cypionate 200 MG/ML injection Commonly known as: DEPOTESTOSTERONE CYPIONATE ADMINISTER 1 ML(200 MG) IN THE MUSCLE EVERY 14 DAYS AS DIRECTED       Allergies:  Allergies  Allergen Reactions  . Nickel Rash    Family History: Family History  Problem Relation Age of Onset  . Hypertension  Mother   . Hyperlipidemia Mother   . Colon cancer Maternal Grandmother   . Hypertension Father   . Hyperlipidemia Father   .  Prostate cancer Neg Hx   . Kidney cancer Neg Hx   . Bladder Cancer Neg Hx     Social History:  reports that he has never smoked. His smokeless tobacco use includes chew. He reports current alcohol use. He reports that he does not use drugs.  For pertinent review of systems please refer to history of present illness  Physical Exam: BP (!) 142/87   Pulse 97   Ht 6\' 1"  (1.854 m)   Wt 230 lb (104.3 kg)   BMI 30.34 kg/m   Constitutional:  Well nourished. Alert and oriented, No acute distress. HEENT: [place] AT, mask in place.  Trachea midline Cardiovascular: No clubbing, cyanosis, or edema. Respiratory: Normal respiratory effort, no increased work of breathing. Neurologic: Grossly intact, no focal deficits, moving all 4 extremities. Psychiatric: Normal mood and affect.  Laboratory Data: Component     Latest Ref Rng & Units 07/18/2020  Testosterone     264 - 916 ng/dL 09/15/2020   Component     Latest Ref Rng & Units 07/18/2020          HCT     37.5 - 51.0 % 49.8   Component     Latest Ref Rng & Units 07/18/2020          Hemoglobin     13.0 - 17.7 g/dL 09/15/2020  I have reviewed the labs.   Pertinent Imaging Results for orders placed or performed in visit on 07/26/20  BLADDER SCAN AMB NON-IMAGING  Result Value Ref Range   Scan Result 3 ml    Assessment & Plan:    1. Testosterone Deficiency Testosterone levels are subtherapeutic due to increased time between injection and blood draw Will need to get his testosterone cypionate prior approval, so he can get started back on the injection  He will call 07/28/20 once he is able to acquire his testosterone medication, so that we can set up follow-up blood work  2. ED - refill given of sildenafil 100 mg  3. Urgency - resolved with eliminating soda from his diet   [place], [facility] [address] [place], [place] [phone]

## 2020-07-26 ENCOUNTER — Other Ambulatory Visit: Payer: Self-pay

## 2020-07-26 ENCOUNTER — Encounter: Payer: Self-pay | Admitting: Urology

## 2020-07-26 ENCOUNTER — Ambulatory Visit (INDEPENDENT_AMBULATORY_CARE_PROVIDER_SITE_OTHER): Payer: Managed Care, Other (non HMO) | Admitting: Urology

## 2020-07-26 VITALS — BP 142/87 | HR 97 | Ht 73.0 in | Wt 230.0 lb

## 2020-07-26 DIAGNOSIS — R3915 Urgency of urination: Secondary | ICD-10-CM

## 2020-07-26 DIAGNOSIS — E349 Endocrine disorder, unspecified: Secondary | ICD-10-CM | POA: Diagnosis not present

## 2020-07-26 DIAGNOSIS — N529 Male erectile dysfunction, unspecified: Secondary | ICD-10-CM | POA: Diagnosis not present

## 2020-07-26 LAB — BLADDER SCAN AMB NON-IMAGING: Scan Result: 3

## 2020-07-26 MED ORDER — SILDENAFIL CITRATE 100 MG PO TABS: ORAL_TABLET | ORAL | 3 refills | Status: DC

## 2020-07-26 NOTE — Telephone Encounter (Addendum)
LMOM for patient to contact his insurance company. I have attempted to do a PA for Testosterone and cover my meds sends an alert that the phone number and zip code are incorrect.

## 2020-07-26 NOTE — Telephone Encounter (Signed)
Patient returned call, he call the insurance and corrected the phone number and Zip code.

## 2020-07-27 NOTE — Telephone Encounter (Signed)
Attempted PA again and it is still giving the same error message. Will continue to try to get approval.

## 2020-07-30 NOTE — Telephone Encounter (Signed)
Spoke to patient and informed him that the same error message is still coming up on the attempt to do a prior authorization. Patient states he is going to contact the pharmacy to be sure they have the correct information as well.

## 2020-08-16 ENCOUNTER — Other Ambulatory Visit: Payer: Self-pay

## 2020-08-16 ENCOUNTER — Telehealth (INDEPENDENT_AMBULATORY_CARE_PROVIDER_SITE_OTHER): Payer: 59 | Admitting: Psychiatry

## 2020-08-16 DIAGNOSIS — F419 Anxiety disorder, unspecified: Secondary | ICD-10-CM | POA: Diagnosis not present

## 2020-08-16 DIAGNOSIS — F32A Depression, unspecified: Secondary | ICD-10-CM | POA: Diagnosis not present

## 2020-08-16 DIAGNOSIS — F900 Attention-deficit hyperactivity disorder, predominantly inattentive type: Secondary | ICD-10-CM

## 2020-08-16 MED ORDER — LISDEXAMFETAMINE DIMESYLATE 70 MG PO CAPS: 70.0000 mg | ORAL_CAPSULE | Freq: Every day | ORAL | 0 refills | Status: DC

## 2020-08-16 NOTE — Progress Notes (Signed)
BH MD/PA/NP OP Progress Note  08/16/2020 3:07 PM Darren Mason  MRN:  [id number] Interview was conducted by phone and I verified that I was speaking with the correct person using two identifiers. I discussed the limitations of evaluation and management by telemedicine and  the availability of in person appointments. Patient expressed understanding and agreed to proceed. Participants in the visit: patient (location - home); physician (location - home office).  Chief Complaint: None.  HPI: 40 yo MWM with hx of depression, anxiety, fatigue, problems with concentration, task completion. He had been on a combination of escitalopram/bupropion in the past. 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 Serotonin syndrome was suspected and escitalopram was discontinued. He remins on bupropion 300 mg. For some time he was on duloxetine (for fibromyalgia) but it was then changed to Lyrica which works better for chroic pain. He has chronic pain issues (back, fibromyalgia) and takes Pecocet on as needed bnasis. He has been diagnosed with ADD (no hyperreactivity component) as a young adult and has been on Vyvanse for past 15 years - current dose 70 mg. It works well and is well tolerated.     Visit Diagnosis:    ICD-10-CM   1. Attention deficit hyperactivity disorder (ADHD), predominantly inattentive type  F90.0   2. Anxiety and depression  F41.9    F32.A     Past Psychiatric History: Please see intake H&P.  Past Medical History:  Past Medical History:  Diagnosis Date  . ADHD   . Anxiety and depression   . Chronic back pain   . Osteoarthritis   . Serotonin syndrome   . Sleep apnea    CPAP machine   . Testosterone deficiency     Past Surgical History:  Procedure Laterality Date  . APPENDECTOMY  2001  . BACK SURGERY  05/2017    Family Psychiatric History: None.  Family History:  Family History  Problem Relation Age of Onset  . Hypertension Mother   . Hyperlipidemia  Mother   . Colon cancer Maternal Grandmother   . Hypertension Father   . Hyperlipidemia Father   . Prostate cancer Neg Hx   . Kidney cancer Neg Hx   . Bladder Cancer Neg Hx     Social History:  Social History   Socioeconomic History  . Marital status: Married    Spouse name: Not on file  . Number of children: 1  . Years of education: Not on file  . Highest education level: Not on file  Occupational History  . Not on file  Tobacco Use  . Smoking status: Never Smoker  . Smokeless tobacco: Current User    Types: Chew  Vaping Use  . Vaping Use: Never used  Substance and Sexual Activity  . Alcohol use: Yes    Comment: occasionally  . Drug use: No  . Sexual activity: Yes    Comment: wife gats injection  Other Topics Concern  . Not on file  Social History Narrative   On disability from [employer].   Social Determinants of Health   Financial Resource Strain: Not on file  Food Insecurity: Not on file  Transportation Needs: Not on file  Physical Activity: Not on file  Stress: Not on file  Social Connections: Not on file    Allergies:  Allergies  Allergen Reactions  . Nickel Rash    Metabolic Disorder Labs: No results found for: HGBA1C, MPG Lab Results  Component Value Date   PROLACTIN  13.9 10/24/2019   Lab Results  Component Value Date   TRIG 266 (H) 01/19/2017   Lab Results  Component Value Date   TSH 0.668 06/16/2019    Therapeutic Level Labs: No results found for: LITHIUM No results found for: VALPROATE No components found for:  CBMZ  Current Medications: Current Outpatient Medications  Medication Sig Dispense Refill  . lisdexamfetamine (VYVANSE) 70 MG capsule Take 1 capsule (70 mg total) by mouth daily before breakfast. 30 capsule 0  . [START ON 09/15/2020] lisdexamfetamine (VYVANSE) 70 MG capsule Take 1 capsule (70 mg total) by mouth daily before breakfast. 30 capsule 0  . [START ON 10/15/2020] lisdexamfetamine (VYVANSE) 70 MG  capsule Take 1 capsule (70 mg total) by mouth daily before breakfast. 30 capsule 0  . buPROPion (WELLBUTRIN XL) 300 MG 24 hr tablet Take 1 tablet (300 mg total) by mouth daily. 90 tablet 1  . carisoprodol (SOMA) 350 MG tablet Take 350 mg by mouth 3 (three) times daily as needed.     . carvedilol (COREG) 12.5 MG tablet Take 1 tablet (12.5 mg total) by mouth 2 (two) times daily. 180 tablet 2  . diclofenac (VOLTAREN) 75 MG EC tablet Take 75 mg by mouth 2 (two) times daily.     Marland Kitchen escitalopram (LEXAPRO) 10 MG tablet Take 10 mg by mouth daily.    Marland Kitchen losartan (COZAAR) 100 MG tablet Take 1 tablet (100 mg total) by mouth daily. For blood pressure. 90 tablet 3  . oxyCODONE-acetaminophen (PERCOCET) 10-325 MG tablet Take 1 tablet by mouth 4 (four) times daily as needed for pain.    . pregabalin (LYRICA) 150 MG capsule Take 150 mg by mouth 2 (two) times daily.    Marland Kitchen PREVIDENT 5000 DRY MOUTH 1.1 % GEL dental gel USE THREE TIMES A DAY AFTER MEALS. DO NOT RINSE.    SLS FREE WILL NOT FOAM.    . sildenafil (VIAGRA) 100 MG tablet TAKE ONE TABLET BY MOUTH DAILY AS NEEDED FOR ERECTILE DYSFUNCTION.  TAKE TWO HOURS PRIOR TO INTERCOURSE ON AN EMPTY STOMACH 30 tablet 3  . testosterone cypionate (DEPOTESTOSTERONE CYPIONATE) 200 MG/ML injection ADMINISTER 1 ML(200 MG) IN THE MUSCLE EVERY 14 DAYS AS DIRECTED 5 mL 0   No current facility-administered medications for this visit.      Psychiatric Specialty Exam: Review of Systems  Musculoskeletal: Positive for myalgias.  All other systems reviewed and are negative.   There were no vitals taken for this visit.There is no height or weight on file to calculate BMI.  General Appearance: NA  Eye Contact:  NA  Speech:  Clear and Coherent and Normal Rate  Volume:  Normal  Mood:  Euthymic  Affect:  NA  Thought Process:  Goal Directed and Linear  Orientation:  Full (Time, Place, and Person)  Thought Content: Logical   Suicidal Thoughts:  No  Homicidal Thoughts:  No   Memory:  Immediate;   Good Recent;   Good Remote;   Good  Judgement:  Good  Insight:  Good  Psychomotor Activity:  NA  Concentration:  Concentration: Good  Recall:  Good  Fund of Knowledge: Good  Language: Good  Akathisia:  Negative  Handed:  Right  AIMS (if indicated): not done  Assets:  Communication Skills Desire for Improvement Financial Resources/Insurance Housing Social Support Vocational/Educational  ADL's:  Intact  Cognition: WNL  Sleep:  Good    Assessment and Plan: 40 yo MWM with hx of depression, anxiety, fatigue, problems with concentration, task completion.  He had been on a combination of escitalopram/bupropion in the past. 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 Serotonin syndrome was suspected and escitalopram was discontinued. He remins on bupropion 300 mg. For some time he was on duloxetine (for fibromyalgia) but it was then changed to Lyrica which works better for chroic pain. He has chronic pain issues (back, fibromyalgia) and takes Pecocet on as needed bnasis. He has been diagnosed with ADD (no hyperreactivity component) as a young adult and has been on Vyvanse for past 15 years - current dose 70 mg. It works well and is well tolerated.   Dx: Adult ADD; Depression unspecified, Anxiety unspecified  Plan: We will continue bupropion XL 300 mg daily andVyvanse 70 mg daily. Next appointment with me in 3 months with a new provider.The plan was discussed with patient who had an opportunity to ask questions and these were all answered. I spend15 minutes inphone consultationwith the patient.    Magdalene Patricia, MD 08/16/2020, 3:07 PM

## 2020-08-20 ENCOUNTER — Ambulatory Visit (INDEPENDENT_AMBULATORY_CARE_PROVIDER_SITE_OTHER): Payer: Managed Care, Other (non HMO) | Admitting: Cardiology

## 2020-08-20 ENCOUNTER — Other Ambulatory Visit: Payer: Self-pay

## 2020-08-20 ENCOUNTER — Encounter: Payer: Self-pay | Admitting: Cardiology

## 2020-08-20 VITALS — BP 150/106 | HR 86 | Ht 73.0 in | Wt 246.4 lb

## 2020-08-20 DIAGNOSIS — I10 Essential (primary) hypertension: Secondary | ICD-10-CM | POA: Diagnosis not present

## 2020-08-20 DIAGNOSIS — R000 Tachycardia, unspecified: Secondary | ICD-10-CM

## 2020-08-20 NOTE — Progress Notes (Signed)
Cardiology Office Note:    Date:  08/20/2020   ID:  Darren Mason, DOB 02/07/1981, MRN [id number]  PCP:  Doreene Nest, NP  Cardiologist:  Debbe Odea, MD  Electrophysiologist:  None   Referring MD: Doreene Nest, NP   Chief Complaint  Patient presents with  . 6 month follow up     "doing well." Medications reviewed by the patient verbally.     History of Present Illness:    Darren Mason is a 40 y.o. male with a hx of hypertension, ADHD, anxiety, testosterone deficiency who presents for follow-up.  He was last seen due to elevated blood pressures. Patient takes Vyvanse for ADHD and testosterone cypionate for low testosterone.    Started on Coreg 12.5 mg twice daily, losartan was continued after last visit.  Symptoms of tachycardia with heart rates of up to 128 bpm seem to have improved with beta-blocker dosage.  His blood pressures at home have also been much better.  Systolic is usually around 130s.  He was little late for his clinic appointment today, and was rushing which might explain his elevated blood pressures today.  He has a nerve stimulation device please in his lower back to help with pain issues.      Past Medical History:  Diagnosis Date  . ADHD   . Anxiety and depression   . Chronic back pain   . Osteoarthritis   . Serotonin syndrome   . Sleep apnea    CPAP machine   . Testosterone deficiency     Past Surgical History:  Procedure Laterality Date  . APPENDECTOMY  2001  . BACK SURGERY  05/2017    Current Medications: Current Meds  Medication Sig  . buPROPion (WELLBUTRIN XL) 300 MG 24 hr tablet Take 1 tablet (300 mg total) by mouth daily.  . carisoprodol (SOMA) 350 MG tablet Take 350 mg by mouth 3 (three) times daily as needed.   . carvedilol (COREG) 12.5 MG tablet Take 1 tablet (12.5 mg total) by mouth 2 (two) times daily.  . diclofenac (VOLTAREN) 75 MG EC tablet Take 75 mg by mouth 2 (two) times daily.   Marland Kitchen lisdexamfetamine (VYVANSE) 70  MG capsule Take 1 capsule (70 mg total) by mouth daily before breakfast.  . losartan (COZAAR) 100 MG tablet Take 1 tablet (100 mg total) by mouth daily. For blood pressure.  Marland Kitchen oxyCODONE-acetaminophen (PERCOCET) 10-325 MG tablet Take 1 tablet by mouth 4 (four) times daily as needed for pain.  . pregabalin (LYRICA) 150 MG capsule Take 150 mg by mouth 2 (two) times daily.  Marland Kitchen PREVIDENT 5000 DRY MOUTH 1.1 % GEL dental gel USE THREE TIMES A DAY AFTER MEALS. DO NOT RINSE.    SLS FREE WILL NOT FOAM.  . sildenafil (VIAGRA) 100 MG tablet TAKE ONE TABLET BY MOUTH DAILY AS NEEDED FOR ERECTILE DYSFUNCTION.  TAKE TWO HOURS PRIOR TO INTERCOURSE ON AN EMPTY STOMACH  . testosterone cypionate (DEPOTESTOSTERONE CYPIONATE) 200 MG/ML injection ADMINISTER 1 ML(200 MG) IN THE MUSCLE EVERY 14 DAYS AS DIRECTED     Allergies:   Nickel   Social History   Socioeconomic History  . Marital status: Married    Spouse name: Not on file  . Number of children: 1  . Years of education: Not on file  . Highest education level: Not on file  Occupational History  . Not on file  Tobacco Use  . Smoking status: Never Smoker  . Smokeless tobacco: Current User    Types:  Chew  Vaping Use  . Vaping Use: Never used  Substance and Sexual Activity  . Alcohol use: Yes    Comment: occasionally  . Drug use: No  . Sexual activity: Yes    Comment: wife gats injection  Other Topics Concern  . Not on file  Social History Narrative   On disability from [employer].   Social Determinants of Health   Financial Resource Strain: Not on file  Food Insecurity: Not on file  Transportation Needs: Not on file  Physical Activity: Not on file  Stress: Not on file  Social Connections: Not on file     Family History: The patient's family history includes Colon cancer in his maternal grandmother; Hyperlipidemia in his father and mother; Hypertension in his father and mother. There is no history of Prostate cancer, Kidney  cancer, or Bladder Cancer.  ROS:   Please see the history of present illness.     All other systems reviewed and are negative.  EKGs/Labs/Other Studies Reviewed:    The following studies were reviewed today:   EKG:  EKG is  ordered today.  The ekg ordered today demonstrates normal sinus rhythm, normal ECG.  Normal ECG.  Recent Labs: 01/25/2020: ALT 66 07/18/2020: Hemoglobin 16.5  Recent Lipid Panel    Component Value Date/Time   TRIG 266 (H) 01/19/2017 1912    Physical Exam:    VS:  BP (!) 150/106 (BP Location: Left Arm, Patient Position: Sitting, Cuff Size: Large)   Pulse 86   Ht 6\' 1"  (1.854 m)   Wt 246 lb 6 oz (111.8 kg)   SpO2 98%   BMI 32.51 kg/m     Wt Readings from Last 3 Encounters:  08/20/20 246 lb 6 oz (111.8 kg)  07/26/20 230 lb (104.3 kg)  02/21/20 225 lb (102.1 kg)     GEN:  Well nourished, well developed, mild distress HEENT: Normal NECK: No JVD; No carotid bruits LYMPHATICS: No lymphadenopathy CARDIAC: RRR, no murmurs, rubs, gallops RESPIRATORY:  Clear to auscultation without rales, wheezing or rhonchi  ABDOMEN: Soft, non-tender, non-distended MUSCULOSKELETAL:  No edema; No deformity  SKIN: Warm and dry NEUROLOGIC:  Alert and oriented x 3 PSYCHIATRIC:  Normal affect   ASSESSMENT:    1. Primary hypertension   2. Tachycardia    PLAN:    In order of problems listed above:  1. Hypertension, blood pressure elevated, likely from rushing to clinic today.  Blood pressures are controlled at home with systolics in the 130s..  Repeat blood pressures improved with systolic around 142.  Initial systolic blood pressure on presentation was 150.  Symptoms of anxiety, chronic back pain, medications including testosterone, Vyvanse contributing to elevated blood pressures.  Continue losartan 100 mg daily, Coreg 12.5 mg twice daily.  Consider titrating Coreg if BP stays elevated. 2. Tachycardia, improved with beta-blocker.  Dose beta-blocker/Coreg as  above.  Follow-up in 6 months   This note was generated in part or whole with voice recognition software. Voice recognition is usually quite accurate but there are transcription errors that can and very often do occur. I apologize for any typographical errors that were not detected and corrected.  Medication Adjustments/Labs and Tests Ordered: Current medicines are reviewed at length with the patient today.  Concerns regarding medicines are outlined above.  Orders Placed This Encounter  Procedures  . EKG 12-Lead   No orders of the defined types were placed in this encounter.   Patient Instructions  Medication Instructions:  Your  physician recommends that you continue on your current medications as directed. Please refer to the Current Medication list given to you today.  *If you need a refill on your cardiac medications before your next appointment, please call your pharmacy*   Lab Work: None ordered If you have labs (blood work) drawn today and your tests are completely normal, you will receive your results only by: Marland Kitchen MyChart Message (if you have MyChart) OR . A paper copy in the mail If you have any lab test that is abnormal or we need to change your treatment, we will call you to review the results.   Testing/Procedures: None ordered   Follow-Up: At [facility], you and your health needs are our priority.  As part of our continuing mission to provide you with exceptional heart care, we have created designated Provider Care Teams.  These Care Teams include your primary Cardiologist (physician) and Advanced Practice Providers (APPs -  Physician Assistants and Nurse Practitioners) who all work together to provide you with the care you need, when you need it.  We recommend signing up for the patient portal called "MyChart".  Sign up information is provided on this After Visit Summary.  MyChart is used to connect with patients for Virtual Visits (Telemedicine).  Patients are  able to view lab/test results, encounter notes, upcoming appointments, etc.  Non-urgent messages can be sent to your provider as well.   To learn more about what you can do with MyChart, go to [email or web address].    Your next appointment:   6 month(s)  The format for your next appointment:   In Person  Provider:   Debbe Odea, MD   Other Instructions      Signed, Debbe Odea, MD  08/20/2020 4:40 PM     [facility]

## 2020-08-20 NOTE — Patient Instructions (Signed)

## 2020-10-03 ENCOUNTER — Other Ambulatory Visit: Payer: Self-pay | Admitting: Urology

## 2020-10-03 ENCOUNTER — Other Ambulatory Visit: Payer: Self-pay | Admitting: Primary Care

## 2020-10-03 DIAGNOSIS — I10 Essential (primary) hypertension: Secondary | ICD-10-CM

## 2020-10-03 NOTE — Telephone Encounter (Signed)
He needs to schedule an appt for his annual exam then medication can be sent in until his appt

## 2020-10-04 NOTE — Telephone Encounter (Signed)
Left message to return call to our office.  

## 2020-10-09 NOTE — Telephone Encounter (Signed)
Left message to return call to our office.  

## 2020-10-09 NOTE — Telephone Encounter (Signed)
Pt called in and he is scheduled on 5/20 @2 

## 2020-10-10 NOTE — Progress Notes (Signed)
10/18/2019 08:30 AM  [name] December 27, 1980 [id number]  Referring provider: Doreene Nest, NP [address] [place],  [place] [address]  Chief Complaint  Patient presents with  . Hypogonadism   Urological history: 1. Testosterone deficiency - managed with testosterone cypionate 200 mg/mL, 1 cc every 10 days  - testosterone 273 on 07/18/2020 - HCT and hemoglobin are WNL   2. ED - SHIM 25 - managed with sildenafil 100 mg on demand dosing  3. Urgency - I PSS 10/2 - PVR 3 mL - advised to have cysto, but patient cancelled  4. Sleep apnea -compliant with CPAP machine   HPI: Darren Mason is a 40 y.o. male who presents today for follow up.   He has urgency which has been longstanding.  He drinks mostly water.  Some ginger ales.    Patient denies any modifying or aggravating factors.  Patient denies any gross hematuria, dysuria or suprapubic/flank pain.  Patient denies any fevers, chills, nausea or vomiting.      IPSS    Row Name 10/11/20 1400         International Prostate Symptom Score   How often have you had the sensation of not emptying your bladder? Less than 1 in 5     How often have you had to urinate less than every two hours? Less than half the time     How often have you found you stopped and started again several times when you urinated? Less than 1 in 5 times     How often have you found it difficult to postpone urination? More than half the time     How often have you had a weak urinary stream? Less than 1 in 5 times     How often have you had to strain to start urination? Not at All     How many times did you typically get up at night to urinate? 1 Time     Total IPSS Score 10           Quality of Life due to urinary symptoms   If you were to spend the rest of your life with your urinary condition just the way it is now how would you feel about that? Mostly Satisfied            Score:  1-7 Mild 8-19 Moderate 20-35 Severe   Patient  still having spontaneous erections.   He denies any pain or curvature with erections.    SHIM    Row Name 10/11/20 1420         SHIM: Over the last 6 months:   How do you rate your confidence that you could get and keep an erection? Very High     When you had erections with sexual stimulation, how often were your erections hard enough for penetration (entering your partner)? Almost Always or Always     During sexual intercourse, how often were you able to maintain your erection after you had penetrated (entered) your partner? Almost Always or Always     During sexual intercourse, how difficult was it to maintain your erection to completion of intercourse? Not Difficult     When you attempted sexual intercourse, how often was it satisfactory for you? Almost Always or Always           SHIM Total Score   SHIM 25             SHIM    Row Name  10/11/20 1420         SHIM: Over the last 6 months:   How do you rate your confidence that you could get and keep an erection? Very High     When you had erections with sexual stimulation, how often were your erections hard enough for penetration (entering your partner)? Almost Always or Always     During sexual intercourse, how often were you able to maintain your erection after you had penetrated (entered) your partner? Almost Always or Always     During sexual intercourse, how difficult was it to maintain your erection to completion of intercourse? Not Difficult     When you attempted sexual intercourse, how often was it satisfactory for you? Almost Always or Always           SHIM Total Score   SHIM 25            Score: 1-7 Severe ED 8-11 Moderate ED 12-16 Mild-Moderate ED 17-21 Mild ED 22-25 No ED  PMH: Past Medical History:  Diagnosis Date  . ADHD   . Anxiety and depression   . Chronic back pain   . Osteoarthritis   . Serotonin syndrome   . Sleep apnea    CPAP machine   . Testosterone deficiency     Surgical  History: Past Surgical History:  Procedure Laterality Date  . APPENDECTOMY  2001  . BACK SURGERY  05/2017    Home Medications:  Allergies as of 10/11/2020      Reactions   Nickel Rash      Medication List       Accurate as of October 11, 2020  2:49 PM. If you have any questions, ask your nurse or doctor.        buPROPion 300 MG 24 hr tablet Commonly known as: WELLBUTRIN XL Take 1 tablet (300 mg total) by mouth daily.   carisoprodol 350 MG tablet Commonly known as: SOMA Take 350 mg by mouth 3 (three) times daily as needed.   carvedilol 12.5 MG tablet Commonly known as: COREG Take 1 tablet (12.5 mg total) by mouth 2 (two) times daily.   diclofenac 75 MG EC tablet Commonly known as: VOLTAREN Take 75 mg by mouth daily.   lisdexamfetamine 70 MG capsule Commonly known as: Vyvanse Take 1 capsule (70 mg total) by mouth daily before breakfast.   losartan 100 MG tablet Commonly known as: COZAAR TAKE 1 TABLET(100 MG) BY MOUTH DAILY FOR BLOOD PRESSURE   oxyCODONE-acetaminophen 10-325 MG tablet Commonly known as: PERCOCET Take 5 tablets by mouth every 4 (four) hours as needed for pain.   pregabalin 150 MG capsule Commonly known as: LYRICA Take 150 mg by mouth 3 (three) times daily.   PreviDent 5000 Dry Mouth 1.1 % Gel dental gel Generic drug: sodium fluoride USE THREE TIMES A DAY AFTER MEALS. DO NOT RINSE.    SLS FREE WILL NOT FOAM.   sildenafil 100 MG tablet Commonly known as: VIAGRA TAKE ONE TABLET BY MOUTH DAILY AS NEEDED FOR ERECTILE DYSFUNCTION.  TAKE TWO HOURS PRIOR TO INTERCOURSE ON AN EMPTY STOMACH   testosterone cypionate 200 MG/ML injection Commonly known as: DEPOTESTOSTERONE CYPIONATE Inject 1 mL every 10 days What changed: See the new instructions. Changed by: Michiel Cowboy, PA-C       Allergies:  Allergies  Allergen Reactions  . Nickel Rash    Family History: Family History  Problem Relation Age of Onset  . Hypertension Mother   .  Hyperlipidemia Mother   .  Colon cancer Maternal Grandmother   . Hypertension Father   . Hyperlipidemia Father   . Prostate cancer Neg Hx   . Kidney cancer Neg Hx   . Bladder Cancer Neg Hx     Social History:  reports that he has never smoked. His smokeless tobacco use includes chew. He reports current alcohol use. He reports that he does not use drugs.  ROS For pertinent review of systems please refer to history of present illness  Physical Exam: Ht 6\' 1"  (1.854 m)   Wt 246 lb (111.6 kg)   BMI 32.46 kg/m   Constitutional:  Well nourished. Alert and oriented, No acute distress. HEENT: [place] AT, mask in place.  Trachea midline Cardiovascular: No clubbing, cyanosis, or edema. Respiratory: Normal respiratory effort, no increased work of breathing. Neurologic: Grossly intact, no focal deficits, moving all 4 extremities. Psychiatric: Normal mood and affect.  Laboratory Data: No recent labs  Pertinent Imaging No recent imaging  Assessment & Plan:    1. Testosterone Deficiency -patient's testosterone cypionate was refilled today -start testosterone cypionate 200 mg/cc, 1 cc every 10 days  2. ED - refill given of sildenafil 100 mg  3. Urgency - not bothersome    , [facility] [address] [place], [place] [phone]

## 2020-10-11 ENCOUNTER — Ambulatory Visit: Payer: Managed Care, Other (non HMO) | Admitting: Urology

## 2020-10-11 ENCOUNTER — Other Ambulatory Visit: Payer: Self-pay

## 2020-10-11 ENCOUNTER — Encounter: Payer: Self-pay | Admitting: Urology

## 2020-10-11 VITALS — Ht 73.0 in | Wt 246.0 lb

## 2020-10-11 DIAGNOSIS — R3915 Urgency of urination: Secondary | ICD-10-CM

## 2020-10-11 DIAGNOSIS — E349 Endocrine disorder, unspecified: Secondary | ICD-10-CM | POA: Diagnosis not present

## 2020-10-11 DIAGNOSIS — N529 Male erectile dysfunction, unspecified: Secondary | ICD-10-CM | POA: Diagnosis not present

## 2020-10-11 MED ORDER — TESTOSTERONE CYPIONATE 200 MG/ML IM SOLN: INTRAMUSCULAR | 0 refills | Status: DC

## 2020-10-11 MED ORDER — SILDENAFIL CITRATE 100 MG PO TABS: ORAL_TABLET | ORAL | 3 refills | Status: DC

## 2020-10-12 ENCOUNTER — Telehealth (HOSPITAL_COMMUNITY): Payer: Self-pay | Admitting: *Deleted

## 2020-10-12 MED ORDER — LISDEXAMFETAMINE DIMESYLATE 70 MG PO CAPS: 70.0000 mg | ORAL_CAPSULE | Freq: Every day | ORAL | 0 refills | Status: DC

## 2020-10-12 NOTE — Telephone Encounter (Signed)
Done

## 2020-10-12 NOTE — Telephone Encounter (Signed)
Pt of Dr. Hinton Dyer, formerly, calling to request refill of VYVanse. Pt has not been assigned to new provider yet.

## 2020-11-06 ENCOUNTER — Other Ambulatory Visit: Payer: Self-pay | Admitting: Urology

## 2020-11-06 DIAGNOSIS — E349 Endocrine disorder, unspecified: Secondary | ICD-10-CM

## 2020-11-12 ENCOUNTER — Other Ambulatory Visit: Payer: Self-pay

## 2020-11-14 ENCOUNTER — Other Ambulatory Visit: Payer: Self-pay

## 2020-11-14 ENCOUNTER — Other Ambulatory Visit: Payer: Managed Care, Other (non HMO)

## 2020-11-14 DIAGNOSIS — E349 Endocrine disorder, unspecified: Secondary | ICD-10-CM

## 2020-11-15 LAB — TESTOSTERONE: Testosterone: 692 ng/dL (ref 264–916)

## 2020-11-15 LAB — HEMOGLOBIN AND HEMATOCRIT, BLOOD
Hematocrit: 47.5 % (ref 37.5–51.0)
Hemoglobin: 16.3 g/dL (ref 13.0–17.7)

## 2020-11-19 ENCOUNTER — Telehealth: Payer: Self-pay

## 2020-11-19 DIAGNOSIS — E349 Endocrine disorder, unspecified: Secondary | ICD-10-CM

## 2020-11-19 NOTE — Telephone Encounter (Signed)
Patient notified and scheduled for a 19mo apt and blood draw prior at mid level based off of 10 day injection. Patient verbalized understanding that if his injection day altered he would need to call the office and reschedule his blood draw to a different day to ensure it was a mid level draw.

## 2020-11-19 NOTE — Telephone Encounter (Signed)
-----   Message from [name], PA-C sent at 11/19/2020  8:07 AM EDT ----- We need to ask Corrion when he gives himself his testosterone injection and then schedule his labs at the midway point.     ----- Message ----- From: Sueanne Margarita, CMA Sent: 11/15/2020   3:05 PM EDT To: [name], PA-C  Can you please clarify?  When does pt need to be seen in ? What is the midway point?

## 2020-11-22 ENCOUNTER — Encounter: Payer: Managed Care, Other (non HMO) | Admitting: Primary Care

## 2020-11-23 ENCOUNTER — Other Ambulatory Visit: Payer: Self-pay

## 2020-11-23 ENCOUNTER — Encounter: Payer: Self-pay | Admitting: Primary Care

## 2020-11-23 ENCOUNTER — Ambulatory Visit (INDEPENDENT_AMBULATORY_CARE_PROVIDER_SITE_OTHER): Payer: Managed Care, Other (non HMO) | Admitting: Primary Care

## 2020-11-23 VITALS — BP 136/78 | HR 100 | Temp 98.9°F | Ht 73.0 in | Wt 241.0 lb

## 2020-11-23 DIAGNOSIS — F419 Anxiety disorder, unspecified: Secondary | ICD-10-CM

## 2020-11-23 DIAGNOSIS — E349 Endocrine disorder, unspecified: Secondary | ICD-10-CM

## 2020-11-23 DIAGNOSIS — F32A Depression, unspecified: Secondary | ICD-10-CM

## 2020-11-23 DIAGNOSIS — I10 Essential (primary) hypertension: Secondary | ICD-10-CM | POA: Diagnosis not present

## 2020-11-23 DIAGNOSIS — Z0000 Encounter for general adult medical examination without abnormal findings: Secondary | ICD-10-CM

## 2020-11-23 DIAGNOSIS — G8929 Other chronic pain: Secondary | ICD-10-CM

## 2020-11-23 DIAGNOSIS — M1990 Unspecified osteoarthritis, unspecified site: Secondary | ICD-10-CM

## 2020-11-23 DIAGNOSIS — M5440 Lumbago with sciatica, unspecified side: Secondary | ICD-10-CM | POA: Diagnosis not present

## 2020-11-23 NOTE — Assessment & Plan Note (Signed)
Follows with Urology for testosterone supplementation. Continue same.

## 2020-11-23 NOTE — Progress Notes (Signed)
Subjective:    Patient ID: Darren Mason, male    DOB: Jul 10, 1980, 40 y.o.   MRN: [id number]  HPI  Darren Mason is a very pleasant 40 y.o. male who presents today for complete physical.  Immunizations: -Tetanus: Unsure, believes around 9-10 years.  -Influenza: Completed last season  -Covid-19: Three vaccines  Diet: He endorses a fair diet.  Exercise: He is not exercising much.   Eye exam: No recent exam Dental exam: Completes semi-annually   BP Readings from Last 3 Encounters:  11/23/20 136/78  08/20/20 (!) 150/106  07/26/20 (!) 142/87   He checks his BP at home which runs 130's/80's-90's. He is compliant to his losartan 100 mg daily.   Review of Systems  Constitutional: Negative for unexpected weight change.  HENT: Negative for rhinorrhea.   Eyes: Negative for visual disturbance.  Respiratory: Negative for cough and shortness of breath.   Cardiovascular: Negative for chest pain.  Gastrointestinal: Negative for constipation and diarrhea.  Genitourinary: Negative for difficulty urinating.  Musculoskeletal: Positive for arthralgias and back pain.  Skin: Negative for rash.  Allergic/Immunologic: Negative for environmental allergies.  Neurological: Negative for dizziness and headaches.  Psychiatric/Behavioral: The patient is not nervous/anxious.          Past Medical History:  Diagnosis Date  . ADHD   . Anxiety and depression   . Chronic back pain   . Osteoarthritis   . Serotonin syndrome   . Sleep apnea    CPAP machine   . Testosterone deficiency     Social History   Socioeconomic History  . Marital status: Married    Spouse name: Not on file  . Number of children: 1  . Years of education: Not on file  . Highest education level: Not on file  Occupational History  . Not on file  Tobacco Use  . Smoking status: Never Smoker  . Smokeless tobacco: Current User    Types: Chew  Vaping Use  . Vaping Use: Never used  Substance and Sexual Activity  .  Alcohol use: Yes    Comment: occasionally  . Drug use: No  . Sexual activity: Yes    Comment: wife gats injection  Other Topics Concern  . Not on file  Social History Narrative   On disability from [employer].   Social Determinants of Health   Financial Resource Strain: Not on file  Food Insecurity: Not on file  Transportation Needs: Not on file  Physical Activity: Not on file  Stress: Not on file  Social Connections: Not on file  Intimate Partner Violence: Not on file    Past Surgical History:  Procedure Laterality Date  . APPENDECTOMY  2001  . BACK SURGERY  05/2017  . BACK SURGERY  04/06/2020   stemulator placed     Family History  Problem Relation Age of Onset  . Hypertension Mother   . Hyperlipidemia Mother   . Colon cancer Maternal Grandmother   . Hypertension Father   . Hyperlipidemia Father   . Prostate cancer Neg Hx   . Kidney cancer Neg Hx   . Bladder Cancer Neg Hx     Allergies  Allergen Reactions  . Nickel Rash    Current Outpatient Medications on File Prior to Visit  Medication Sig Dispense Refill  . buPROPion (WELLBUTRIN XL) 300 MG 24 hr tablet Take 1 tablet (300 mg total) by mouth daily. 90 tablet 1  . carisoprodol (SOMA) 350 MG tablet Take 350 mg by mouth 3 (  three) times daily as needed.     . carvedilol (COREG) 12.5 MG tablet Take 1 tablet (12.5 mg total) by mouth 2 (two) times daily. 180 tablet 2  . diclofenac (VOLTAREN) 75 MG EC tablet Take 75 mg by mouth daily.    Marland Kitchen lisdexamfetamine (VYVANSE) 70 MG capsule Take 1 capsule (70 mg total) by mouth daily before breakfast. 30 capsule 0  . losartan (COZAAR) 100 MG tablet TAKE 1 TABLET(100 MG) BY MOUTH DAILY FOR BLOOD PRESSURE 90 tablet 0  . oxyCODONE-acetaminophen (PERCOCET) 10-325 MG tablet Take 5 tablets by mouth every 4 (four) hours as needed for pain.    . pregabalin (LYRICA) 150 MG capsule Take 150 mg by mouth 3 (three) times daily.    Marland Kitchen PREVIDENT 5000 DRY MOUTH 1.1 % GEL dental  gel USE THREE TIMES A DAY AFTER MEALS. DO NOT RINSE.    SLS FREE WILL NOT FOAM.    . sildenafil (VIAGRA) 100 MG tablet TAKE ONE TABLET BY MOUTH DAILY AS NEEDED FOR ERECTILE DYSFUNCTION.  TAKE TWO HOURS PRIOR TO INTERCOURSE ON AN EMPTY STOMACH 30 tablet 3  . testosterone cypionate (DEPOTESTOSTERONE CYPIONATE) 200 MG/ML injection INJECT 1 ML INTO THE MUSCLE EVERY 10 DAYS 3 mL 1   No current facility-administered medications on file prior to visit.    BP 136/78   Pulse 100   Temp 98.9 F (37.2 C) (Temporal)   Ht 6\' 1"  (1.854 m)   Wt 241 lb (109.3 kg)   SpO2 96%   BMI 31.80 kg/m  Objective:   Physical Exam HENT:     Right Ear: Tympanic membrane and ear canal normal.     Left Ear: Tympanic membrane and ear canal normal.     Nose: Nose normal.     Right Sinus: No maxillary sinus tenderness or frontal sinus tenderness.     Left Sinus: No maxillary sinus tenderness or frontal sinus tenderness.  Eyes:     Conjunctiva/sclera: Conjunctivae normal.     Pupils: Pupils are equal, round, and reactive to light.  Neck:     Thyroid: No thyromegaly.     Vascular: No carotid bruit.  Cardiovascular:     Rate and Rhythm: Normal rate and regular rhythm.     Heart sounds: Normal heart sounds.  Pulmonary:     Effort: Pulmonary effort is normal.     Breath sounds: Normal breath sounds. No wheezing or rales.  Abdominal:     General: Bowel sounds are normal.     Palpations: Abdomen is soft.     Tenderness: There is no abdominal tenderness.  Musculoskeletal:        General: Normal range of motion.     Cervical back: Neck supple.  Skin:    General: Skin is warm and dry.  Neurological:     Mental Status: He is alert and oriented to person, place, and time.     Cranial Nerves: No cranial nerve deficit.     Deep Tendon Reflexes: Reflexes are normal and symmetric.  Psychiatric:        Mood and Affect: Mood normal.           Assessment & Plan:      This visit occurred during the  SARS-CoV-2 public health emergency.  Safety protocols were in place, including screening questions prior to the visit, additional usage of staff PPE, and extensive cleaning of exam room while observing appropriate contact time as indicated for disinfecting solutions.

## 2020-11-23 NOTE — Assessment & Plan Note (Signed)
Overall doing well, follows with rheumatology.  Continue Soma 350 mg and Lyrica 150 mg TID.

## 2020-11-23 NOTE — Patient Instructions (Signed)
Continue to work on a healthy diet. Ensure you are consuming 64 ounces of water daily.  Please provide me with a copy of your labs.  It was a pleasure to see you today!   Preventive Care 40-40 Years Old, Male Preventive care refers to lifestyle choices and visits with your health care provider that can promote health and wellness. This includes:  A yearly physical exam. This is also called an annual wellness visit.  Regular dental and eye exams.  Immunizations.  Screening for certain conditions.  Healthy lifestyle choices, such as: ? Eating a healthy diet. ? Getting regular exercise. ? Not using drugs or products that contain nicotine and tobacco. ? Limiting alcohol use. What can I expect for my preventive care visit? Physical exam Your health care provider may check your:  Height and weight. These may be used to calculate your BMI (body mass index). BMI is a measurement that tells if you are at a healthy weight.  Heart rate and blood pressure.  Body temperature.  Skin for abnormal spots. Counseling Your health care provider may ask you questions about your:  Past medical problems.  Family's medical history.  Alcohol, tobacco, and drug use.  Emotional well-being.  Home life and relationship well-being.  Sexual activity.  Diet, exercise, and sleep habits.  Work and work Astronomer.  Access to firearms. What immunizations do I need? Vaccines are usually given at various ages, according to a schedule. Your health care provider will recommend vaccines for you based on your age, medical history, and lifestyle or other factors, such as travel or where you work.   What tests do I need? Blood tests  Lipid and cholesterol levels. These may be checked every 5 years starting at age 58.  Hepatitis C test.  Hepatitis B test. Screening  Diabetes screening. This is done by checking your blood sugar (glucose) after you have not eaten for a while  (fasting).  Genital exam to check for testicular cancer or hernias.  STD (sexually transmitted disease) testing, if you are at risk. Talk with your health care provider about your test results, treatment options, and if necessary, the need for more tests.   Follow these instructions at home: Eating and drinking  Eat a healthy diet that includes fresh fruits and vegetables, whole grains, lean protein, and low-fat dairy products.  Drink enough fluid to keep your urine pale yellow.  Take vitamin and mineral supplements as recommended by your health care provider.  Do not drink alcohol if your health care provider tells you not to drink.  If you drink alcohol: ? Limit how much you have to 0-2 drinks a day. ? Be aware of how much alcohol is in your drink. In the [place] one drink equals one 12 oz bottle of beer (355 mL), one 5 oz glass of wine (148 mL), or one 1 oz glass of hard liquor (44 mL).   Lifestyle  Take daily care of your teeth and gums. Brush your teeth every morning and night with fluoride toothpaste. Floss one time each day.  Stay active. Exercise for at least 30 minutes 5 or more days each week.  Do not use any products that contain nicotine or tobacco, such as cigarettes, e-cigarettes, and chewing tobacco. If you need help quitting, ask your health care provider.  Do not use drugs.  If you are sexually active, practice safe sex. Use a condom or other form of protection to prevent STIs (sexually transmitted infections).  Find healthy ways  to cope with stress, such as: ? Meditation, yoga, or listening to music. ? Journaling. ? Talking to a trusted person. ? Spending time with friends and family. Safety  Always wear your seat belt while driving or riding in a vehicle.  Do not drive: ? If you have been drinking alcohol. Do not ride with someone who has been drinking. ? When you are tired or distracted. ? While texting.  Wear a helmet and other protective equipment  during sports activities.  If you have firearms in your house, make sure you follow all gun safety procedures.  Seek help if you have been physically or sexually abused. What's next?  Go to your health care provider once a year for an annual wellness visit.  Ask your health care provider how often you should have your eyes and teeth checked.  Stay up to date on all vaccines. This information is not intended to replace advice given to you by your health care provider. Make sure you discuss any questions you have with your health care provider. Document Revised: 03/09/2019 Document Reviewed: 06/17/2018 Elsevier Patient Education  2021 [employer].

## 2020-11-23 NOTE — Assessment & Plan Note (Signed)
Follows with psychiatry, overall doing well on Vyvanse 70 mg, Wellbutrin XL 300 mg.   Continue current regimen.

## 2020-11-23 NOTE — Assessment & Plan Note (Addendum)
Borderline today, he will continue to monitor, work on diet and weight loss. Discussed to notify if levels remain at or above 130's/90's.   Continue losartan 100 mg daily and carvedilol 12.5 mg BID.  Follows with cardiology.

## 2020-11-23 NOTE — Assessment & Plan Note (Signed)
Follows with pain management. Overall doing well.   Continue Percocet 10-325 mg.

## 2020-11-23 NOTE — Assessment & Plan Note (Signed)
Tetanus may be due, declines today.  Discussed the importance of a healthy diet and regular exercise in order for weight loss, and to reduce the risk of any potential medical problems.  Exam today stable. Labs completed per patient, he will provide [place] with a copy.

## 2020-12-04 ENCOUNTER — Other Ambulatory Visit: Payer: Self-pay | Admitting: Primary Care

## 2020-12-04 DIAGNOSIS — I10 Essential (primary) hypertension: Secondary | ICD-10-CM

## 2020-12-10 ENCOUNTER — Telehealth (HOSPITAL_COMMUNITY): Payer: Self-pay | Admitting: *Deleted

## 2020-12-10 DIAGNOSIS — F419 Anxiety disorder, unspecified: Secondary | ICD-10-CM

## 2020-12-10 DIAGNOSIS — F32A Depression, unspecified: Secondary | ICD-10-CM

## 2020-12-10 MED ORDER — LISDEXAMFETAMINE DIMESYLATE 70 MG PO CAPS: 70.0000 mg | ORAL_CAPSULE | Freq: Every day | ORAL | 0 refills | Status: DC

## 2020-12-10 MED ORDER — BUPROPION HCL ER (XL) 300 MG PO TB24: 300.0000 mg | ORAL_TABLET | Freq: Every day | ORAL | 0 refills | Status: DC

## 2020-12-10 NOTE — Telephone Encounter (Signed)
From last visit on 08/16/20 by Dr Cottie Banda  "Plan: We will continue bupropion XL 300 mg daily andVyvanse 70 mg daily."  A thirty days bridge supply of Wellbutrin XL 300 mg and Vyvanse 70 mg is given by Covering MD. Patient must established care with new provider.

## 2020-12-10 NOTE — Telephone Encounter (Signed)
Former pt of Dr. Hinton Dyer requesting 30 day supply of Wellbutrin and VyvAnse. Pt states that he received letter and is loking for a new provider. Please review.

## 2021-01-01 ENCOUNTER — Other Ambulatory Visit: Payer: Self-pay | Admitting: Urology

## 2021-01-01 DIAGNOSIS — E349 Endocrine disorder, unspecified: Secondary | ICD-10-CM

## 2021-01-02 ENCOUNTER — Other Ambulatory Visit (HOSPITAL_COMMUNITY): Payer: Self-pay | Admitting: *Deleted

## 2021-01-02 DIAGNOSIS — F419 Anxiety disorder, unspecified: Secondary | ICD-10-CM

## 2021-01-09 ENCOUNTER — Telehealth (HOSPITAL_COMMUNITY): Payer: Self-pay

## 2021-01-09 ENCOUNTER — Telehealth (HOSPITAL_COMMUNITY): Payer: Self-pay | Admitting: *Deleted

## 2021-01-09 DIAGNOSIS — F419 Anxiety disorder, unspecified: Secondary | ICD-10-CM

## 2021-01-09 MED ORDER — BUPROPION HCL ER (XL) 300 MG PO TB24: 300.0000 mg | ORAL_TABLET | Freq: Every day | ORAL | 0 refills | Status: DC

## 2021-01-09 NOTE — Telephone Encounter (Signed)
His Wellbutrin was sent on June 6 which is 30 days ago.  Not sure, did he find out today that insurance does not cover.  Please call the pharmacy to clarify when did he last picked up the Wellbutrin.

## 2021-01-09 NOTE — Telephone Encounter (Signed)
This is a former patient of Dr. Hinton Dyer. Patient called regarding his Wellbutrin. A 30 day bridge supply of his Bupropion 300mg  [facility] [place], but his insurance will not cover it at that pharmacy. It needs to be resent to [facility] on [address]/[place] in order for his insurance to cover the medication. Thank you

## 2021-01-09 NOTE — Telephone Encounter (Signed)
Pt has recently changed to [employer] and they will only allow 90 day maintenance scripts to be filled at [facility]. Writer [facility] and [facility]. Pt has not filled the last Wellbutrin or [facility] as insurance will not pay and pt can't afford to pay out of pocket. I feel tha tunder the circumstances they will accept 30 day supplies. Pt is working with a community support service/person to find new provider. Pam calling pharmacies again to verify, altough this nurse has spoken with both pharmacies in question. Thanks.

## 2021-01-09 NOTE — Telephone Encounter (Signed)
Send to [facility] at [facility].

## 2021-01-09 NOTE — Telephone Encounter (Signed)
error 
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
## 2021-01-11 ENCOUNTER — Other Ambulatory Visit: Payer: Self-pay

## 2021-01-11 ENCOUNTER — Encounter: Payer: Self-pay | Admitting: Psychiatry

## 2021-01-11 ENCOUNTER — Telehealth (INDEPENDENT_AMBULATORY_CARE_PROVIDER_SITE_OTHER): Payer: 59 | Admitting: Psychiatry

## 2021-01-11 DIAGNOSIS — F330 Major depressive disorder, recurrent, mild: Secondary | ICD-10-CM | POA: Diagnosis not present

## 2021-01-11 DIAGNOSIS — F900 Attention-deficit hyperactivity disorder, predominantly inattentive type: Secondary | ICD-10-CM

## 2021-01-11 MED ORDER — LISDEXAMFETAMINE DIMESYLATE 70 MG PO CAPS: 70.0000 mg | ORAL_CAPSULE | Freq: Every day | ORAL | 0 refills | Status: DC

## 2021-01-11 MED ORDER — BUPROPION HCL ER (XL) 300 MG PO TB24
300.0000 mg | ORAL_TABLET | Freq: Every day | ORAL | 0 refills | Status: DC
Start: 2021-01-11 — End: 2021-02-28

## 2021-01-11 NOTE — Patient Instructions (Signed)
Restart bupropion 300 mg daily Restart Vyvanse 70 mg daily  Next appointment- 8/25 at 11:30

## 2021-02-18 ENCOUNTER — Encounter: Payer: Self-pay | Admitting: Cardiology

## 2021-02-18 ENCOUNTER — Ambulatory Visit: Payer: Managed Care, Other (non HMO) | Admitting: Cardiology

## 2021-02-18 ENCOUNTER — Other Ambulatory Visit: Payer: Self-pay

## 2021-02-18 VITALS — BP 140/82 | HR 81 | Ht 73.0 in | Wt 232.0 lb

## 2021-02-18 DIAGNOSIS — I10 Essential (primary) hypertension: Secondary | ICD-10-CM

## 2021-02-18 DIAGNOSIS — R000 Tachycardia, unspecified: Secondary | ICD-10-CM

## 2021-02-18 NOTE — Progress Notes (Signed)
Cardiology Office Note:    Date:  02/18/2021   ID:  Darren Mason, DOB Mar 05, 1981, MRN [id number]  PCP:  Doreene Nest, NP  Cardiologist:  Debbe Odea, MD  Electrophysiologist:  None   Referring MD: Doreene Nest, NP   Chief Complaint  Patient presents with   Other    6 month follow up. Meds reviewed verbally with patient.     History of Present Illness:    Darren Mason is a 40 y.o. male with a hx of hypertension, ADHD, anxiety, testosterone deficiency who presents for follow-up.  He was last seen due to elevated blood pressures.   Patient being seen for elevated blood pressures and tachycardia.  Carvedilol titrated to 12.5 mg twice daily, takes losartan 100 mg daily.  He feels well, blood pressures well controlled at home with systolics in the 130s.  He otherwise has no concerns at this time.  Prior notes Patient takes Vyvanse for ADHD and testosterone cypionate for low testosterone all likely contributing to elevated blood pressures..        Past Medical History:  Diagnosis Date   ADHD    Anxiety and depression    Chronic back pain    Osteoarthritis    Serotonin syndrome    Sleep apnea    CPAP machine    Testosterone deficiency     Past Surgical History:  Procedure Laterality Date   APPENDECTOMY  2001   BACK SURGERY  05/2017   BACK SURGERY  04/06/2020   stemulator placed     Current Medications: Current Meds  Medication Sig   buPROPion (WELLBUTRIN XL) 300 MG 24 hr tablet Take 1 tablet (300 mg total) by mouth daily.   carisoprodol (SOMA) 350 MG tablet Take 350 mg by mouth 3 (three) times daily as needed.    carvedilol (COREG) 12.5 MG tablet Take 1 tablet (12.5 mg total) by mouth 2 (two) times daily.   diclofenac (VOLTAREN) 75 MG EC tablet Take 75 mg by mouth daily.   lisdexamfetamine (VYVANSE) 70 MG capsule Take 1 capsule (70 mg total) by mouth daily before breakfast.   lisdexamfetamine (VYVANSE) 70 MG capsule Take 1 capsule (70 mg total) by  mouth daily before breakfast.   losartan (COZAAR) 100 MG tablet TAKE 1 TABLET(100 MG) BY MOUTH DAILY FOR BLOOD PRESSURE   oxyCODONE-acetaminophen (PERCOCET) 10-325 MG tablet Take 5 tablets by mouth every 4 (four) hours as needed for pain.   pregabalin (LYRICA) 150 MG capsule Take 150 mg by mouth 3 (three) times daily.   PREVIDENT 5000 DRY MOUTH 1.1 % GEL dental gel USE THREE TIMES A DAY AFTER MEALS. DO NOT RINSE.    SLS FREE WILL NOT FOAM.   sildenafil (VIAGRA) 100 MG tablet TAKE ONE TABLET BY MOUTH DAILY AS NEEDED FOR ERECTILE DYSFUNCTION.  TAKE TWO HOURS PRIOR TO INTERCOURSE ON AN EMPTY STOMACH   testosterone cypionate (DEPOTESTOSTERONE CYPIONATE) 200 MG/ML injection INJECT 1 ML INTO THE MUSCLE EVERY 10 DAYS     Allergies:   Nickel   Social History   Socioeconomic History   Marital status: Married    Spouse name: Not on file   Number of children: 1   Years of education: Not on file   Highest education level: Not on file  Occupational History   Not on file  Tobacco Use   Smoking status: Never   Smokeless tobacco: Current    Types: Chew  Vaping Use   Vaping Use: Never used  Substance and Sexual Activity  Alcohol use: Yes    Comment: occasionally   Drug use: No   Sexual activity: Yes    Comment: wife gats injection  Other Topics Concern   Not on file  Social History Narrative   On disability from [employer].   Social Determinants of Health   Financial Resource Strain: Not on file  Food Insecurity: Not on file  Transportation Needs: Not on file  Physical Activity: Not on file  Stress: Not on file  Social Connections: Not on file     Family History: The patient's family history includes ADD / ADHD in his brother; Colon cancer in his maternal grandmother; Hyperlipidemia in his father and mother; Hypertension in his father and mother. There is no history of Prostate cancer, Kidney cancer, or Bladder Cancer.  ROS:   Please see the history of present  illness.     All other systems reviewed and are negative.  EKGs/Labs/Other Studies Reviewed:    The following studies were reviewed today:   EKG:  EKG is  ordered today.  The ekg ordered today demonstrates normal sinus rhythm, normal ECG.    Recent Labs: 11/14/2020: Hemoglobin 16.3  Recent Lipid Panel    Component Value Date/Time   TRIG 266 (H) 01/19/2017 1912    Physical Exam:    VS:  BP 140/82 (BP Location: Left Arm, Patient Position: Sitting, Cuff Size: Normal)   Pulse 81   Ht 6\' 1"  (1.854 m)   Wt 232 lb (105.2 kg)   SpO2 97%   BMI 30.61 kg/m     Wt Readings from Last 3 Encounters:  02/18/21 232 lb (105.2 kg)  11/23/20 241 lb (109.3 kg)  10/11/20 246 lb (111.6 kg)     GEN:  Well nourished, well developed, mild distress HEENT: Normal NECK: No JVD; No carotid bruits LYMPHATICS: No lymphadenopathy CARDIAC: RRR, no murmurs, rubs, gallops RESPIRATORY:  Clear to auscultation without rales, wheezing or rhonchi  ABDOMEN: Soft, non-tender, non-distended MUSCULOSKELETAL:  No edema; No deformity  SKIN: Warm and dry NEUROLOGIC:  Alert and oriented x 3 PSYCHIATRIC:  Normal affect   ASSESSMENT:    1. Primary hypertension   2. Tachycardia     PLAN:    In order of problems listed above:  Hypertension, BP controlled at home, slightly elevated in the office.  May have a component of whitecoat syndrome/anxiety.  Continue losartan, Coreg.  Testosterone, Vyvanse also likely contributing to elevated blood pressures. Tachycardia, improved with beta-blocker.  Continue Coreg as above.  Follow-up in 12 months   This note was generated in part or whole with voice recognition software. Voice recognition is usually quite accurate but there are transcription errors that can and very often do occur. I apologize for any typographical errors that were not detected and corrected.  Medication Adjustments/Labs and Tests Ordered: Current medicines are reviewed at length with the patient  today.  Concerns regarding medicines are outlined above.  Orders Placed This Encounter  Procedures   EKG 12-Lead    No orders of the defined types were placed in this encounter.   Patient Instructions  Medication Instructions:  Your physician recommends that you continue on your current medications as directed. Please refer to the Current Medication list given to you today.  *If you need a refill on your cardiac medications before your next appointment, please call your pharmacy*   Lab Work: None ordered If you have labs (blood work) drawn today and your tests are completely normal, you will receive  your results only by: MyChart Message (if you have MyChart) OR A paper copy in the mail If you have any lab test that is abnormal or we need to change your treatment, we will call you to review the results.   Testing/Procedures: None ordered   Follow-Up: At [facility], you and your health needs are our priority.  As part of our continuing mission to provide you with exceptional heart care, we have created designated Provider Care Teams.  These Care Teams include your primary Cardiologist (physician) and Advanced Practice Providers (APPs -  Physician Assistants and Nurse Practitioners) who all work together to provide you with the care you need, when you need it.  We recommend signing up for the patient portal called "MyChart".  Sign up information is provided on this After Visit Summary.  MyChart is used to connect with patients for Virtual Visits (Telemedicine).  Patients are able to view lab/test results, encounter notes, upcoming appointments, etc.  Non-urgent messages can be sent to your provider as well.   To learn more about what you can do with MyChart, go to [email or web address].    Your next appointment:   1 year(s)  The format for your next appointment:   In Person  Provider:   Debbe Odea, MD   Other Instructions    Signed, Debbe Odea, MD   02/18/2021 4:44 PM    [facility]

## 2021-02-18 NOTE — Patient Instructions (Signed)

## 2021-02-25 ENCOUNTER — Other Ambulatory Visit: Payer: Self-pay | Admitting: Urology

## 2021-02-25 DIAGNOSIS — E349 Endocrine disorder, unspecified: Secondary | ICD-10-CM
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
## 2021-02-28 ENCOUNTER — Other Ambulatory Visit: Payer: Self-pay

## 2021-02-28 ENCOUNTER — Encounter: Payer: Self-pay | Admitting: Psychiatry

## 2021-02-28 ENCOUNTER — Telehealth (INDEPENDENT_AMBULATORY_CARE_PROVIDER_SITE_OTHER): Payer: 59 | Admitting: Psychiatry

## 2021-02-28 DIAGNOSIS — F330 Major depressive disorder, recurrent, mild: Secondary | ICD-10-CM

## 2021-02-28 DIAGNOSIS — F900 Attention-deficit hyperactivity disorder, predominantly inattentive type: Secondary | ICD-10-CM

## 2021-02-28 MED ORDER — BUPROPION HCL ER (XL) 300 MG PO TB24: 300.0000 mg | ORAL_TABLET | Freq: Every day | ORAL | 1 refills | Status: DC

## 2021-02-28 MED ORDER — LISDEXAMFETAMINE DIMESYLATE 70 MG PO CAPS: 70.0000 mg | ORAL_CAPSULE | Freq: Every day | ORAL | 0 refills | Status: DC

## 2021-02-28 NOTE — Patient Instructions (Signed)
Continue bupropion 300 mg daily Continue Vyvanse 70 mg daily  Next appointment- 11/4 at 11:30

## 2021-04-22 ENCOUNTER — Other Ambulatory Visit: Payer: Self-pay | Admitting: Urology

## 2021-04-22 DIAGNOSIS — E349 Endocrine disorder, unspecified: Secondary | ICD-10-CM

## 2021-05-14 ENCOUNTER — Telehealth: Payer: Self-pay

## 2021-05-14 DIAGNOSIS — F419 Anxiety disorder, unspecified: Secondary | ICD-10-CM

## 2021-05-14 DIAGNOSIS — F900 Attention-deficit hyperactivity disorder, predominantly inattentive type: Secondary | ICD-10-CM

## 2021-05-14 DIAGNOSIS — F32A Depression, unspecified: Secondary | ICD-10-CM

## 2021-05-14 MED ORDER — LISDEXAMFETAMINE DIMESYLATE 70 MG PO CAPS: 70.0000 mg | ORAL_CAPSULE | Freq: Every day | ORAL | 0 refills | Status: DC

## 2021-05-14 NOTE — Telephone Encounter (Signed)
I have sent limited supply of Vyvanse to pharmacy.  Patient does have upcoming appointment.

## 2021-05-14 NOTE — Telephone Encounter (Signed)
pt called left message that he needs refills on the vyvanse
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
## 2021-05-16 ENCOUNTER — Other Ambulatory Visit: Payer: Self-pay | Admitting: Urology

## 2021-05-17 ENCOUNTER — Other Ambulatory Visit: Payer: Self-pay

## 2021-05-17 ENCOUNTER — Other Ambulatory Visit: Payer: Managed Care, Other (non HMO)

## 2021-05-17 DIAGNOSIS — E349 Endocrine disorder, unspecified: Secondary | ICD-10-CM

## 2021-05-18 LAB — TESTOSTERONE: Testosterone: 307 ng/dL (ref 264–916)

## 2021-05-18 LAB — HEMOGLOBIN AND HEMATOCRIT, BLOOD
Hematocrit: 48.6 % (ref 37.5–51.0)
Hemoglobin: 16.4 g/dL (ref 13.0–17.7)

## 2021-05-20 ENCOUNTER — Other Ambulatory Visit: Payer: Self-pay

## 2021-05-20 ENCOUNTER — Encounter: Payer: Self-pay | Admitting: Psychiatry

## 2021-05-20 ENCOUNTER — Telehealth (INDEPENDENT_AMBULATORY_CARE_PROVIDER_SITE_OTHER): Payer: 59 | Admitting: Psychiatry

## 2021-05-20 DIAGNOSIS — F330 Major depressive disorder, recurrent, mild: Secondary | ICD-10-CM | POA: Diagnosis not present

## 2021-05-20 DIAGNOSIS — F900 Attention-deficit hyperactivity disorder, predominantly inattentive type: Secondary | ICD-10-CM

## 2021-05-20 MED ORDER — LISDEXAMFETAMINE DIMESYLATE 70 MG PO CAPS: 70.0000 mg | ORAL_CAPSULE | Freq: Every day | ORAL | 0 refills | Status: DC

## 2021-05-20 NOTE — Patient Instructions (Addendum)
Continue bupropion 300 mg daily Continue Vyvanse 70 mg daily  Next appointment- 2/6 at 10 AM, in person  The next visit will be in person visit. Please arrive 15 mins before the scheduled time.   [facility]  Address: [address], [place], [place] [address]

## 2021-05-21 NOTE — Progress Notes (Signed)
05/22/21 4:04 PM   Darren Mason 1981-03-04 [id number]  Referring provider:  Doreene Nest, NP [address] [place],  [place] [address]  Chief Complaint  Patient presents with   Follow-up    follow-up   Urological history: 1. Testosterone deficiency -contributing factors of age and obesity -testosterone level pending -HCT and hemoglobin levels pending -managed with testosterone cypionate 200 mg/mL, 1 cc every 10 days     2. ED -contributing factors of age, depression, anxiety, HTN  - SHIM 24 - managed with sildenafil 100 mg on demand dosing   3. Urgency - I PSS 12/2 - PVR 3 mL - 07/2020 - advised to have cysto, but patient cancelled   4. Sleep apnea -compliant with CPAP machine   HPI:  Darren Mason is a 40 y.o.male with a personal history of testosterone deficient, ED, urgency, and sleep apnea, who presents today for 6 month IPSS, SHIM, and exam.   He reports today that he is doing well. He is having trouble with his back due to the weather changes. He reports that his injections are going well.   He has no urinary complaints.  Patient denies any modifying or aggravating factors.  Patient denies any gross hematuria, dysuria or suprapubic/flank pain.  Patient denies any fevers, chills, nausea or vomiting.     IPSS     Row Name 05/22/21 1500         International Prostate Symptom Score   How often have you had the sensation of not emptying your bladder? Less than 1 in 5     How often have you had to urinate less than every two hours? Less than half the time     How often have you found you stopped and started again several times when you urinated? Less than half the time     How often have you found it difficult to postpone urination? More than half the time     How often have you had a weak urinary stream? Less than 1 in 5 times     How often have you had to strain to start urination? Less than 1 in 5 times     How many times did you typically get up at  night to urinate? 1 Time     Total IPSS Score 12       Quality of Life due to urinary symptoms   If you were to spend the rest of your life with your urinary condition just the way it is now how would you feel about that? Mostly Satisfied              Score:  1-7 Mild 8-19 Moderate 20-35 Severe   Patient still having spontaneous erections.  He denies any pain or curvature with erections.   SHIM     Row Name 05/22/21 1542         SHIM: Over the last 6 months:   How do you rate your confidence that you could get and keep an erection? High     When you had erections with sexual stimulation, how often were your erections hard enough for penetration (entering your partner)? Almost Always or Always     During sexual intercourse, how often were you able to maintain your erection after you had penetrated (entered) your partner? Almost Always or Always     During sexual intercourse, how difficult was it to maintain your erection to completion of intercourse? Not Difficult  When you attempted sexual intercourse, how often was it satisfactory for you? Almost Always or Always       SHIM Total Score   SHIM 24               PMH: Past Medical History:  Diagnosis Date   ADHD    Anxiety and depression    Chronic back pain    Osteoarthritis    Serotonin syndrome    Sleep apnea    CPAP machine    Testosterone deficiency     Surgical History: Past Surgical History:  Procedure Laterality Date   APPENDECTOMY  2001   BACK SURGERY  05/2017   BACK SURGERY  04/06/2020   stemulator placed     Home Medications:  Allergies as of 05/22/2021       Reactions   Nickel Rash        Medication List        Accurate as of May 22, 2021  4:04 PM. If you have any questions, ask your nurse or doctor.          STOP taking these medications    carisoprodol 350 MG tablet Commonly known as: SOMA Stopped by: SHANNON MCGOWAN, PA-C   diclofenac 75 MG EC  tablet Commonly known as: VOLTAREN Stopped by: SHANNON MCGOWAN, PA-C       TAKE these medications    buPROPion 300 MG 24 hr tablet Commonly known as: WELLBUTRIN XL Take 1 tablet (300 mg total) by mouth daily.   carvedilol 12.5 MG tablet Commonly known as: COREG Take 1 tablet (12.5 mg total) by mouth 2 (two) times daily.   lisdexamfetamine 70 MG capsule Commonly known as: Vyvanse Take 1 capsule (70 mg total) by mouth daily before breakfast.   lisdexamfetamine 70 MG capsule Commonly known as: Vyvanse Take 1 capsule (70 mg total) by mouth daily before breakfast. Start taking on: June 19, 2021   lisdexamfetamine 70 MG capsule Commonly known as: Vyvanse Take 1 capsule (70 mg total) by mouth daily before breakfast. Start taking on: July 19, 2021   losartan 100 MG tablet Commonly known as: COZAAR TAKE 1 TABLET(100 MG) BY MOUTH DAILY FOR BLOOD PRESSURE   oxyCODONE-acetaminophen 10-325 MG tablet Commonly known as: PERCOCET Take 5 tablets by mouth every 4 (four) hours as needed for pain.   pregabalin 150 MG capsule Commonly known as: LYRICA Take 150 mg by mouth 3 (three) times daily.   PreviDent 5000 Dry Mouth 1.1 % Gel dental gel Generic drug: sodium fluoride USE THREE TIMES A DAY AFTER MEALS. DO NOT RINSE.    SLS FREE WILL NOT FOAM.   sildenafil 100 MG tablet Commonly known as: VIAGRA TAKE ONE TABLET BY MOUTH DAILY AS NEEDED FOR ERECTILE DYSFUNCTION, TAKE TWO HOURS PRIOR TO INTERCOURSE ON AN EMPTY STOMACH   testosterone cypionate 200 MG/ML injection Commonly known as: DEPOTESTOSTERONE CYPIONATE INJECT 1 ML INTO THE MUSCLE EVERY 10 DAYS        Allergies:  Allergies  Allergen Reactions   Nickel Rash    Family History: Family History  Problem Relation Age of Onset   Hypertension Mother    Hyperlipidemia Mother    Hypertension Father    Hyperlipidemia Father    ADD / ADHD Brother    Colon cancer Maternal Grandmother    Prostate cancer Neg Hx     Kidney cancer Neg Hx    Bladder Cancer Neg Hx     Social History:  reports that he has never smoked.  His smokeless tobacco use includes chew. He reports current alcohol use. He reports that he does not use drugs.   Physical Exam: BP (!) 153/95   Pulse (!) 113   Ht 6\' 1"  (1.854 m)   Wt 240 lb (108.9 kg)   BMI 31.66 kg/m   Constitutional:  Alert and oriented, No acute distress. HEENT: [place] AT, mask in place.  Trachea midline Cardiovascular: No clubbing, cyanosis, or edema. Respiratory: Normal respiratory effort, no increased work of breathing. Neurologic: Grossly intact, no focal deficits, moving all 4 extremities. Psychiatric: Normal mood and affect.  Laboratory Data: Component     Latest Ref Rng & Units 11/14/2020          Hemoglobin     13.0 - 17.7 g/dL 16.3  HCT     37.5 - 51.0 % 47.5   Component     Latest Ref Rng & Units 11/14/2020  Testosterone     264 - 916 ng/dL 692  I have reviewed the labs.   Assessment & Plan:    Testosterone deficiency  -testosterone level, H & H and PSA pending -continue testosterone cypionate 200 MG/ML, 1 cc every ten days -refill for testosterone sent to pharmacy  2. ED  - continue sidenafil 100 mg, on-demand-dosing - refill given of sildenafil 100 mg   Return in 6 months for Testerone and H and H, IPSS, PSA, and [facility] [address], [place], [place] [address] [phone]  I,Kailey Littlejohn,acting as a scribe for [facility], PA-C.,have documented all relevant documentation on the behalf of SHANNON MCGOWAN, PA-C,as directed by  [facility], PA-C while in the presence of SHANNON MCGOWAN, PA-C.  I have reviewed the above documentation for accuracy and completeness, and I agree with the above.    Zara Council, PA-C

## 2021-05-22 ENCOUNTER — Encounter: Payer: Self-pay | Admitting: Urology

## 2021-05-22 ENCOUNTER — Other Ambulatory Visit: Payer: Self-pay

## 2021-05-22 ENCOUNTER — Ambulatory Visit (INDEPENDENT_AMBULATORY_CARE_PROVIDER_SITE_OTHER): Payer: Managed Care, Other (non HMO) | Admitting: Urology

## 2021-05-22 VITALS — BP 153/95 | HR 113 | Ht 73.0 in | Wt 240.0 lb

## 2021-05-22 DIAGNOSIS — E349 Endocrine disorder, unspecified: Secondary | ICD-10-CM

## 2021-05-22 DIAGNOSIS — F5221 Male erectile disorder: Secondary | ICD-10-CM

## 2021-05-22 DIAGNOSIS — N529 Male erectile dysfunction, unspecified: Secondary | ICD-10-CM

## 2021-05-22 MED ORDER — TESTOSTERONE CYPIONATE 200 MG/ML IM SOLN: INTRAMUSCULAR | 1 refills | Status: DC

## 2021-05-22 MED ORDER — SILDENAFIL CITRATE 100 MG PO TABS: ORAL_TABLET | ORAL | 3 refills | Status: DC

## 2021-05-23 LAB — PSA: Prostate Specific Ag, Serum: 1 ng/mL (ref 0.0–4.0)

## 2021-05-28 ENCOUNTER — Telehealth: Payer: 59 | Admitting: Psychiatry
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
## 2021-08-12 ENCOUNTER — Other Ambulatory Visit: Payer: Self-pay

## 2021-08-12 ENCOUNTER — Ambulatory Visit (INDEPENDENT_AMBULATORY_CARE_PROVIDER_SITE_OTHER): Payer: 59 | Admitting: Psychiatry

## 2021-08-12 ENCOUNTER — Encounter: Payer: Self-pay | Admitting: Psychiatry

## 2021-08-12 ENCOUNTER — Other Ambulatory Visit: Payer: Self-pay | Admitting: Urology

## 2021-08-12 VITALS — BP 152/87 | HR 88 | Temp 97.8°F | Wt 243.0 lb

## 2021-08-12 DIAGNOSIS — F900 Attention-deficit hyperactivity disorder, predominantly inattentive type: Secondary | ICD-10-CM | POA: Diagnosis not present

## 2021-08-12 DIAGNOSIS — F330 Major depressive disorder, recurrent, mild: Secondary | ICD-10-CM

## 2021-08-12 DIAGNOSIS — E349 Endocrine disorder, unspecified: Secondary | ICD-10-CM

## 2021-08-12 MED ORDER — LISDEXAMFETAMINE DIMESYLATE 70 MG PO CAPS: 70.0000 mg | ORAL_CAPSULE | Freq: Every day | ORAL | 0 refills | Status: DC

## 2021-08-12 MED ORDER — BUPROPION HCL ER (XL) 300 MG PO TB24: 300.0000 mg | ORAL_TABLET | Freq: Every day | ORAL | 1 refills | Status: DC

## 2021-08-12 MED ORDER — LISDEXAMFETAMINE DIMESYLATE 70 MG PO CAPS
70.0000 mg | ORAL_CAPSULE | Freq: Every day | ORAL | 0 refills | Status: DC
Start: 2021-08-19 — End: 2021-11-11

## 2021-08-12 NOTE — Patient Instructions (Signed)
Continue bupropion 300 mg daily  Continue Vyvanse 70 mg daily  Next appointment- 5/8 at 10 AM, in person

## 2021-08-23 ENCOUNTER — Other Ambulatory Visit: Payer: Self-pay | Admitting: *Deleted

## 2021-08-23 DIAGNOSIS — E349 Endocrine disorder, unspecified: Secondary | ICD-10-CM

## 2021-08-28 ENCOUNTER — Other Ambulatory Visit: Payer: Self-pay | Admitting: Urology

## 2021-10-07 ENCOUNTER — Other Ambulatory Visit: Payer: Self-pay | Admitting: Urology

## 2021-10-07 DIAGNOSIS — E349 Endocrine disorder, unspecified: Secondary | ICD-10-CM

## 2021-10-26 ENCOUNTER — Other Ambulatory Visit: Payer: Self-pay | Admitting: Urology

## 2021-10-26 DIAGNOSIS — N529 Male erectile dysfunction, unspecified: Secondary | ICD-10-CM
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
## 2021-11-09 ENCOUNTER — Other Ambulatory Visit: Payer: Self-pay | Admitting: Primary Care

## 2021-11-09 DIAGNOSIS — I10 Essential (primary) hypertension: Secondary | ICD-10-CM

## 2021-11-11 ENCOUNTER — Encounter: Payer: Self-pay | Admitting: Psychiatry

## 2021-11-11 ENCOUNTER — Ambulatory Visit (INDEPENDENT_AMBULATORY_CARE_PROVIDER_SITE_OTHER): Payer: 59 | Admitting: Psychiatry

## 2021-11-11 VITALS — BP 184/123 | HR 111 | Temp 98.1°F | Wt 241.4 lb

## 2021-11-11 DIAGNOSIS — F900 Attention-deficit hyperactivity disorder, predominantly inattentive type: Secondary | ICD-10-CM | POA: Diagnosis not present

## 2021-11-11 DIAGNOSIS — F331 Major depressive disorder, recurrent, moderate: Secondary | ICD-10-CM | POA: Diagnosis not present

## 2021-11-11 MED ORDER — QUETIAPINE FUMARATE 25 MG PO TABS: 25.0000 mg | ORAL_TABLET | Freq: Every day | ORAL | 1 refills | Status: DC

## 2021-11-11 MED ORDER — LISDEXAMFETAMINE DIMESYLATE 70 MG PO CAPS: 70.0000 mg | ORAL_CAPSULE | Freq: Every day | ORAL | 0 refills | Status: DC

## 2021-11-11 NOTE — Patient Instructions (Signed)
Continue bupropion 300 mg daily ?Start quetiapine 25 mg at night  ?Continue Vyvanse 70 mg daily  ?Next appointment- 6/12 at 10:30, in person ?

## 2021-11-15 NOTE — Telephone Encounter (Signed)
Did he already pick up losartan from the pharmacy? ?

## 2021-11-18 ENCOUNTER — Other Ambulatory Visit: Payer: Managed Care, Other (non HMO)

## 2021-11-18 DIAGNOSIS — N529 Male erectile dysfunction, unspecified: Secondary | ICD-10-CM

## 2021-11-18 DIAGNOSIS — E349 Endocrine disorder, unspecified: Secondary | ICD-10-CM

## 2021-11-18 DIAGNOSIS — F5221 Male erectile disorder: Secondary | ICD-10-CM

## 2021-11-19 LAB — HEMOGLOBIN AND HEMATOCRIT, BLOOD
Hematocrit: 49.3 % (ref 37.5–51.0)
Hemoglobin: 16.7 g/dL (ref 13.0–17.7)

## 2021-11-19 LAB — TESTOSTERONE: Testosterone: 334 ng/dL (ref 264–916)

## 2021-11-19 LAB — PSA: Prostate Specific Ag, Serum: 0.8 ng/mL (ref 0.0–4.0)

## 2021-11-19 NOTE — Progress Notes (Signed)
11/20/21 3:42 PM   Darren Mason July 26, 1980 [id number]  Referring provider:  Doreene Nest, NP [address] [place],  [place] [address]  Chief Complaint  Patient presents with   Hypogonadism    50mo follow up   Urological history: 1. Testosterone deficiency -contributing factors of age and obesity -testosterone 334, 11/2021 -HCT and hemoglobin WNL, 11/2021 -managed with testosterone cypionate 200 mg/mL, 1 cc every 10 days     2. ED -contributing factors of age, depression, anxiety, HTN  - SHIM 24 - managed with sildenafil 100 mg on demand dosing   3. Urgency - PSA 0.8, 11/2021 - I PSS 12/2 - PVR 12/2 - advised to have cysto, but patient cancelled   4. Sleep apnea -compliant with CPAP machine   HPI:  Darren Mason is a 41 y.o.male with a personal history of testosterone deficient, ED, urgency, and sleep apnea, who presents today for 6 month IPSS, SHIM, and exam.   He is doing well with the testosterone injections.    He continues to have urinary frequency.  Patient denies any modifying or aggravating factors.  Patient denies any gross hematuria, dysuria or suprapubic/flank pain.  Patient denies any fevers, chills, nausea or vomiting.      IPSS     Row Name 11/20/21 1500         International Prostate Symptom Score   How often have you had the sensation of not emptying your bladder? Less than 1 in 5     How often have you had to urinate less than every two hours? Less than half the time     How often have you found you stopped and started again several times when you urinated? Less than 1 in 5 times     How often have you found it difficult to postpone urination? More than half the time     How often have you had a weak urinary stream? Less than 1 in 5 times     How often have you had to strain to start urination? Less than half the time     How many times did you typically get up at night to urinate? 1 Time     Total IPSS Score 12       Quality of Life due  to urinary symptoms   If you were to spend the rest of your life with your urinary condition just the way it is now how would you feel about that? Mostly Satisfied               Score:  1-7 Mild 8-19 Moderate 20-35 Severe  Patient still having spontaneous erections.  He denies any pain or curvature with erections.  Doing well with the sildenafil.    SHIM     Row Name 11/20/21 1539         SHIM: Over the last 6 months:   How do you rate your confidence that you could get and keep an erection? High     When you had erections with sexual stimulation, how often were your erections hard enough for penetration (entering your partner)? Almost Always or Always     During sexual intercourse, how often were you able to maintain your erection after you had penetrated (entered) your partner? Almost Always or Always     During sexual intercourse, how difficult was it to maintain your erection to completion of intercourse? Not Difficult     When you attempted sexual intercourse, how often  was it satisfactory for you? Almost Always or Always       SHIM Total Score   SHIM 24              Score: 1-7 Severe ED 8-11 Moderate ED 12-16 Mild-Moderate ED 17-21 Mild ED 22-25 No ED       PMH: Past Medical History:  Diagnosis Date   ADHD    Anxiety and depression    Chronic back pain    Osteoarthritis    Serotonin syndrome    Sleep apnea    CPAP machine    Testosterone deficiency     Surgical History: Past Surgical History:  Procedure Laterality Date   APPENDECTOMY  2001   BACK SURGERY  05/2017   BACK SURGERY  04/06/2020   stemulator placed     Home Medications:  Allergies as of 11/20/2021       Reactions   Nickel Rash        Medication List        Accurate as of Nov 20, 2021  3:42 PM. If you have any questions, ask your nurse or doctor.          buPROPion 300 MG 24 hr tablet Commonly known as: WELLBUTRIN XL Take 1 tablet (300 mg total) by mouth  daily.   carisoprodol 350 MG tablet Commonly known as: SOMA Take 350 mg by mouth 3 (three) times daily as needed.   carvedilol 12.5 MG tablet Commonly known as: COREG Take 1 tablet (12.5 mg total) by mouth 2 (two) times daily.   lisdexamfetamine 70 MG capsule Commonly known as: Vyvanse Take 1 capsule (70 mg total) by mouth daily before breakfast.   losartan 100 MG tablet Commonly known as: COZAAR Take 1 tablet (100 mg total) by mouth daily. for blood pressure. Office visit required for further refills.   oxyCODONE-acetaminophen 10-325 MG tablet Commonly known as: PERCOCET Take 1 tablet by mouth every 4 (four) hours as needed for pain. Up to 6 tablets PRN   pregabalin 150 MG capsule Commonly known as: LYRICA Take 150 mg by mouth 3 (three) times daily.   PreviDent 5000 Dry Mouth 1.1 % Gel dental gel Generic drug: sodium fluoride USE THREE TIMES A DAY AFTER MEALS. DO NOT RINSE.    SLS FREE WILL NOT FOAM.   QUEtiapine 25 MG tablet Commonly known as: SEROQUEL Take 1 tablet (25 mg total) by mouth at bedtime.   sildenafil 100 MG tablet Commonly known as: VIAGRA Take 1 tablet (100 mg total) by mouth as needed for erectile dysfunction.   testosterone cypionate 200 MG/ML injection Commonly known as: DEPOTESTOSTERONE CYPIONATE INJECT 1 ML INTO THE MUSCLE EVERY 10 DAYS        Allergies:  Allergies  Allergen Reactions   Nickel Rash    Family History: Family History  Problem Relation Age of Onset   Hypertension Mother    Hyperlipidemia Mother    Hypertension Father    Hyperlipidemia Father    ADD / ADHD Brother    Colon cancer Maternal Grandmother    Prostate cancer Neg Hx    Kidney cancer Neg Hx    Bladder Cancer Neg Hx     Social History:  reports that he has never smoked. His smokeless tobacco use includes chew. He reports current alcohol use. He reports that he does not use drugs.   Physical Exam: BP 136/88   Pulse (!) 125   Ht 6\' 1"  (1.854 m)   Wt 230  lb (  104.3 kg)   SpO2 97%   BMI 30.34 kg/m   Constitutional:  Well nourished. Alert and oriented, No acute distress. HEENT: [place] AT, moist mucus membranes.  Trachea midline Cardiovascular: No clubbing, cyanosis, or edema. Respiratory: Normal respiratory effort, no increased work of breathing. Neurologic: Grossly intact, no focal deficits, moving all 4 extremities. Psychiatric: Normal mood and affect.   Laboratory Data: Results for orders placed or performed in visit on 11/18/21  Hemoglobin and hematocrit, blood  Result Value Ref Range   Hemoglobin 16.7 13.0 - 17.7 g/dL   Hematocrit 09.849.3 11.937.5 - 51.0 %  Testosterone  Result Value Ref Range   Testosterone 334 264 - 916 ng/dL  PSA  Result Value Ref Range   Prostate Specific Ag, Serum 0.8 0.0 - 4.0 ng/mL  I have reviewed the labs.   Pertinent Imaging N/A  Assessment & Plan:    Testosterone deficiency  -testosterone level, H & H and PSA pending -continue testosterone cypionate 200 MG/ML, 1 cc every ten days  2. ED  - continue sidenafil 100 mg, on-demand-dosing  3. Frequency -attributes to water intake  -no other worrisome symptoms    Michiel CowboySHANNON Yonas Bunda, [facility]   [facility] [address], [address] [place], [place] [id number] [phone]

## 2021-11-20 ENCOUNTER — Encounter: Payer: Self-pay | Admitting: Urology

## 2021-11-20 ENCOUNTER — Ambulatory Visit: Payer: Managed Care, Other (non HMO) | Admitting: Urology

## 2021-11-20 VITALS — BP 136/88 | HR 125 | Ht 73.0 in | Wt 230.0 lb

## 2021-11-20 DIAGNOSIS — E291 Testicular hypofunction: Secondary | ICD-10-CM | POA: Diagnosis not present

## 2021-11-20 DIAGNOSIS — E349 Endocrine disorder, unspecified: Secondary | ICD-10-CM

## 2021-11-20 DIAGNOSIS — N529 Male erectile dysfunction, unspecified: Secondary | ICD-10-CM | POA: Diagnosis not present

## 2021-11-20 DIAGNOSIS — R3915 Urgency of urination: Secondary | ICD-10-CM

## 2021-11-20 MED ORDER — "BD DISP NEEDLES 21G X 1-1/2"" MISC": 1.0000 mg | 0 refills | Status: DC

## 2021-11-20 MED ORDER — TESTOSTERONE CYPIONATE 200 MG/ML IM SOLN: INTRAMUSCULAR | 1 refills | Status: DC

## 2021-11-20 MED ORDER — "BD DISP NEEDLES 18G X 1-1/2"" MISC": 1.0000 mg | 0 refills | Status: DC

## 2021-11-20 MED ORDER — SYRINGE 2-3 ML 3 ML MISC: 1.0000 mg | 3 refills | Status: AC

## 2021-11-21 NOTE — Telephone Encounter (Signed)
Left message to return call to our office.  

## 2021-11-26 ENCOUNTER — Encounter: Payer: Self-pay | Admitting: Primary Care

## 2021-11-26 ENCOUNTER — Ambulatory Visit (INDEPENDENT_AMBULATORY_CARE_PROVIDER_SITE_OTHER): Payer: Managed Care, Other (non HMO) | Admitting: Primary Care

## 2021-11-26 VITALS — BP 140/76 | HR 120 | Temp 97.6°F | Ht 73.0 in | Wt 245.0 lb

## 2021-11-26 DIAGNOSIS — I10 Essential (primary) hypertension: Secondary | ICD-10-CM

## 2021-11-26 DIAGNOSIS — F32A Depression, unspecified: Secondary | ICD-10-CM

## 2021-11-26 DIAGNOSIS — Z23 Encounter for immunization: Secondary | ICD-10-CM | POA: Diagnosis not present

## 2021-11-26 DIAGNOSIS — E349 Endocrine disorder, unspecified: Secondary | ICD-10-CM

## 2021-11-26 DIAGNOSIS — Z0000 Encounter for general adult medical examination without abnormal findings: Secondary | ICD-10-CM | POA: Diagnosis not present

## 2021-11-26 DIAGNOSIS — M5440 Lumbago with sciatica, unspecified side: Secondary | ICD-10-CM

## 2021-11-26 DIAGNOSIS — F419 Anxiety disorder, unspecified: Secondary | ICD-10-CM

## 2021-11-26 DIAGNOSIS — M1990 Unspecified osteoarthritis, unspecified site: Secondary | ICD-10-CM

## 2021-11-26 DIAGNOSIS — K760 Fatty (change of) liver, not elsewhere classified: Secondary | ICD-10-CM

## 2021-11-26 DIAGNOSIS — F900 Attention-deficit hyperactivity disorder, predominantly inattentive type: Secondary | ICD-10-CM

## 2021-11-26 DIAGNOSIS — G8929 Other chronic pain: Secondary | ICD-10-CM

## 2021-11-26 MED ORDER — CARVEDILOL 12.5 MG PO TABS: 12.5000 mg | ORAL_TABLET | Freq: Two times a day (BID) | ORAL | 0 refills | Status: DC

## 2021-11-26 MED ORDER — LOSARTAN POTASSIUM 100 MG PO TABS: 100.0000 mg | ORAL_TABLET | Freq: Every day | ORAL | 3 refills | Status: DC

## 2021-11-26 NOTE — Progress Notes (Signed)
Subjective:    Patient ID: Darren Mason, male    DOB: [birth year], 41 y.o.   MRN: [id number]  HPI  Darren Mason is a very pleasant 41 y.o. male who presents today for complete physical and follow up of chronic conditions.  Immunizations: -Tetanus: Unknown.  -Influenza: Did complete last season  -Covid-19: 2 vaccines   Diet: Fair diet.  Exercise: No regular exercise.  Eye exam: Completed a few years ago. Dental exam: Completes semi-annually.   BP Readings from Last 3 Encounters:  11/26/21 140/76  11/20/21 136/88  05/22/21 (!) 153/95   He is checking his BP at home which is running 120's-130's/70's-90's. He is undergoing a lot of personal stress with his family. He has been out of carvedilol for a few weeks. He is compliant to his losartan 100 mg.     Review of Systems  Constitutional:  Negative for unexpected weight change.  HENT:  Negative for rhinorrhea.   Respiratory:  Negative for cough and shortness of breath.   Cardiovascular:  Negative for chest pain.  Gastrointestinal:  Negative for constipation and diarrhea.  Genitourinary:  Negative for difficulty urinating.  Musculoskeletal:  Positive for arthralgias and back pain.  Skin:  Negative for rash.  Allergic/Immunologic: Negative for environmental allergies.  Neurological:  Negative for dizziness and headaches.  Psychiatric/Behavioral:  The patient is nervous/anxious.         Past Medical History:  Diagnosis Date   ADHD    Anxiety and depression    Chronic back pain    Osteoarthritis    Serotonin syndrome    Sleep apnea    CPAP machine    Testosterone deficiency     Social History   Socioeconomic History   Marital status: Married    Spouse name: Not on file   Number of children: 1   Years of education: Not on file   Highest education level: Not on file  Occupational History   Not on file  Tobacco Use   Smoking status: Never   Smokeless tobacco: Current    Types: Chew  Vaping Use   Vaping  Use: Never used  Substance and Sexual Activity   Alcohol use: Yes    Comment: occasionally   Drug use: No   Sexual activity: Yes    Comment: wife gats injection  Other Topics Concern   Not on file  Social History Narrative   On disability from [employer].   Social Determinants of Health   Financial Resource Strain: Not on file  Food Insecurity: Not on file  Transportation Needs: Not on file  Physical Activity: Not on file  Stress: Not on file  Social Connections: Not on file  Intimate Partner Violence: Not on file    Past Surgical History:  Procedure Laterality Date   APPENDECTOMY  2001   BACK SURGERY  05/2017   BACK SURGERY  04/06/2020   stemulator placed     Family History  Problem Relation Age of Onset   Hypertension Mother    Hyperlipidemia Mother    Hypertension Father    Hyperlipidemia Father    ADD / ADHD Brother    Colon cancer Maternal Grandmother    Prostate cancer Neg Hx    Kidney cancer Neg Hx    Bladder Cancer Neg Hx     Allergies  Allergen Reactions   Nickel Rash    Current Outpatient Medications on File Prior to Visit  Medication Sig Dispense Refill   buPROPion (WELLBUTRIN XL)  300 MG 24 hr tablet Take 1 tablet (300 mg total) by mouth daily. 90 tablet 1   carisoprodol (SOMA) 350 MG tablet Take 350 mg by mouth 3 (three) times daily as needed.     lisdexamfetamine (VYVANSE) 70 MG capsule Take 1 capsule (70 mg total) by mouth daily before breakfast. 30 capsule 0   NEEDLE, DISP, 18 G (BD DISP NEEDLES) 18G X 1-1/2" MISC 1 mg by Does not apply route every 14 (fourteen) days. 50 each 0   NEEDLE, DISP, 21 G (BD DISP NEEDLES) 21G X 1-1/2" MISC 1 mg by Does not apply route every 14 (fourteen) days. 50 each 0   oxyCODONE-acetaminophen (PERCOCET) 10-325 MG tablet Take 1 tablet by mouth every 4 (four) hours as needed for pain. Up to 6 tablets PRN     pregabalin (LYRICA) 150 MG capsule Take 150 mg by mouth 3 (three) times daily.     PREVIDENT  5000 DRY MOUTH 1.1 % GEL dental gel USE THREE TIMES A DAY AFTER MEALS. DO NOT RINSE.    SLS FREE WILL NOT FOAM.     QUEtiapine (SEROQUEL) 25 MG tablet Take 1 tablet (25 mg total) by mouth at bedtime. 30 tablet 1   sildenafil (VIAGRA) 100 MG tablet Take 1 tablet (100 mg total) by mouth as needed for erectile dysfunction. 30 tablet 3   Syringe, Disposable, (2-3CC SYRINGE) 3 ML MISC 1 mg by Does not apply route every 14 (fourteen) days. 25 each 3   testosterone cypionate (DEPOTESTOSTERONE CYPIONATE) 200 MG/ML injection INJECT 1 ML INTO THE MUSCLE EVERY 10 DAYS 3 mL 1   No current facility-administered medications on file prior to visit.    BP 140/76   Pulse (!) 120   Temp 97.6 F (36.4 C) (Oral)   Ht 6\' 1"  (1.854 m)   Wt 245 lb (111.1 kg)   SpO2 97%   BMI 32.32 kg/m  Objective:   Physical Exam HENT:     Right Ear: Tympanic membrane and ear canal normal.     Left Ear: Tympanic membrane and ear canal normal.     Nose: Nose normal.     Right Sinus: No maxillary sinus tenderness or frontal sinus tenderness.     Left Sinus: No maxillary sinus tenderness or frontal sinus tenderness.  Eyes:     Conjunctiva/sclera: Conjunctivae normal.  Neck:     Thyroid: No thyromegaly.     Vascular: No carotid bruit.  Cardiovascular:     Rate and Rhythm: Normal rate and regular rhythm.     Heart sounds: Normal heart sounds.  Pulmonary:     Effort: Pulmonary effort is normal.     Breath sounds: Normal breath sounds. No wheezing or rales.  Abdominal:     General: Bowel sounds are normal.     Palpations: Abdomen is soft.     Tenderness: There is no abdominal tenderness.  Musculoskeletal:        General: Normal range of motion.     Cervical back: Neck supple.  Skin:    General: Skin is warm and dry.  Neurological:     Mental Status: He is alert and oriented to person, place, and time.     Cranial Nerves: No cranial nerve deficit.     Deep Tendon Reflexes: Reflexes are normal and symmetric.   Psychiatric:        Mood and Affect: Mood normal.          Assessment & Plan:

## 2021-11-26 NOTE — Assessment & Plan Note (Signed)
Recent labs drawn per rheumatology. Patient will drop off labs.

## 2021-11-26 NOTE — Assessment & Plan Note (Signed)
Following with Urology. Office notes and labs from May 2023 reviewed.   Continue testosterone 200 mg every 10 days.

## 2021-11-26 NOTE — Assessment & Plan Note (Signed)
Following with rheumatology and pain management. He will bring labs from recent rheumatology visit.

## 2021-11-26 NOTE — Assessment & Plan Note (Signed)
Following with pain management.  Continue Soma 350 mg TID, Lyrica 75 mg three times daily, and Percocet 10-325 mg every 4 hours PRN.  Will request notes.

## 2021-11-26 NOTE — Patient Instructions (Signed)
Please provide me with a copy of your labs.  Follow up with cardiology.   It was a pleasure to see you today!  Preventive Care 61-41 Years Old, Male Preventive care refers to lifestyle choices and visits with your health care provider that can promote health and wellness. Preventive care visits are also called wellness exams. What can I expect for my preventive care visit? Counseling During your preventive care visit, your health care provider may ask about your: Medical history, including: Past medical problems. Family medical history. Current health, including: Emotional well-being. Home life and relationship well-being. Sexual activity. Lifestyle, including: Alcohol, nicotine or tobacco, and drug use. Access to firearms. Diet, exercise, and sleep habits. Safety issues such as seatbelt and bike helmet use. Sunscreen use. Work and work Astronomer. Physical exam Your health care provider will check your: Height and weight. These may be used to calculate your BMI (body mass index). BMI is a measurement that tells if you are at a healthy weight. Waist circumference. This measures the distance around your waistline. This measurement also tells if you are at a healthy weight and may help predict your risk of certain diseases, such as type 2 diabetes and high blood pressure. Heart rate and blood pressure. Body temperature. Skin for abnormal spots. What immunizations do I need?  Vaccines are usually given at various ages, according to a schedule. Your health care provider will recommend vaccines for you based on your age, medical history, and lifestyle or other factors, such as travel or where you work. What tests do I need? Screening Your health care provider may recommend screening tests for certain conditions. This may include: Lipid and cholesterol levels. Diabetes screening. This is done by checking your blood sugar (glucose) after you have not eaten for a while  (fasting). Hepatitis B test. Hepatitis C test. HIV (human immunodeficiency virus) test. STI (sexually transmitted infection) testing, if you are at risk. Lung cancer screening. Prostate cancer screening. Colorectal cancer screening. Talk with your health care provider about your test results, treatment options, and if necessary, the need for more tests. Follow these instructions at home: Eating and drinking  Eat a diet that includes fresh fruits and vegetables, whole grains, lean protein, and low-fat dairy products. Take vitamin and mineral supplements as recommended by your health care provider. Do not drink alcohol if your health care provider tells you not to drink. If you drink alcohol: Limit how much you have to 0-2 drinks a day. Know how much alcohol is in your drink. In the [place] one drink equals one 12 oz bottle of beer (355 mL), one 5 oz glass of wine (148 mL), or one 1 oz glass of hard liquor (44 mL). Lifestyle Brush your teeth every morning and night with fluoride toothpaste. Floss one time each day. Exercise for at least 30 minutes 5 or more days each week. Do not use any products that contain nicotine or tobacco. These products include cigarettes, chewing tobacco, and vaping devices, such as e-cigarettes. If you need help quitting, ask your health care provider. Do not use drugs. If you are sexually active, practice safe sex. Use a condom or other form of protection to prevent STIs. Take aspirin only as told by your health care provider. Make sure that you understand how much to take and what form to take. Work with your health care provider to find out whether it is safe and beneficial for you to take aspirin daily. Find healthy ways to manage stress, such as:  Meditation, yoga, or listening to music. Journaling. Talking to a trusted person. Spending time with friends and family. Minimize exposure to UV radiation to reduce your risk of skin cancer. Safety Always wear  your seat belt while driving or riding in a vehicle. Do not drive: If you have been drinking alcohol. Do not ride with someone who has been drinking. When you are tired or distracted. While texting. If you have been using any mind-altering substances or drugs. Wear a helmet and other protective equipment during sports activities. If you have firearms in your house, make sure you follow all gun safety procedures. What's next? Go to your health care provider once a year for an annual wellness visit. Ask your health care provider how often you should have your eyes and teeth checked. Stay up to date on all vaccines. This information is not intended to replace advice given to you by your health care provider. Make sure you discuss any questions you have with your health care provider. Document Revised: 12/19/2020 Document Reviewed: 12/19/2020 Elsevier Patient Education  2023 [employer].

## 2021-11-26 NOTE — Assessment & Plan Note (Signed)
Above goal today, home readings are better. He has been out of carvedilol for weeks.  Continue losartan 100 mg daily and carvedilol 12.5 mg BID.  CMP pending.

## 2021-11-26 NOTE — Assessment & Plan Note (Signed)
Following with psychiatry.  Continue Seroquel 25 mg HS, Wellbutrin XL 300 mg daily.

## 2021-11-26 NOTE — Assessment & Plan Note (Signed)
Following with psychiatry. Continue Vyvanse 70 mg daily.

## 2021-11-26 NOTE — Addendum Note (Signed)
Addended by: Consuella Lose on: 11/26/2021 01:23 PM   Modules accepted: Orders

## 2021-11-26 NOTE — Assessment & Plan Note (Signed)
Tetanus due, provided today.  Discussed the importance of a healthy diet and regular exercise in order for weight loss, and to reduce the risk of further co-morbidity.  Exam today stable. Labs reviewed. He completed labs per rheumatology last month and will drop off a copy.

## 2021-12-03 ENCOUNTER — Other Ambulatory Visit: Payer: Self-pay | Admitting: Psychiatry
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
## 2021-12-16 ENCOUNTER — Ambulatory Visit (INDEPENDENT_AMBULATORY_CARE_PROVIDER_SITE_OTHER): Payer: 59 | Admitting: Psychiatry

## 2021-12-16 ENCOUNTER — Encounter: Payer: Self-pay | Admitting: Psychiatry

## 2021-12-16 VITALS — BP 160/95 | HR 90 | Temp 98.9°F | Wt 233.6 lb

## 2021-12-16 DIAGNOSIS — F900 Attention-deficit hyperactivity disorder, predominantly inattentive type: Secondary | ICD-10-CM

## 2021-12-16 DIAGNOSIS — F331 Major depressive disorder, recurrent, moderate: Secondary | ICD-10-CM

## 2021-12-16 MED ORDER — LISDEXAMFETAMINE DIMESYLATE 70 MG PO CAPS: 70.0000 mg | ORAL_CAPSULE | Freq: Every day | ORAL | 0 refills | Status: DC

## 2021-12-16 MED ORDER — QUETIAPINE FUMARATE 50 MG PO TABS: 50.0000 mg | ORAL_TABLET | Freq: Every day | ORAL | 1 refills | Status: DC

## 2022-01-08 ENCOUNTER — Other Ambulatory Visit: Payer: Self-pay | Admitting: Psychiatry

## 2022-01-11 ENCOUNTER — Other Ambulatory Visit: Payer: Self-pay | Admitting: Psychiatry

## 2022-01-11 DIAGNOSIS — F330 Major depressive disorder, recurrent, mild: Secondary | ICD-10-CM

## 2022-01-21 ENCOUNTER — Other Ambulatory Visit: Payer: Self-pay | Admitting: Urology

## 2022-01-21 DIAGNOSIS — E349 Endocrine disorder, unspecified: Secondary | ICD-10-CM
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
## 2022-01-22 ENCOUNTER — Other Ambulatory Visit: Payer: Self-pay | Admitting: Psychiatry

## 2022-01-22 DIAGNOSIS — F330 Major depressive disorder, recurrent, mild: Secondary | ICD-10-CM

## 2022-01-23 ENCOUNTER — Other Ambulatory Visit: Payer: Self-pay | Admitting: Psychiatry

## 2022-01-23 ENCOUNTER — Encounter: Payer: Self-pay | Admitting: Psychiatry

## 2022-01-23 ENCOUNTER — Ambulatory Visit: Payer: 59 | Admitting: Psychiatry

## 2022-01-23 ENCOUNTER — Telehealth: Payer: Self-pay

## 2022-01-23 VITALS — BP 161/92 | HR 88 | Temp 98.5°F | Wt 237.0 lb

## 2022-01-23 DIAGNOSIS — F900 Attention-deficit hyperactivity disorder, predominantly inattentive type: Secondary | ICD-10-CM

## 2022-01-23 DIAGNOSIS — F331 Major depressive disorder, recurrent, moderate: Secondary | ICD-10-CM | POA: Diagnosis not present

## 2022-01-23 MED ORDER — LISDEXAMFETAMINE DIMESYLATE 70 MG PO CAPS: 70.0000 mg | ORAL_CAPSULE | Freq: Every day | ORAL | 0 refills | Status: DC

## 2022-01-23 MED ORDER — BUPROPION HCL ER (XL) 300 MG PO TB24: 300.0000 mg | ORAL_TABLET | Freq: Every day | ORAL | 1 refills | Status: DC

## 2022-01-23 MED ORDER — QUETIAPINE FUMARATE 50 MG PO TABS: 50.0000 mg | ORAL_TABLET | Freq: Every day | ORAL | 0 refills | Status: DC

## 2022-01-23 NOTE — Telephone Encounter (Signed)
pt called states that he needs the vyvanse sent to [facility] in [place] .  they have in stock

## 2022-01-23 NOTE — Telephone Encounter (Signed)
Ordered. Could you contact [facility] to cancel the vyvanse order? Thanks.

## 2022-01-23 NOTE — Patient Instructions (Signed)
Continue bupropion 300 mg daily  Continue quetiapine 50 mg at night  Continue Vyvanse 70 mg daily Next appointment-  9/14 at 11 AM

## 2022-01-24 NOTE — Telephone Encounter (Signed)
Pharmacy called and left a message.

## 2022-02-09 ENCOUNTER — Other Ambulatory Visit: Payer: Self-pay | Admitting: Psychiatry

## 2022-02-14 ENCOUNTER — Other Ambulatory Visit: Payer: Self-pay | Admitting: Psychiatry

## 2022-02-21 ENCOUNTER — Telehealth: Payer: Self-pay

## 2022-02-21 ENCOUNTER — Other Ambulatory Visit: Payer: Self-pay | Admitting: Psychiatry

## 2022-02-21 MED ORDER — METHYLPHENIDATE HCL ER (OSM) 36 MG PO TBCR: 36.0000 mg | EXTENDED_RELEASE_TABLET | Freq: Every day | ORAL | 0 refills | Status: DC

## 2022-02-21 NOTE — Telephone Encounter (Signed)
Ordered concerta 36 mg daily. Please advise him to try at least 5 days unless he has any side effects, and contact us if he has limited benefit from this.   I have utilized the [place] Controlled Substances Reporting System (PMP AWARxE) to confirm adherence regarding the patient's medication. My review reveals appropriate prescription fills.

## 2022-02-21 NOTE — Telephone Encounter (Signed)
pt called left message that the pharmacy did not have the vyvanse but they do have concerta. can he get that sent to the [facility]?

## 2022-02-21 NOTE — Telephone Encounter (Signed)
spoke with patient gave notice that rx was sent in.

## 2022-03-05 ENCOUNTER — Other Ambulatory Visit (HOSPITAL_COMMUNITY): Payer: Self-pay | Admitting: Rehabilitation

## 2022-03-05 ENCOUNTER — Telehealth (HOSPITAL_BASED_OUTPATIENT_CLINIC_OR_DEPARTMENT_OTHER): Payer: Self-pay

## 2022-03-05 ENCOUNTER — Other Ambulatory Visit: Payer: Self-pay | Admitting: Rehabilitation

## 2022-03-05 DIAGNOSIS — M5116 Intervertebral disc disorders with radiculopathy, lumbar region: Secondary | ICD-10-CM

## 2022-03-09 ENCOUNTER — Other Ambulatory Visit: Payer: Self-pay | Admitting: Urology

## 2022-03-09 DIAGNOSIS — N529 Male erectile dysfunction, unspecified: Secondary | ICD-10-CM

## 2022-03-13 ENCOUNTER — Other Ambulatory Visit: Payer: Self-pay | Admitting: Primary Care

## 2022-03-13 DIAGNOSIS — I10 Essential (primary) hypertension: Secondary | ICD-10-CM

## 2022-03-18 ENCOUNTER — Other Ambulatory Visit: Payer: Self-pay | Admitting: Urology

## 2022-03-18 DIAGNOSIS — E349 Endocrine disorder, unspecified: Secondary | ICD-10-CM
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
## 2022-03-20 ENCOUNTER — Ambulatory Visit (INDEPENDENT_AMBULATORY_CARE_PROVIDER_SITE_OTHER): Payer: 59 | Admitting: Psychiatry

## 2022-03-20 ENCOUNTER — Encounter: Payer: Self-pay | Admitting: Psychiatry

## 2022-03-20 VITALS — BP 139/92 | HR 80 | Temp 82.0°F | Ht 73.0 in | Wt 229.0 lb

## 2022-03-20 DIAGNOSIS — F331 Major depressive disorder, recurrent, moderate: Secondary | ICD-10-CM | POA: Diagnosis not present

## 2022-03-20 DIAGNOSIS — F900 Attention-deficit hyperactivity disorder, predominantly inattentive type: Secondary | ICD-10-CM | POA: Diagnosis not present

## 2022-03-20 MED ORDER — METHYLPHENIDATE HCL ER (OSM) 54 MG PO TBCR: 54.0000 mg | EXTENDED_RELEASE_TABLET | Freq: Every day | ORAL | 0 refills | Status: DC

## 2022-03-25 ENCOUNTER — Other Ambulatory Visit (HOSPITAL_COMMUNITY): Payer: Self-pay | Admitting: Surgery

## 2022-03-25 DIAGNOSIS — M5116 Intervertebral disc disorders with radiculopathy, lumbar region: Secondary | ICD-10-CM

## 2022-03-29 IMAGING — MR MR THORACIC SPINE W/O CM
4 of 7 series · 19 of 48 positions shown · non-contrast
Comparison: None.

CLINICAL DATA: Chronic pain syndrome.

EXAM:
MRI THORACIC SPINE WITHOUT CONTRAST
TECHNIQUE: Multiplanar, multisequence MR imaging of the thoracic spine was
performed. No intravenous contrast was administered.

[Series 2: T1 · sagittal · 3.0mm · 0.90mm/px · 3 of 12 slices shown (1 of 2)]
[im 1/12]
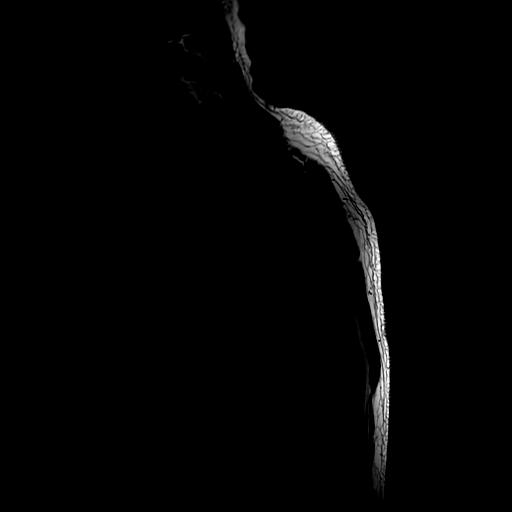
[im 6/12]
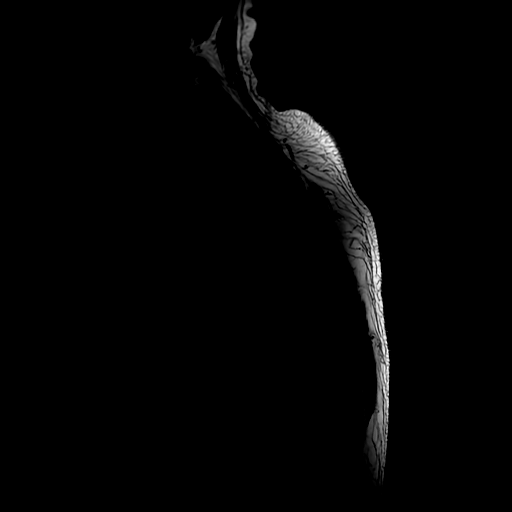
[im 12/12]
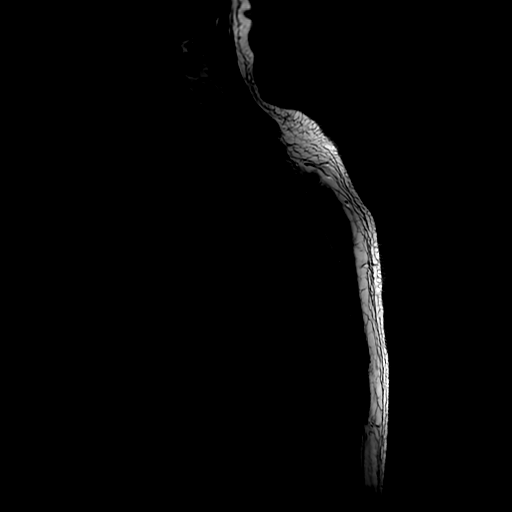

[Series 5: T1 · sagittal · 3.0mm · 0.66mm/px · 3 of 16 slices shown (2 of 2)]
[im 1/16]
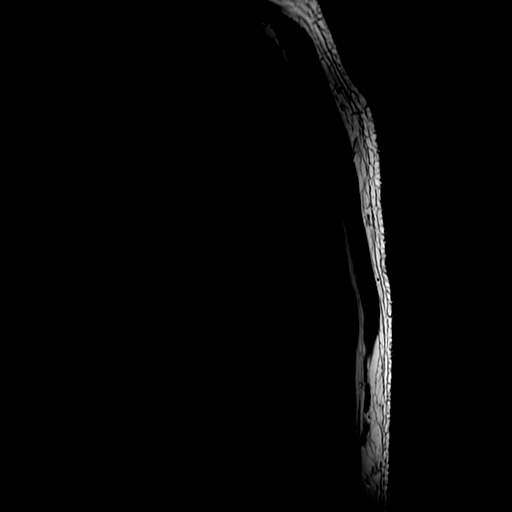
[im 11/16]
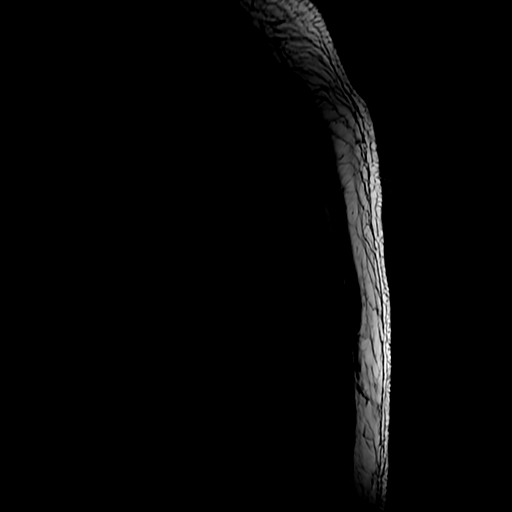
[im 16/16]
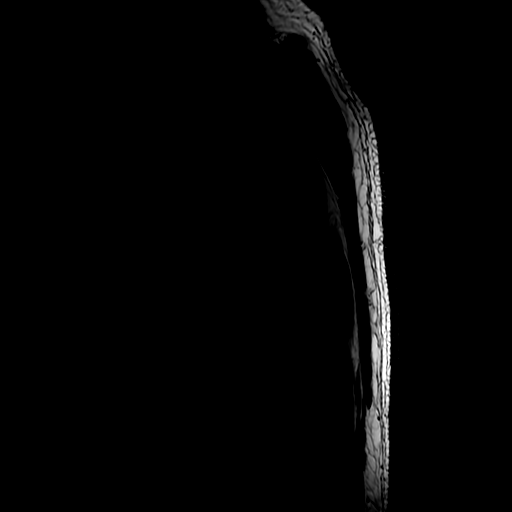

[Series 6: T2 · axial · 4.0mm · 0.39mm/px · z∈[-178,+46]mm · 10 of 41 slices shown (1 of 2)]
[im 1/41]
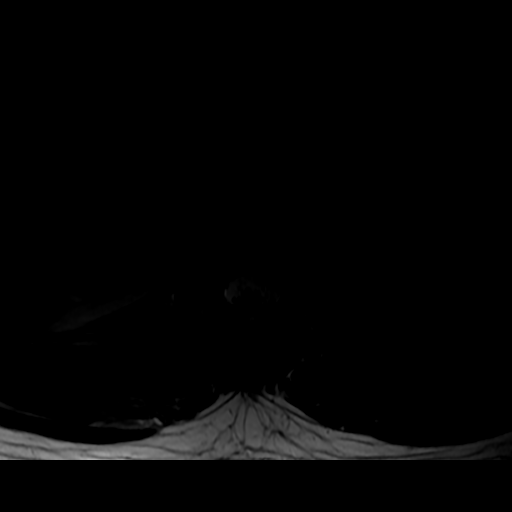
[im 5/41]
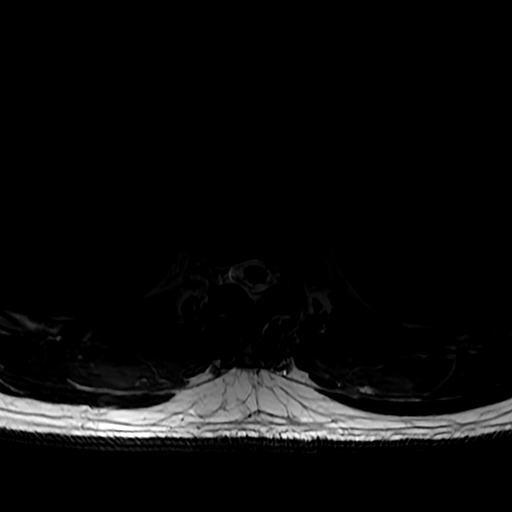
[im 9/41]
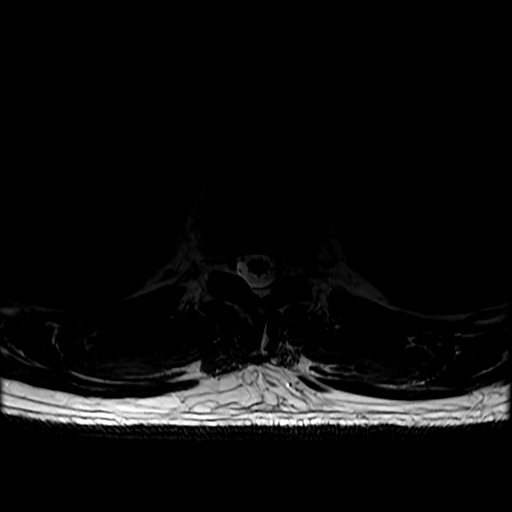
[im 13/41]
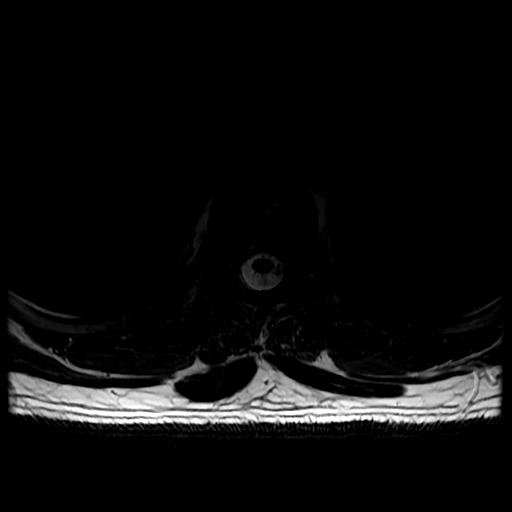
[im 17/41]
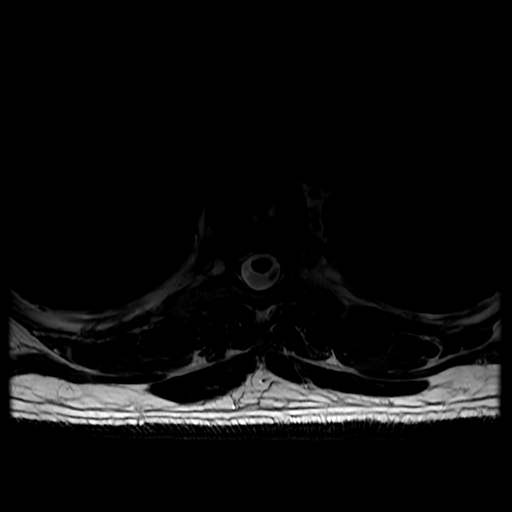
[im 21/41]
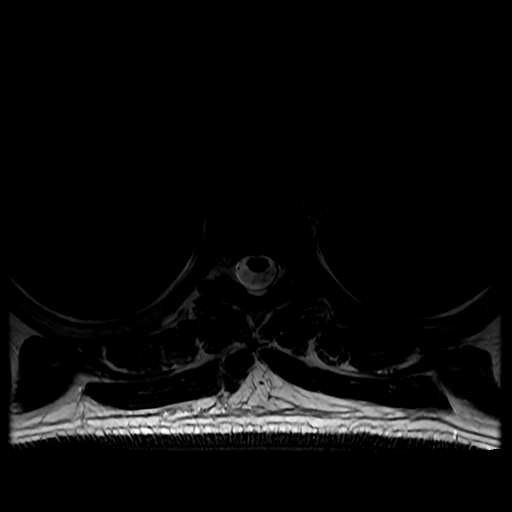
[im 25/41]
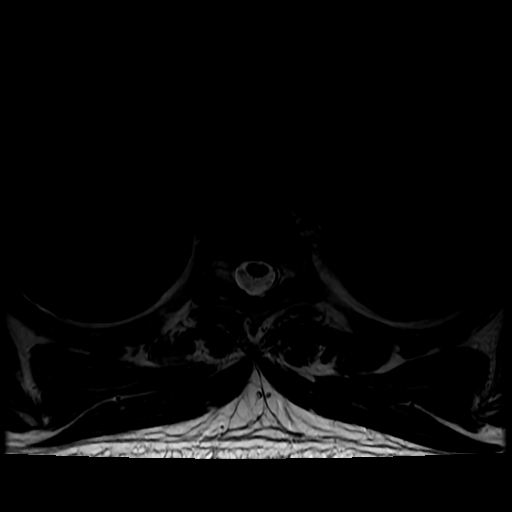
[im 29/41]
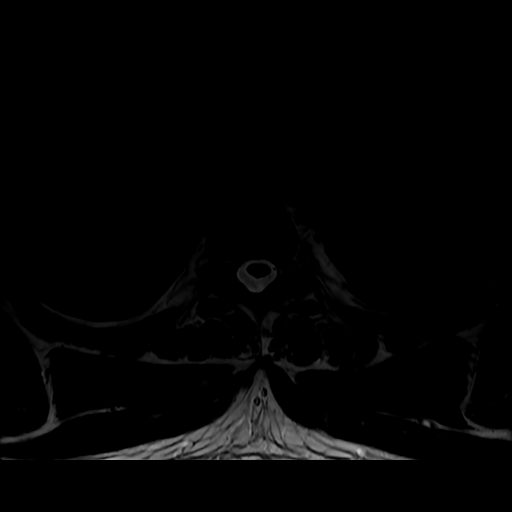
[im 33/41]
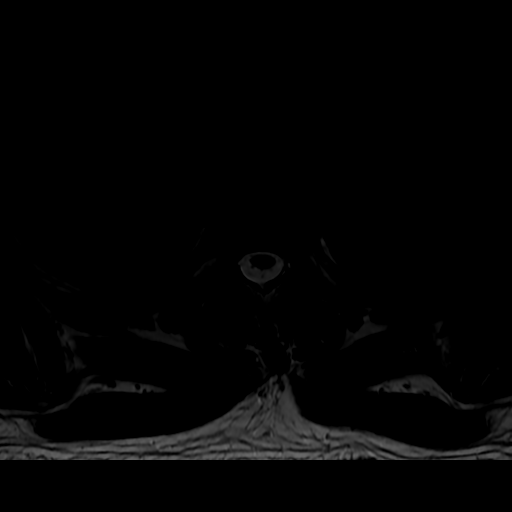
[im 37/41]
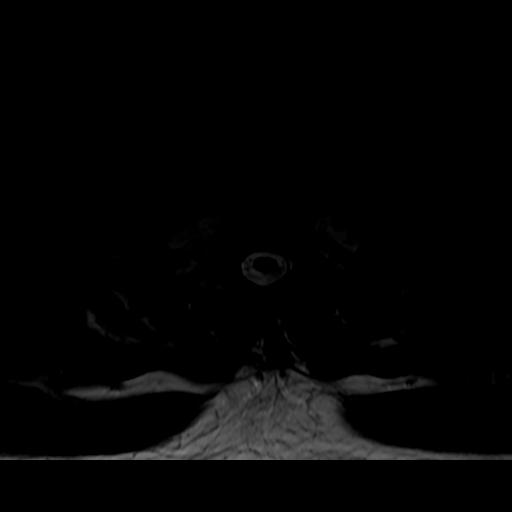

[Series 8: T2 · sagittal · 3.0mm · 0.68mm/px · 3 of 16 slices shown (2 of 2)]
[im 1/16]
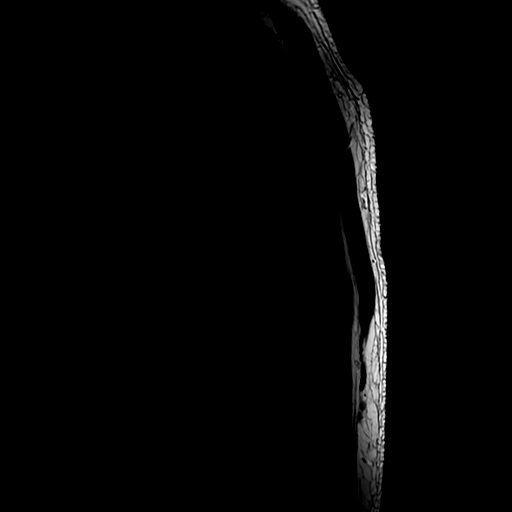
[im 11/16]
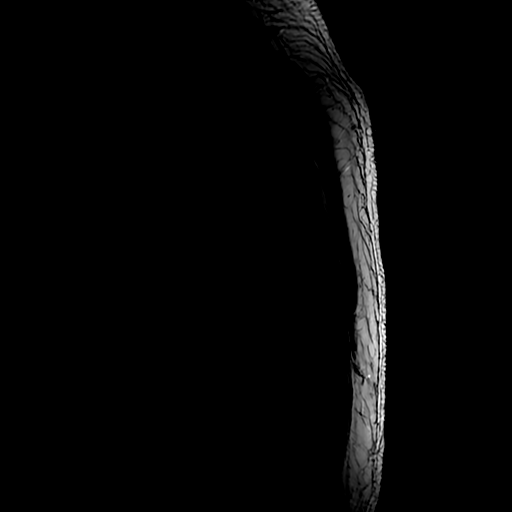
[im 16/16]
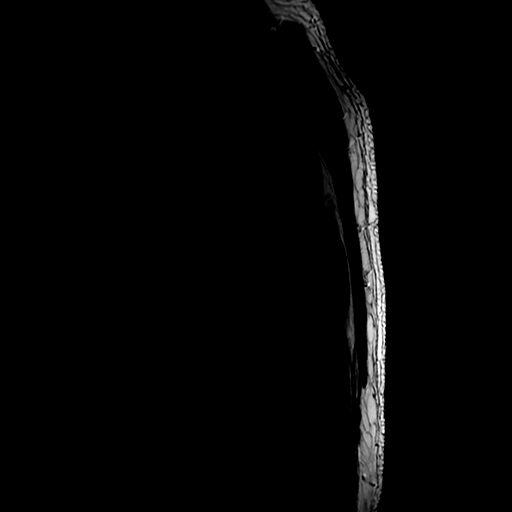

[19 of 48 positions shown; findings below may reference images not displayed]

FINDINGS: Alignment:  Physiologic.

Vertebrae: No fracture, evidence of discitis, or bone lesion.

Cord: Mild flattening of the air right anterior contour of the cord
at T8-9, without cord signal abnormality.

Paraspinal and other soft tissues: Negative.

Disc levels:

At T8-9, there is a small right paracentral disc protrusion causing
mild flattening of the right anterior contour of the cord without
cord signal abnormality. No spinal canal or neural foraminal
stenosis.

No significant disc bulge or herniation, spinal canal or neural
foraminal stenosis at any of the levels.
IMPRESSION: Small right paracentral disc protrusion at T8-9 causing mild
flattening of the right anterior contour of the cord without cord
signal abnormality. Otherwise unremarkable MRI of the thoracic
spine.

## 2022-04-30 ENCOUNTER — Other Ambulatory Visit: Payer: Self-pay | Admitting: Psychiatry
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
## 2022-05-16 ENCOUNTER — Other Ambulatory Visit: Payer: Managed Care, Other (non HMO)

## 2022-05-16 DIAGNOSIS — E349 Endocrine disorder, unspecified: Secondary | ICD-10-CM

## 2022-05-17 LAB — HEMOGLOBIN AND HEMATOCRIT, BLOOD
Hematocrit: 55.6 % — ABNORMAL HIGH (ref 37.5–51.0)
Hemoglobin: 18.5 g/dL — ABNORMAL HIGH (ref 13.0–17.7)

## 2022-05-17 LAB — PSA: Prostate Specific Ag, Serum: 8.5 ng/mL — ABNORMAL HIGH (ref 0.0–4.0)

## 2022-05-17 LAB — TESTOSTERONE: Testosterone: 404 ng/dL (ref 264–916)

## 2022-05-19 ENCOUNTER — Ambulatory Visit: Payer: 59 | Admitting: Psychiatry

## 2022-05-19 ENCOUNTER — Telehealth: Payer: Self-pay

## 2022-05-19 ENCOUNTER — Encounter: Payer: Self-pay | Admitting: Psychiatry

## 2022-05-19 VITALS — BP 137/83 | HR 73 | Temp 97.9°F | Ht 73.0 in | Wt 233.0 lb

## 2022-05-19 DIAGNOSIS — F331 Major depressive disorder, recurrent, moderate: Secondary | ICD-10-CM | POA: Diagnosis not present

## 2022-05-19 DIAGNOSIS — F900 Attention-deficit hyperactivity disorder, predominantly inattentive type: Secondary | ICD-10-CM

## 2022-05-19 MED ORDER — METHYLPHENIDATE HCL ER (OSM) 54 MG PO TBCR: 54.0000 mg | EXTENDED_RELEASE_TABLET | Freq: Every day | ORAL | 0 refills | Status: DC

## 2022-05-19 MED ORDER — QUETIAPINE FUMARATE 50 MG PO TABS: 50.0000 mg | ORAL_TABLET | Freq: Every day | ORAL | 1 refills | Status: DC

## 2022-05-19 NOTE — Patient Instructions (Signed)
Continue bupropion 300 mg daily  Continue quetiapine 50 mg at night Continue Concerta 54 mg daily- Obtain Urine test Next appointment-  1/11 at 9:30

## 2022-05-19 NOTE — Telephone Encounter (Signed)
-----   Message from [name], PA-C sent at 05/18/2022  4:38 PM EST ----- Please let Darren Mason know that his PSA, hematocrit and hemoglobin are all elevated.  He need to stop his testosterone injections immediately.  We will discuss the next steps at his visit on the 15th.

## 2022-05-19 NOTE — Telephone Encounter (Signed)
Notified pt as advised, pt expressed understanding and confirmed his appt for Wednesday 11/15.

## 2022-05-20 NOTE — Progress Notes (Unsigned)
05/21/22 4:42 PM   Darren Mason 19-May-1981 [id number]  Referring provider:  Doreene Nest, NP [address] [place],  [place] [address]  Chief Complaint  Patient presents with   Erectile Dysfunction   Elevated PSA   Urological history: 1. Testosterone deficiency -contributing factors of age and obesity -testosterone (05/2022) 404 -Hemoglobin/hematocrit (05/2022) 18.5/55.6 -testosterone cypionate 200 mg/mL, 1 cc every 10 days     2. ED -contributing factors of age, depression, anxiety, HTN  -SHIM 24 -sildenafil 100 mg on demand dosing   3. BPH w/LUTS - PSA (05/2022) 8.5 - I PSS 14/2 - PVR 14 mL - advised to have cysto, but patient cancelled   4. Sleep apnea -compliant with CPAP machine   HPI:  Darren Mason is a 41 y.o.male who presents today for 6 month follow up.    Patient was seen by his endocrinologist in [place] 1 month ago and the urine was sent for culture and it grew out Klebsiella pneumonia.  He was prescribed Cipro, but he only took 3 days of it after reading the side effect profile.  He states he was not having any urinary symptoms at the time the urine was collected and sent for culture.  Urine was positive for pyuria and microscopic hematuria.  He states he has donated plasma in the past on numerous occasions, but he states its been over a year since he has donated.  He has no urinary complaints at this time.  Patient denies any modifying or aggravating factors.  Patient denies any gross hematuria, dysuria or suprapubic/flank pain.  Patient denies any fevers, chills, nausea or vomiting.    I PSS 14/2  PVR 14 mL  UA yellow clear, specific gravity 1.010, pH 6.0, 0-5 WBCs, 0-2 RBCs, 0-10 epithelial cells, and amorphous crystals present, mucus threads present and moderate bacteria.   IPSS     Row Name 05/21/22 1500         International Prostate Symptom Score   How often have you had the sensation of not emptying your bladder? Less than half the  time     How often have you had to urinate less than every two hours? Less than half the time     How often have you found you stopped and started again several times when you urinated? Less than half the time     How often have you found it difficult to postpone urination? Less than half the time     How often have you had a weak urinary stream? Less than half the time     How often have you had to strain to start urination? Less than half the time     How many times did you typically get up at night to urinate? 2 Times     Total IPSS Score 14       Quality of Life due to urinary symptoms   If you were to spend the rest of your life with your urinary condition just the way it is now how would you feel about that? Mostly Satisfied               Score:  1-7 Mild 8-19 Moderate 20-35 Severe  SHIM 24.  Patient still having spontaneous erections.  He denies any pain or curvature with erections.     SHIM     Row Name 05/21/22 1549         SHIM: Over the last 6 months:   How do  you rate your confidence that you could get and keep an erection? High     When you had erections with sexual stimulation, how often were your erections hard enough for penetration (entering your partner)? Almost Always or Always     During sexual intercourse, how often were you able to maintain your erection after you had penetrated (entered) your partner? Almost Always or Always     During sexual intercourse, how difficult was it to maintain your erection to completion of intercourse? Not Difficult     When you attempted sexual intercourse, how often was it satisfactory for you? Almost Always or Always       SHIM Total Score   SHIM 24              Score: 1-7 Severe ED 8-11 Moderate ED 12-16 Mild-Moderate ED 17-21 Mild ED 22-25 No ED       PMH: Past Medical History:  Diagnosis Date   ADHD    Anxiety and depression    Chronic back pain    Osteoarthritis    Serotonin syndrome    Sleep  apnea    CPAP machine    Testosterone deficiency     Surgical History: Past Surgical History:  Procedure Laterality Date   APPENDECTOMY  2001   BACK SURGERY  05/2017   BACK SURGERY  04/06/2020   stemulator placed     Home Medications:  Allergies as of 05/21/2022       Reactions   Nickel Rash        Medication List        Accurate as of May 21, 2022  4:42 PM. If you have any questions, ask your nurse or doctor.          2-3CC SYRINGE 3 ML Misc 1 mg by Does not apply route every 14 (fourteen) days.   BD Disp Needles 18G X 1-1/2" Misc Generic drug: NEEDLE (DISP) 18 G 1 mg by Does not apply route every 14 (fourteen) days.   BD Disp Needles 21G X 1-1/2" Misc Generic drug: NEEDLE (DISP) 21 G 1 mg by Does not apply route every 14 (fourteen) days.   buPROPion 300 MG 24 hr tablet Commonly known as: WELLBUTRIN XL Take 1 tablet (300 mg total) by mouth daily.   carisoprodol 350 MG tablet Commonly known as: SOMA Take 350 mg by mouth 3 (three) times daily as needed.   carvedilol 12.5 MG tablet Commonly known as: COREG TAKE 1 TABLET(12.5 MG) BY MOUTH TWICE DAILY FOR BLOOD PRESSURE   losartan 100 MG tablet Commonly known as: COZAAR Take 1 tablet (100 mg total) by mouth daily. for blood pressure.   methylphenidate 54 MG CR tablet Commonly known as: Concerta Take 1 tablet (54 mg total) by mouth daily before breakfast.   methylphenidate 54 MG CR tablet Commonly known as: Concerta Take 1 tablet (54 mg total) by mouth daily before breakfast. Start taking on: June 18, 2022   oxyCODONE-acetaminophen 10-325 MG tablet Commonly known as: PERCOCET Take 1 tablet by mouth every 4 (four) hours as needed for pain. Up to 6 tablets PRN   pregabalin 150 MG capsule Commonly known as: LYRICA Take 150 mg by mouth 3 (three) times daily.   PreviDent 5000 Dry Mouth 1.1 % Gel dental gel Generic drug: sodium fluoride USE THREE TIMES A DAY AFTER MEALS. DO NOT RINSE.     SLS FREE WILL NOT FOAM.   QUEtiapine 50 MG tablet Commonly known as: SEROQUEL Take 1  tablet (50 mg total) by mouth at bedtime.   sildenafil 100 MG tablet Commonly known as: VIAGRA TAKE 1 TABLET AS NEEDED FOR ERECTILE DYSFUNCTION   sulfamethoxazole-trimethoprim 800-160 MG tablet Commonly known as: BACTRIM DS Take 1 tablet by mouth every 12 (twelve) hours.   testosterone cypionate 200 MG/ML injection Commonly known as: DEPOTESTOSTERONE CYPIONATE INJECT 1 ML INTO THE MUSCLE EVERY 10 DAYS.        Allergies:  Allergies  Allergen Reactions   Nickel Rash    Family History: Family History  Problem Relation Age of Onset   Hypertension Mother    Hyperlipidemia Mother    Hypertension Father    Hyperlipidemia Father    ADD / ADHD Brother    Colon cancer Maternal Grandmother    Prostate cancer Neg Hx    Kidney cancer Neg Hx    Bladder Cancer Neg Hx     Social History:  reports that he has never smoked. His smokeless tobacco use includes chew. He reports current alcohol use. He reports that he does not use drugs.   Physical Exam: BP (!) 135/93   Pulse (!) 102   Ht 6\' 1"  (1.854 m)   Wt 220 lb (99.8 kg)   BMI 29.03 kg/m   Constitutional:  Well nourished. Alert and oriented, No acute distress. HEENT:  AT, moist mucus membranes.  Trachea midline Cardiovascular: No clubbing, cyanosis, or edema. Respiratory: Normal respiratory effort, no increased work of breathing. Neurologic: Grossly intact, no focal deficits, moving all 4 extremities. Psychiatric: Normal mood and affect.   Laboratory Data: Component     Latest Ref Rng 05/16/2022  Hemoglobin     13.0 - 17.7 g/dL 13/04/2022 (H)   HCT     31.5 - 51.0 % 55.6 (H)     Legend: (H) High  Component     Latest Ref Rng 05/16/2022  Testosterone     264 - 916 ng/dL 13/04/2022     Component     Latest Ref Rng 05/16/2022  Prostate Specific Ag, Serum     0.0 - 4.0 ng/mL 8.5 (H)     Legend: (H) High I have reviewed the labs.    Pertinent Imaging  05/21/22 15:47  Scan Result 14    Assessment & Plan:    1.  Elevated PSA -He did have an inadequately treated positive urine culture for Klebsiella in October so this is likely the cause of his elevated PSA -UA with bacteria -Urine sent for cultures, atypicals and GC and chlamydia -PVR 14 mL -Started patient on Septra DS twice daily for 7 days while we await our culture results and will adjust if necessary once they are available -Patient's testosterone is now on hold for 2 months we will repeat the PSA in 2 months with free and total  2. Testosterone deficiency  -Patient's HCT is 54% or greater.  We will need to discontinue the testosterone therapy for 2 months and recheck the HBG/HCT and testosterone levels.  If the HBG/HCT normalize, we will need to restart the testosterone therapy at a reduced dose.  If the HBG/HCT does not normalize or continues to increase even at a reduced dose, patient we need to evaluated for sleep apnea and/or hypoxia and testosterone therapy discontinued.    -He states he is compliant with his CPAP machine and sleeps with it nightly -I suggested that if he had a chance of starting the testosterone therapy back before 2 months that I would like our hematologist to take a  look at him and get their opinion regarding what is causing his erythrocytosis and whether or not it is safe for him to donate blood in order to get his hemoglobin hematocrit to safe levels for testosterone therapy -He is agreeable and I placed a referral to hematology  3. ED  - sildenafil 100 mg, on-demand-dosing  4. Frequency -Not bothersome at this time  Return in about 2 months (around 07/21/2022) for follow up psa, testosterone, H & H and ua .   Cloretta Ned   [facility] [address] [place], [place] [address] [phone]

## 2022-05-21 ENCOUNTER — Encounter: Payer: Self-pay | Admitting: Urology

## 2022-05-21 ENCOUNTER — Ambulatory Visit: Payer: Managed Care, Other (non HMO) | Admitting: Urology

## 2022-05-21 VITALS — BP 135/93 | HR 102 | Ht 73.0 in | Wt 220.0 lb

## 2022-05-21 DIAGNOSIS — N529 Male erectile dysfunction, unspecified: Secondary | ICD-10-CM

## 2022-05-21 DIAGNOSIS — N390 Urinary tract infection, site not specified: Secondary | ICD-10-CM

## 2022-05-21 DIAGNOSIS — E291 Testicular hypofunction: Secondary | ICD-10-CM

## 2022-05-21 DIAGNOSIS — D751 Secondary polycythemia: Secondary | ICD-10-CM

## 2022-05-21 DIAGNOSIS — R972 Elevated prostate specific antigen [PSA]: Secondary | ICD-10-CM | POA: Diagnosis not present

## 2022-05-21 DIAGNOSIS — R399 Unspecified symptoms and signs involving the genitourinary system: Secondary | ICD-10-CM

## 2022-05-21 DIAGNOSIS — E349 Endocrine disorder, unspecified: Secondary | ICD-10-CM

## 2022-05-21 LAB — MICROSCOPIC EXAMINATION

## 2022-05-21 LAB — URINALYSIS, COMPLETE
Bilirubin, UA: NEGATIVE
Glucose, UA: NEGATIVE
Ketones, UA: NEGATIVE
Leukocytes,UA: NEGATIVE
Nitrite, UA: NEGATIVE
Protein,UA: NEGATIVE
RBC, UA: NEGATIVE
Specific Gravity, UA: 1.01 (ref 1.005–1.030)
Urobilinogen, Ur: 0.2 mg/dL (ref 0.2–1.0)
pH, UA: 6 (ref 5.0–7.5)

## 2022-05-21 LAB — BLADDER SCAN AMB NON-IMAGING: Scan Result: 14

## 2022-05-21 MED ORDER — SULFAMETHOXAZOLE-TRIMETHOPRIM 800-160 MG PO TABS: 1.0000 | ORAL_TABLET | Freq: Two times a day (BID) | ORAL | 0 refills | Status: DC

## 2022-05-24 LAB — GC/CHLAMYDIA PROBE AMP
Chlamydia trachomatis, NAA: NEGATIVE
Neisseria Gonorrhoeae by PCR: NEGATIVE

## 2022-05-27 ENCOUNTER — Telehealth: Payer: Self-pay

## 2022-05-27 LAB — CULTURE, URINE COMPREHENSIVE

## 2022-05-27 NOTE — Telephone Encounter (Signed)
Notified pt as advised, pt expressed understanding.  ?

## 2022-05-27 NOTE — Telephone Encounter (Signed)
-----   Message from [name], PA-C sent at 05/27/2022  1:49 PM EST ----- Please let Mr. Sollenberger know that his urine culture grew out a very small amount of bacteria, but since his previous UTI was under treated, I would continue to take the Septra DS.

## 2022-05-28 ENCOUNTER — Inpatient Hospital Stay: Payer: Managed Care, Other (non HMO)

## 2022-05-28 ENCOUNTER — Encounter: Payer: Self-pay | Admitting: Oncology

## 2022-05-28 ENCOUNTER — Telehealth: Payer: Self-pay | Admitting: *Deleted

## 2022-05-28 ENCOUNTER — Inpatient Hospital Stay: Payer: Managed Care, Other (non HMO) | Attending: Oncology | Admitting: Oncology

## 2022-05-28 VITALS — BP 132/90 | HR 81 | Temp 97.7°F | Wt 219.9 lb

## 2022-05-28 DIAGNOSIS — D751 Secondary polycythemia: Secondary | ICD-10-CM | POA: Insufficient documentation

## 2022-05-28 LAB — CBC WITH DIFFERENTIAL/PLATELET
Abs Immature Granulocytes: 0.01 10*3/uL (ref 0.00–0.07)
Basophils Absolute: 0 10*3/uL (ref 0.0–0.1)
Basophils Relative: 1 %
Eosinophils Absolute: 0.1 10*3/uL (ref 0.0–0.5)
Eosinophils Relative: 2 %
HCT: 50.5 % (ref 39.0–52.0)
Hemoglobin: 16.9 g/dL (ref 13.0–17.0)
Immature Granulocytes: 0 %
Lymphocytes Relative: 31 %
Lymphs Abs: 1.3 10*3/uL (ref 0.7–4.0)
MCH: 30.2 pg (ref 26.0–34.0)
MCHC: 33.5 g/dL (ref 30.0–36.0)
MCV: 90.2 fL (ref 80.0–100.0)
Monocytes Absolute: 0.5 10*3/uL (ref 0.1–1.0)
Monocytes Relative: 12 %
Neutro Abs: 2.3 10*3/uL (ref 1.7–7.7)
Neutrophils Relative %: 54 %
Platelets: 205 10*3/uL (ref 150–400)
RBC: 5.6 MIL/uL (ref 4.22–5.81)
RDW: 12.5 % (ref 11.5–15.5)
WBC: 4.2 10*3/uL (ref 4.0–10.5)
nRBC: 0 % (ref 0.0–0.2)

## 2022-05-28 LAB — COMPREHENSIVE METABOLIC PANEL
ALT: 47 U/L — ABNORMAL HIGH (ref 0–44)
AST: 27 U/L (ref 15–41)
Albumin: 4.3 g/dL (ref 3.5–5.0)
Alkaline Phosphatase: 55 U/L (ref 38–126)
Anion gap: 4 — ABNORMAL LOW (ref 5–15)
BUN: 16 mg/dL (ref 6–20)
CO2: 28 mmol/L (ref 22–32)
Calcium: 9.1 mg/dL (ref 8.9–10.3)
Chloride: 103 mmol/L (ref 98–111)
Creatinine, Ser: 1.29 mg/dL — ABNORMAL HIGH (ref 0.61–1.24)
GFR, Estimated: >60 mL/min (ref >60)
Glucose, Bld: 114 mg/dL — ABNORMAL HIGH (ref 70–99)
Potassium: 4.4 mmol/L (ref 3.5–5.1)
Sodium: 135 mmol/L (ref 135–145)
Total Bilirubin: 0.6 mg/dL (ref 0.3–1.2)
Total Protein: 7.6 g/dL (ref 6.5–8.1)

## 2022-05-28 LAB — MYCOPLASMA / UREAPLASMA CULTURE
Mycoplasma hominis Culture: NEGATIVE
Ureaplasma urealyticum: NEGATIVE

## 2022-05-28 NOTE — Telephone Encounter (Signed)
Called pt and told him that our lab is working to see if there is anything that can be done to get labs cancelled. Will reach out to him On Monday to let him know the status. Pt verbalized understanding.

## 2022-05-28 NOTE — Telephone Encounter (Signed)
patient called stating atht he wants labs cancelled because his wife works for [employer] and he can get them done free and that the last time her had a panel drawn it costed him $1000 out of pocket Please call him to discuss. Ciera said labs for [facility] have already been sent.and his CBC, CMP has already resulted

## 2022-05-28 NOTE — Progress Notes (Signed)
Patient is referred here by Michiel Cowboy for erythrocytosis.

## 2022-05-30 NOTE — Progress Notes (Signed)
Hematology/Oncology Consult note [phone] [phone]      Patient Care Team: Pleas Koch, NP as PCP - General (Internal Medicine) Kate Sable, MD as PCP - Cardiology (Cardiology) Vevelyn Francois, MD as Referring Physician (Pain Medicine)  REFERRING PROVIDER: Pleas Koch, NP   CHIEF COMPLAINTS/REASON FOR VISIT:  Evaluation of erythrocytosis  HISTORY OF PRESENTING ILLNESS:   Darren Mason is a  41 y.o.  male with [place] listed below was seen in consultation at the request of  Pleas Koch, NP  for evaluation of /erythrocytosis/elevated hemoglobin,   Patient has abnormal CBC with hemoglobin of 18.5 on 05/16/2022. Reviewed patient's previous labs.  Elevated of hemoglobin is new.  Patient denies unintentional weight loss, fever, chills, fatigue, night sweats.  Patient is a former smoker.  Currently he does not smoke.  He is on testosterone replacement for 10 years. No other new complaints.   MEDICAL HISTORY:  Past Medical History:  Diagnosis Date   ADHD    Anxiety and depression    Chronic back pain    Osteoarthritis    Serotonin syndrome    Sleep apnea    CPAP machine    Testosterone deficiency     SURGICAL HISTORY: Past Surgical History:  Procedure Laterality Date   APPENDECTOMY  2001   BACK SURGERY  05/2017   BACK SURGERY  04/06/2020   stemulator placed     SOCIAL HISTORY: Social History   Socioeconomic History   Marital status: Married    Spouse name: Not on file   Number of children: 1   Years of education: Not on file   Highest education level: Not on file  Occupational History   Not on file  Tobacco Use   Smoking status: Former    Packs/day: 0.50    Years: 5.00    Total pack years: 2.50    Types: Cigarettes    Start date: 07/07/1993    Quit date: 07/07/1998    Years since quitting: 23.9   Smokeless tobacco: Current    Types: Chew  Vaping Use   Vaping Use: Never used  Substance and Sexual  Activity   Alcohol use: Yes    Comment: occasionally   Drug use: No   Sexual activity: Yes    Comment: wife gats injection  Other Topics Concern   Not on file  Social History Narrative   On disability from [employer]   Social Determinants of Health   Financial Resource Strain: Not on file  Food Insecurity: Not on file  Transportation Needs: Not on file  Physical Activity: Not on file  Stress: Not on file  Social Connections: Not on file  Intimate Partner Violence: Not on file    FAMILY HISTORY: Family History  Problem Relation Age of Onset   Hypertension Mother    Hyperlipidemia Mother    Hypertension Father    Hyperlipidemia Father    ADD / ADHD Brother    Colon cancer Maternal Grandmother    Prostate cancer Neg Hx    Kidney cancer Neg Hx    Bladder Cancer Neg Hx     ALLERGIES:  is allergic to nickel.  MEDICATIONS:  Current Outpatient Medications  Medication Sig Dispense Refill   buPROPion (WELLBUTRIN XL) 300 MG 24 hr tablet Take 1 tablet (300 mg total) by mouth daily. 90 tablet 1   carisoprodol (SOMA) 350 MG tablet Take 350 mg by mouth 3 (three) times daily as needed.     carvedilol (  COREG) 12.5 MG tablet TAKE 1 TABLET(12.5 MG) BY MOUTH TWICE DAILY FOR BLOOD PRESSURE 180 tablet 2   losartan (COZAAR) 100 MG tablet Take 1 tablet (100 mg total) by mouth daily. for blood pressure. 90 tablet 3   methylphenidate (CONCERTA) 54 MG PO CR tablet Take 1 tablet (54 mg total) by mouth daily before breakfast. 30 tablet 0   [START ON 06/18/2022] methylphenidate (CONCERTA) 54 MG PO CR tablet Take 1 tablet (54 mg total) by mouth daily before breakfast. 30 tablet 0   NEEDLE, DISP, 18 G (BD DISP NEEDLES) 18G X 1-1/2" MISC 1 mg by Does not apply route every 14 (fourteen) days. 50 each 0   oxyCODONE-acetaminophen (PERCOCET) 10-325 MG tablet Take 1 tablet by mouth every 4 (four) hours as needed for pain. Up to 6 tablets PRN     pregabalin (LYRICA) 150 MG capsule Take 150  mg by mouth 3 (three) times daily.     PREVIDENT 5000 DRY MOUTH 1.1 % GEL dental gel USE THREE TIMES A DAY AFTER MEALS. DO NOT RINSE.    SLS FREE WILL NOT FOAM.     QUEtiapine (SEROQUEL) 50 MG tablet Take 1 tablet (50 mg total) by mouth at bedtime. 30 tablet 1   sildenafil (VIAGRA) 100 MG tablet TAKE 1 TABLET AS NEEDED FOR ERECTILE DYSFUNCTION 30 tablet 3   sulfamethoxazole-trimethoprim (BACTRIM DS) 800-160 MG tablet Take 1 tablet by mouth every 12 (twelve) hours. 14 tablet 0   Syringe, Disposable, (2-3CC SYRINGE) 3 ML MISC 1 mg by Does not apply route every 14 (fourteen) days. 25 each 3   testosterone cypionate (DEPOTESTOSTERONE CYPIONATE) 200 MG/ML injection INJECT 1 ML INTO THE MUSCLE EVERY 10 DAYS. 3 mL 1   NEEDLE, DISP, 21 G (BD DISP NEEDLES) 21G X 1-1/2" MISC 1 mg by Does not apply route every 14 (fourteen) days. (Patient not taking: Reported on 05/28/2022) 50 each 0   No current facility-administered medications for this visit.     Review of Systems  Constitutional:  Negative for appetite change, chills, fatigue, fever and unexpected weight change.  HENT:   Negative for hearing loss and voice change.   Eyes:  Negative for eye problems and icterus.  Respiratory:  Negative for chest tightness, cough and shortness of breath.   Cardiovascular:  Negative for chest pain and leg swelling.  Gastrointestinal:  Negative for abdominal distention and abdominal pain.  Endocrine: Negative for hot flashes.  Genitourinary:  Negative for difficulty urinating, dysuria and frequency.   Musculoskeletal:  Positive for back pain. Negative for arthralgias.  Skin:  Negative for itching and rash.  Neurological:  Negative for light-headedness and numbness.  Hematological:  Negative for adenopathy. Does not bruise/bleed easily.  Psychiatric/Behavioral:  Negative for confusion.     PHYSICAL EXAMINATION: ECOG PERFORMANCE STATUS: 0 - Asymptomatic Vitals:   05/28/22 1131  BP: (!) 132/90  Pulse: 81  Temp:  97.7 F (36.5 C)  SpO2: 99%   Filed Weights   05/28/22 1131  Weight: 219 lb 14.4 oz (99.7 kg)    Physical Exam Constitutional:      General: He is not in acute distress. HENT:     Head: Normocephalic and atraumatic.  Eyes:     General: No scleral icterus. Cardiovascular:     Rate and Rhythm: Normal rate and regular rhythm.     Heart sounds: Normal heart sounds.  Pulmonary:     Effort: Pulmonary effort is normal. No respiratory distress.     Breath sounds:  No wheezing.  Abdominal:     General: Bowel sounds are normal. There is no distension.     Palpations: Abdomen is soft.  Musculoskeletal:        General: No deformity. Normal range of motion.     Cervical back: Normal range of motion and neck supple.  Skin:    General: Skin is warm and dry.     Findings: No erythema or rash.  Neurological:     Mental Status: He is alert and oriented to person, place, and time. Mental status is at baseline.     Cranial Nerves: No cranial nerve deficit.     Coordination: Coordination normal.  Psychiatric:        Mood and Affect: Mood normal.     LABORATORY DATA:  I have reviewed the data as listed    Latest Ref Rng & Units 05/28/2022   12:24 PM 05/16/2022    2:11 PM 11/18/2021   11:32 AM  CBC  WBC 4.0 - 10.5 K/uL 4.2     Hemoglobin 13.0 - 17.0 g/dL 16.9  18.5  16.7   Hematocrit 39.0 - 52.0 % 50.5  55.6  49.3   Platelets 150 - 400 K/uL 205         Latest Ref Rng & Units 05/28/2022   12:24 PM 01/25/2020   10:57 AM 10/24/2019   10:02 AM  CMP  Glucose 70 - 99 mg/dL 114     BUN 6 - 20 mg/dL 16     Creatinine 0.61 - 1.24 mg/dL 1.29     Sodium 135 - 145 mmol/L 135     Potassium 3.5 - 5.1 mmol/L 4.4     Chloride 98 - 111 mmol/L 103     CO2 22 - 32 mmol/L 28     Calcium 8.9 - 10.3 mg/dL 9.1     Total Protein 6.5 - 8.1 g/dL 7.6  8.2  7.5   Total Bilirubin 0.3 - 1.2 mg/dL 0.6  0.4  0.3   Alkaline Phos 38 - 126 U/L 55  92  106   AST 15 - 41 U/L 27  28  44   ALT 0 - 44 U/L 47  66   101      RADIOGRAPHIC STUDIES: I have personally reviewed the radiological images as listed and agreed with the findings in the report. No results found.   ASSESSMENT & PLAN:   Erythrocytosis Erythrocytosis, this is new onset.  Likely secondary to chronic testosterone use. Patient agrees workup to rule out primary etiologies. Check CBC, CMP, JAK2 mutation with reflex, erythropoietin. Secondary erythrocytosis, we discussed about phlebotomy to keep hematocrit less than 50 so that he may stay on testosterone treatments safely.  He agrees.     Orders Placed This Encounter  Procedures   CBC with Differential/Platelet    Standing Status:   Future    Number of Occurrences:   1    Standing Expiration Date:   05/29/2023   Comprehensive metabolic panel    Standing Status:   Future    Number of Occurrences:   1    Standing Expiration Date:   05/29/2023   Erythropoietin    Standing Status:   Future    Number of Occurrences:   1    Standing Expiration Date:   05/29/2023   JAK2 V617F rfx CALR/MPL/E12-15    Standing Status:   Future    Number of Occurrences:   1    Standing Expiration Date:  05/29/2023    All questions were answered. The patient knows to call the clinic with any problems questions or concerns.  Follow-up to be determined. Pleas Koch, NP Thank you for this kind referral and the opportunity to participate in the care of this patient. A copy of today's note is routed to referring provider   Earlie Server, MD, PhD Hematology Oncology 05/28/2022

## 2022-05-30 NOTE — Assessment & Plan Note (Signed)
Erythrocytosis, this is new onset.  Likely secondary to chronic testosterone use. Patient agrees workup to rule out primary etiologies. Check CBC, CMP, JAK2 mutation with reflex, erythropoietin. Secondary erythrocytosis, we discussed about phlebotomy to keep hematocrit less than 50 so that he may stay on testosterone treatments safely.  He agrees.

## 2022-06-02 NOTE — Telephone Encounter (Signed)
Per [facility] (lab)- Cbc and cmp have already resulted. [facility] never received the blood because they had the [facility] person remove it from the batch.   Called pt back and informed him that we would give him a test requisition form with the labs that were not collected. Pt verbalized understanding.

## 2022-06-05 ENCOUNTER — Other Ambulatory Visit: Payer: Self-pay | Admitting: Oncology

## 2022-06-05 LAB — JAK2 V617F RFX CALR/MPL/E12-15

## 2022-06-05 LAB — ERYTHROPOIETIN

## 2022-06-10 ENCOUNTER — Encounter: Payer: Self-pay | Admitting: Hematology & Oncology

## 2022-06-11 ENCOUNTER — Other Ambulatory Visit: Payer: Managed Care, Other (non HMO)

## 2022-06-11 ENCOUNTER — Other Ambulatory Visit: Payer: Self-pay | Admitting: Oncology

## 2022-06-11 ENCOUNTER — Telehealth: Payer: Self-pay

## 2022-06-11 ENCOUNTER — Ambulatory Visit: Payer: Managed Care, Other (non HMO) | Admitting: Oncology

## 2022-06-11 DIAGNOSIS — D751 Secondary polycythemia: Secondary | ICD-10-CM

## 2022-06-11 NOTE — Telephone Encounter (Signed)
Please schedule and inform pt of appts, he is aware of plan:   Phlebotomy x1 Lab/MD/ phleb in 3 months

## 2022-06-11 NOTE — Telephone Encounter (Signed)
-----   Message from [name], MD sent at 06/11/2022 10:01 AM EST ----- Please arrange him to get phlebotomy- he is aware about the plan 3 months lab MD + phleb Please order cbc   zy.

## 2022-06-12 ENCOUNTER — Ambulatory Visit: Payer: Managed Care, Other (non HMO) | Admitting: Primary Care

## 2022-06-12 ENCOUNTER — Encounter: Payer: Self-pay | Admitting: Primary Care

## 2022-06-12 VITALS — BP 130/82 | HR 65 | Temp 97.6°F | Ht 73.0 in | Wt 218.0 lb

## 2022-06-12 DIAGNOSIS — I10 Essential (primary) hypertension: Secondary | ICD-10-CM | POA: Diagnosis not present

## 2022-06-12 DIAGNOSIS — E349 Endocrine disorder, unspecified: Secondary | ICD-10-CM

## 2022-06-12 NOTE — Patient Instructions (Signed)
You will either be contacted via phone regarding your referral to Urology, or you may receive a letter on your MyChart portal from our referral team with instructions for scheduling an appointment. Please let us know if you have not been contacted by anyone within two weeks.  Start monitoring your blood pressure daily, around the same time of day, for the next 2-3 weeks.  Ensure that you have rested for 30 minutes prior to checking your blood pressure.   Record your readings and notify me if you see numbers consistently at or above 130 on top and/or 90 on bottom.  It was a pleasure to see you today!

## 2022-06-12 NOTE — Assessment & Plan Note (Signed)
Initially above goal, improved on recheck. Recommended he start monitoring at home.  Continue with losartan 100 mg daily and carvedilol 12.5 mg twice daily.

## 2022-06-12 NOTE — Assessment & Plan Note (Addendum)
Reviewed Urology notes/labs and Hematology notes/labs from November 2023.  Agree with Urology to hold testosterone treatment until PSA has been rechecked. Symptoms now seem to be secondary to abrupt withdrawal of testosterone. He agrees. He also declines further work up for symptoms including labs. Exam today benign. He is in no distress.   He will reconnect with Urology to learn if there are other options for treatment. Proceed with phlebotomy as scheduled.   Referral placed for Urology today for second opinion per patient request. We had a long discussion regarding the safety concerns of his elevated PSA and hematocrit with testosterone use. We discussed that there is no guarantee that another Urologist would resume treatment if his PSA was still elevated.

## 2022-06-12 NOTE — Progress Notes (Signed)
Subjective:    Patient ID: Darren Mason, male    DOB: December 25, 1980, 41 y.o.   MRN: [id number]  HPI  Darren Mason is a very pleasant 41 y.o. male with a history of hypertension, testosterone deficiency, chronic back pain, ADHD, fatty liver who presents today requesting referral to Urology.  Currently following with [facility] and is managed on testosterone 200 mg injections every 10 days. Recently evaluated by Urology on 05/21/22 for follow up from acute cystitis and elevated PSA. He was initiated on Bactrim DS tablets BID x 7 days as he did not complete the prior antibiotic course of Cipro prescribed by his rheumatologist. Urine culture positive. His PSA was noted to be elevated and hematocrit was 54% so his testosterone was placed on hold for 2 months. He will undergo repeat CBC in 2 months and if levels have normalized then he may resume testosterone at a lower dose. He was also referred to hematology for evaluation of erythrocytosis and whether it was safe for him to donate blood to reduce hemoglobin/hematocrit so that he can safely resume testosterone injections.  Evaluated by hematology on 05/28/22 for new onset erythrocytosis. During this visit his new onset was suspected to be secondary to chronic testosterone use. He underwent lab work, but not all of the labs were collected as his insurance would not cover. It was also advised he undergo phlebotomy so that he could keep hematocrit at a safe level for testosterone therapy.   Since early November 2023 he's developed migraines, insomnia, fatigue, little motivation to do anything, body aches, dizziness, brain fog, and decreased appetite. His last dose of testosterone was on 05/06/22. He notified his urologist of these symptoms who referred him to his PCP.   Headaches are occurring daily and are located to the parietal and bilateral temporal lobes. Migraines occur every other day with nausea. He denies photophobia and phonophobia. No prior  diagnosis of migraines.   He is scheduled for his phlebotomy on 06/17/22. His main concern today is his frustration with having to hold his testosterone medication. He feels that no one is listening to him regarding the side effects from being off of his testosterone treatment. He would rather chance the risks of an elevated PSA with testosterone treatment. He suspects is PSA was elevated because of his recent UTI. He denies dysuria, difficulty urinating, hematuria. He would like a referral to another Urologist for a second opinion.   BP Readings from Last 3 Encounters:  06/12/22 130/82  05/28/22 (!) 132/90  05/21/22 (!) 135/93   He checks his BP at home which runs in the 130's/80's-90's. He is compliant to his losartan 100 mg daily, carvedilol 12.5 mg daily.   Review of Systems  Constitutional:  Positive for appetite change and fatigue.  Genitourinary:  Negative for dysuria, frequency and hematuria.  Musculoskeletal:  Positive for myalgias.  Neurological:  Positive for dizziness and headaches.  Psychiatric/Behavioral:  Positive for sleep disturbance.          Past Medical History:  Diagnosis Date   ADHD    Anxiety and depression    Chronic back pain    Osteoarthritis    Serotonin syndrome    Sleep apnea    CPAP machine    Testosterone deficiency     Social History   Socioeconomic History   Marital status: Married    Spouse name: Not on file   Number of children: 1   Years of education: Not on file   Highest education  level: Not on file  Occupational History   Not on file  Tobacco Use   Smoking status: Former    Packs/day: 0.50    Years: 5.00    Total pack years: 2.50    Types: Cigarettes    Start date: 07/07/1993    Quit date: 07/07/1998    Years since quitting: 23.9   Smokeless tobacco: Current    Types: Chew  Vaping Use   Vaping Use: Never used  Substance and Sexual Activity   Alcohol use: Yes    Comment: occasionally   Drug use: No   Sexual activity: Yes     Comment: wife gats injection  Other Topics Concern   Not on file  Social History Narrative   On disability from [employer].   Social Determinants of Health   Financial Resource Strain: Not on file  Food Insecurity: Not on file  Transportation Needs: Not on file  Physical Activity: Not on file  Stress: Not on file  Social Connections: Not on file  Intimate Partner Violence: Not on file    Past Surgical History:  Procedure Laterality Date   APPENDECTOMY  2001   BACK SURGERY  05/2017   BACK SURGERY  04/06/2020   stemulator placed     Family History  Problem Relation Age of Onset   Hypertension Mother    Hyperlipidemia Mother    Hypertension Father    Hyperlipidemia Father    ADD / ADHD Brother    Colon cancer Maternal Grandmother    Prostate cancer Neg Hx    Kidney cancer Neg Hx    Bladder Cancer Neg Hx     Allergies  Allergen Reactions   Nickel Rash    Current Outpatient Medications on File Prior to Visit  Medication Sig Dispense Refill   buPROPion (WELLBUTRIN XL) 300 MG 24 hr tablet Take 1 tablet (300 mg total) by mouth daily. 90 tablet 1   carisoprodol (SOMA) 350 MG tablet Take 350 mg by mouth 3 (three) times daily as needed.     carvedilol (COREG) 12.5 MG tablet TAKE 1 TABLET(12.5 MG) BY MOUTH TWICE DAILY FOR BLOOD PRESSURE 180 tablet 2   losartan (COZAAR) 100 MG tablet Take 1 tablet (100 mg total) by mouth daily. for blood pressure. 90 tablet 3   [START ON 06/18/2022] methylphenidate (CONCERTA) 54 MG PO CR tablet Take 1 tablet (54 mg total) by mouth daily before breakfast. 30 tablet 0   NEEDLE, DISP, 18 G (BD DISP NEEDLES) 18G X 1-1/2" MISC 1 mg by Does not apply route every 14 (fourteen) days. 50 each 0   NEEDLE, DISP, 21 G (BD DISP NEEDLES) 21G X 1-1/2" MISC 1 mg by Does not apply route every 14 (fourteen) days. 50 each 0   oxyCODONE-acetaminophen (PERCOCET) 10-325 MG tablet Take 1 tablet by mouth every 4 (four) hours as needed for pain. Up  to 6 tablets PRN     pregabalin (LYRICA) 150 MG capsule Take 150 mg by mouth 3 (three) times daily.     PREVIDENT 5000 DRY MOUTH 1.1 % GEL dental gel USE THREE TIMES A DAY AFTER MEALS. DO NOT RINSE.    SLS FREE WILL NOT FOAM.     sildenafil (VIAGRA) 100 MG tablet TAKE 1 TABLET AS NEEDED FOR ERECTILE DYSFUNCTION 30 tablet 3   Syringe, Disposable, (2-3CC SYRINGE) 3 ML MISC 1 mg by Does not apply route every 14 (fourteen) days. 25 each 3   methylphenidate (CONCERTA) 54 MG PO  CR tablet Take 1 tablet (54 mg total) by mouth daily before breakfast. (Patient not taking: Reported on 06/12/2022) 30 tablet 0   testosterone cypionate (DEPOTESTOSTERONE CYPIONATE) 200 MG/ML injection INJECT 1 ML INTO THE MUSCLE EVERY 10 DAYS. (Patient not taking: Reported on 06/12/2022) 3 mL 1   No current facility-administered medications on file prior to visit.    BP 130/82   Pulse 65   Temp 97.6 F (36.4 C) (Temporal)   Ht 6\' 1"  (1.854 m)   Wt 218 lb (98.9 kg)   SpO2 98%   BMI 28.76 kg/m  Objective:   Physical Exam Cardiovascular:     Rate and Rhythm: Normal rate and regular rhythm.  Pulmonary:     Effort: Pulmonary effort is normal.     Breath sounds: Normal breath sounds. No wheezing or rales.  Musculoskeletal:     Cervical back: Neck supple.  Skin:    General: Skin is warm and dry.  Neurological:     Mental Status: He is alert and oriented to person, place, and time.           Assessment & Plan:   Problem List Items Addressed This Visit       Cardiovascular and Mediastinum   Essential hypertension    Initially above goal, improved on recheck. Recommended he start monitoring at home.  Continue with losartan 100 mg daily and carvedilol 12.5 mg twice daily.        Other   Testosterone deficiency - Primary    Reviewed Urology notes/labs and Hematology notes/labs from November 2023.  Agree with Urology to hold testosterone treatment until PSA has been rechecked. Symptoms now seem to be  secondary to abrupt withdrawal of testosterone. He agrees. He also declines further work up for symptoms including labs. Exam today benign. He is in no distress.   He will reconnect with Urology to learn if there are other options for treatment. Proceed with phlebotomy as scheduled.   Referral placed for Urology today for second opinion per patient request. We had a long discussion regarding the safety concerns of his elevated PSA and hematocrit with testosterone use. We discussed that there is no guarantee that another Urologist would resume treatment if his PSA was still elevated.        Relevant Orders   Ambulatory referral to Urology       December 2023, NP

## 2022-06-16 ENCOUNTER — Encounter: Payer: Self-pay | Admitting: *Deleted

## 2022-06-17 ENCOUNTER — Inpatient Hospital Stay: Payer: Managed Care, Other (non HMO) | Attending: Oncology

## 2022-06-17 VITALS — BP 136/86 | HR 85 | Temp 97.9°F | Resp 16

## 2022-06-17 DIAGNOSIS — D751 Secondary polycythemia: Secondary | ICD-10-CM | POA: Insufficient documentation

## 2022-06-19 NOTE — Telephone Encounter (Signed)
Appt noted in referral

## 2022-07-01 ENCOUNTER — Telehealth: Payer: Self-pay

## 2022-07-01 NOTE — Telephone Encounter (Signed)
pt left a message that he needed refill on the wellbutrin. pt last seen on 05-19-22 next appt 07-17-22

## 2022-07-01 NOTE — Telephone Encounter (Signed)
Per review of EHR, patient has requested Wellbutrin refills too soon.  Will defer this to Dr. Vanetta Shawl to address once she is back.

## 2022-07-02 NOTE — Telephone Encounter (Signed)
according to pharmacy the insurance will not let them fill rx. pt needs to go to [facility] to have medication filled.

## 2022-07-02 NOTE — Telephone Encounter (Signed)
called patient he states that he was trying to get rx sent to the [facility] on [address] .  Pharmacy updated in the system. Please send [facility] to the [facility]

## 2022-07-02 NOTE — Telephone Encounter (Signed)
Please contact [facility] on [address] and check with the pharmacist when this patient picked up his last refill on Wellbutrin.  According to our system it is too soon to send to another pharmacy at this time.  However if he has not picked up the refill at [facility] to which it was sent by Dr. Vanetta Shawl previously could ask them to transfer it to his new [facility] choice or asked them to cancel it so I can send a new prescription to the new pharmacy of choice.  Please let me know.

## 2022-07-03 NOTE — Telephone Encounter (Signed)
he has not picked up anything at that pharmacy

## 2022-07-03 NOTE — Telephone Encounter (Signed)
I contacted this patient.  Patient did pick up a prescription from [facility] for Wellbutrin XL 300 mg.  However patient reports he picked this prescription on September 28 and he has no refills left on it.  Our record here shows that Dr. Vanetta Shawl sent out a prescription for Wellbutrin XL 300 mg on 01/23/2022-90 days supply with 1 refill-that means he will only be due for a prescription on July 26, 2022.  Will have Shanda Bumps CMA contact [facility] [address], talk to the pharmacist and verify this.

## 2022-07-04 ENCOUNTER — Other Ambulatory Visit: Payer: Self-pay | Admitting: Psychiatry

## 2022-07-04 ENCOUNTER — Telehealth: Payer: Self-pay | Admitting: Psychiatry

## 2022-07-04 DIAGNOSIS — F331 Major depressive disorder, recurrent, moderate: Secondary | ICD-10-CM

## 2022-07-04 MED ORDER — BUPROPION HCL ER (XL) 300 MG PO TB24: 300.0000 mg | ORAL_TABLET | Freq: Every day | ORAL | 0 refills | Status: DC

## 2022-07-04 NOTE — Telephone Encounter (Signed)
buPROPion (WELLBUTRIN XL) 300 MG 24 hr tablet 90 tablet 1 01/23/2022 07/22/2022   Sig - Route: Take 1 tablet (300 mg total) by mouth daily. - Oral   Sent to pharmacy as: buPROPion (WELLBUTRIN XL) 300 MG 24 hr tablet   E-Prescribing Status: Receipt confirmed by pharmacy (01/23/2022 11:04 AM EDT)     Did you check with pharmacy about this order from Dr.Hisada ?

## 2022-07-04 NOTE — Telephone Encounter (Signed)
pt last received wellbutrin was 04-03-22 for a 90 day supply.

## 2022-07-04 NOTE — Telephone Encounter (Signed)
left message rx sent to pharmacy 

## 2022-07-04 NOTE — Telephone Encounter (Signed)
I have sent a 90-day supply of Wellbutrin 300 mg to [facility] [address] Since previous prescription sent out by Dr. Vanetta Shawl has expired per pharmacist.
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
## 2022-07-17 ENCOUNTER — Ambulatory Visit: Payer: 59 | Admitting: Psychiatry

## 2022-07-17 ENCOUNTER — Encounter: Payer: Self-pay | Admitting: Psychiatry

## 2022-07-17 ENCOUNTER — Telehealth: Payer: Self-pay | Admitting: Psychiatry

## 2022-07-17 VITALS — BP 135/81 | HR 74 | Temp 97.9°F | Ht 72.5 in | Wt 220.0 lb

## 2022-07-17 DIAGNOSIS — F331 Major depressive disorder, recurrent, moderate: Secondary | ICD-10-CM | POA: Diagnosis not present

## 2022-07-17 DIAGNOSIS — F900 Attention-deficit hyperactivity disorder, predominantly inattentive type: Secondary | ICD-10-CM

## 2022-07-17 MED ORDER — METHYLPHENIDATE HCL ER (OSM) 54 MG PO TBCR: 54.0000 mg | EXTENDED_RELEASE_TABLET | Freq: Every day | ORAL | 0 refills | Status: DC

## 2022-07-17 NOTE — Telephone Encounter (Signed)
Patient came back by office after appointment with you. He went to have urinalysis at hospital lab, they indicated it may not be covered by his insurance. Wants to know if you can send to [place]. His wife works there and he is recommending to be done there.

## 2022-07-17 NOTE — Patient Instructions (Signed)
Continue bupropion 300 mg daily Hold quetiapine  Continue Concerta 54 mg daily Obtain UDS Next appointment-  3/19 at 10 AM

## 2022-07-22 ENCOUNTER — Ambulatory Visit: Payer: Managed Care, Other (non HMO) | Admitting: Urology

## 2022-07-23 LAB — URINE DRUGS OF ABUSE SCREEN W ALC, ROUTINE (REF LAB)

## 2022-07-25 ENCOUNTER — Encounter: Payer: Self-pay | Admitting: Psychiatry

## 2022-07-25 LAB — URINE DRUGS OF ABUSE SCREEN W ALC, ROUTINE (REF LAB)
Amphetamines, Urine: NEGATIVE ng/mL
Barbiturate Quant, Ur: NEGATIVE ng/mL
Benzodiazepine Quant, Ur: NEGATIVE ng/mL
Cannabinoid Quant, Ur: NEGATIVE ng/mL
Cocaine (Metab.): NEGATIVE ng/mL
Ethanol, Urine: NEGATIVE %
Methadone Screen, Urine: NEGATIVE ng/mL
PCP Quant, Ur: NEGATIVE ng/mL
Propoxyphene: NEGATIVE ng/mL

## 2022-07-25 LAB — OPIATES CONFIRMATION, URINE: OPIATES: NEGATIVE

## 2022-09-10 ENCOUNTER — Inpatient Hospital Stay: Payer: Managed Care, Other (non HMO) | Attending: Oncology

## 2022-09-10 ENCOUNTER — Encounter: Payer: Self-pay | Admitting: Oncology

## 2022-09-10 ENCOUNTER — Inpatient Hospital Stay (HOSPITAL_BASED_OUTPATIENT_CLINIC_OR_DEPARTMENT_OTHER): Payer: Managed Care, Other (non HMO) | Admitting: Oncology

## 2022-09-10 ENCOUNTER — Inpatient Hospital Stay: Payer: Managed Care, Other (non HMO)

## 2022-09-10 VITALS — BP 149/104 | HR 85 | Temp 98.7°F | Resp 16 | Ht 72.5 in | Wt 234.0 lb

## 2022-09-10 VITALS — BP 142/87 | HR 80

## 2022-09-10 DIAGNOSIS — D751 Secondary polycythemia: Secondary | ICD-10-CM

## 2022-09-10 LAB — CBC WITH DIFFERENTIAL/PLATELET
Abs Immature Granulocytes: 0.02 10*3/uL (ref 0.00–0.07)
Basophils Absolute: 0.1 10*3/uL (ref 0.0–0.1)
Basophils Relative: 1 %
Eosinophils Absolute: 0.1 10*3/uL (ref 0.0–0.5)
Eosinophils Relative: 2 %
HCT: 50.9 % (ref 39.0–52.0)
Hemoglobin: 16.4 g/dL (ref 13.0–17.0)
Immature Granulocytes: 0 %
Lymphocytes Relative: 34 %
Lymphs Abs: 1.8 10*3/uL (ref 0.7–4.0)
MCH: 30 pg (ref 26.0–34.0)
MCHC: 32.2 g/dL (ref 30.0–36.0)
MCV: 93.2 fL (ref 80.0–100.0)
Monocytes Absolute: 0.6 10*3/uL (ref 0.1–1.0)
Monocytes Relative: 12 %
Neutro Abs: 2.7 10*3/uL (ref 1.7–7.7)
Neutrophils Relative %: 51 %
Platelets: 228 10*3/uL (ref 150–400)
RBC: 5.46 MIL/uL (ref 4.22–5.81)
RDW: 12.6 % (ref 11.5–15.5)
WBC: 5.3 10*3/uL (ref 4.0–10.5)
nRBC: 0 % (ref 0.0–0.2)

## 2022-09-10 MED ORDER — SODIUM CHLORIDE 0.9 % IV SOLN
INTRAVENOUS | Status: DC | PRN
  Filled 2022-09-10: qty 250

## 2022-09-10 NOTE — Progress Notes (Signed)
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  Likely secondary to chronic testosterone use. JAK2 V617F mutation negative, with reflex to other mutations CALR, MPL, JAK 2 Ex 12-15 mutations negative.  Secondary erythrocytosis, recommend phlebotomy to keep hematocrit less than 50 so that he may stay on testosterone treatments safely.  He agrees. Proceed with phlebotomy today.  Orders Placed This Encounter  Procedures   Hemoglobin and Hematocrit (Cancer Center Only)    Standing Status:   Future    Standing Expiration Date:   09/10/2023   CBC with Differential ([facility] Only)    Standing Status:   Future    Standing Expiration Date:   09/10/2023   6 months lab - H&H +/- phlebotomy 12 months lab -MD cbc +/- phlebotomy   All questions were answered. The patient knows to call the clinic with any problems, questions or concerns.  Darren Server, MD, PhD [facility] Hematology Oncology 09/10/2022   HISTORY OF PRESENTING ILLNESS:   Darren Mason is a  42 y.o.  male with PMH listed below was seen in consultation at the request of  Pleas Koch, NP  for evaluation of /erythrocytosis/elevated hemoglobin,   Patient has abnormal CBC with hemoglobin of 18.5 on 05/16/2022. Reviewed patient's previous labs.  Elevated of hemoglobin is new.  Patient denies unintentional weight loss, fever, chills, fatigue, night sweats.  Patient is a former smoker.  Currently he does not smoke.  He is on testosterone replacement for 10 years. No other new complaints.   INTERVAL HISTORY Darren Mason is a 42 y.o. 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 Patient reports feeling well today.  He is on testosterone replacement  therapy.  No new complaints.  MEDICAL HISTORY:  Past Medical History:  Diagnosis Date   ADHD    Anxiety and depression    Chronic back pain    Osteoarthritis    Serotonin syndrome    Sleep apnea    CPAP machine    Testosterone deficiency     SURGICAL HISTORY: Past Surgical History:  Procedure Laterality Date   APPENDECTOMY  2001   BACK SURGERY  05/2017   BACK SURGERY  04/06/2020   stemulator placed     SOCIAL HISTORY: Social History   Socioeconomic History   Marital status: Married    Spouse name: Not on file   Number of children: 1   Years of education: Not on file   Highest education level: Not on file  Occupational History   Not on file  Tobacco Use   Smoking status: Former    Packs/day: 0.50    Years: 5.00    Total pack years: 2.50    Types: Cigarettes    Start date: 07/07/1993    Quit date: 07/07/1998    Years since quitting: 24.1   Smokeless tobacco: Current    Types: Chew   Tobacco comments:    Reports cutting back using gum  Vaping Use   Vaping Use: Never used  Substance and Sexual Activity   Alcohol use: Not Currently    Comment: occasionally   Drug use: No   Sexual activity: Yes    Partners: Female    Comment: wife gets injection  Other Topics Concern   Not on file  Social  History Narrative   On disability from [place].   Social Determinants of Health   Financial Resource Strain: Not on file  Food Insecurity: Not on file  Transportation Needs: Not on file  Physical Activity: Not on file  Stress: Not on file  Social Connections: Not on file  Intimate Partner Violence: Not on file    FAMILY HISTORY: Family History  Problem Relation Age of Onset   Hypertension Mother    Hyperlipidemia Mother    Hypertension Father    Hyperlipidemia Father    ADD / ADHD Brother    Colon cancer Maternal Grandmother    Prostate cancer Neg Hx    Kidney cancer Neg Hx    Bladder Cancer Neg Hx     ALLERGIES:  is allergic to  nickel.  MEDICATIONS:  Current Outpatient Medications  Medication Sig Dispense Refill   buPROPion (WELLBUTRIN XL) 300 MG 24 hr tablet Take 1 tablet (300 mg total) by mouth daily. 90 tablet 0   carisoprodol (SOMA) 350 MG tablet Take 350 mg by mouth 3 (three) times daily as needed.     carvedilol (COREG) 12.5 MG tablet TAKE 1 TABLET(12.5 MG) BY MOUTH TWICE DAILY FOR BLOOD PRESSURE 180 tablet 2   losartan (COZAAR) 100 MG tablet Take 1 tablet (100 mg total) by mouth daily. for blood pressure. 90 tablet 3   methylphenidate (CONCERTA) 54 MG PO CR tablet Take 1 tablet (54 mg total) by mouth daily before breakfast. 30 tablet 0   [START ON 09/15/2022] methylphenidate (CONCERTA) 54 MG PO CR tablet Take 1 tablet (54 mg total) by mouth daily before breakfast. 30 tablet 0   NEEDLE, DISP, 18 G (BD DISP NEEDLES) 18G X 1-1/2" MISC 1 mg by Does not apply route every 14 (fourteen) days. 50 each 0   NEEDLE, DISP, 21 G (BD DISP NEEDLES) 21G X 1-1/2" MISC 1 mg by Does not apply route every 14 (fourteen) days. 50 each 0   oxyCODONE-acetaminophen (PERCOCET) 10-325 MG tablet Take 1 tablet by mouth every 4 (four) hours as needed for pain. Up to 6 tablets PRN     pregabalin (LYRICA) 150 MG capsule Take 150 mg by mouth 3 (three) times daily.     PREVIDENT 5000 DRY MOUTH 1.1 % GEL dental gel USE THREE TIMES A DAY AFTER MEALS. DO NOT RINSE.    SLS FREE WILL NOT FOAM.     sildenafil (VIAGRA) 100 MG tablet TAKE 1 TABLET AS NEEDED FOR ERECTILE DYSFUNCTION 30 tablet 3   Syringe, Disposable, (2-3CC SYRINGE) 3 ML MISC 1 mg by Does not apply route every 14 (fourteen) days. 25 each 3   testosterone cypionate (DEPOTESTOSTERONE CYPIONATE) 200 MG/ML injection INJECT 1 ML INTO THE MUSCLE EVERY 10 DAYS. 3 mL 1   methylphenidate (CONCERTA) 54 MG PO CR tablet Take 1 tablet (54 mg total) by mouth daily before breakfast. 30 tablet 0   methylphenidate (CONCERTA) 54 MG PO CR tablet Take 1 tablet (54 mg total) by mouth daily before breakfast. 30  tablet 0   No current facility-administered medications for this visit.     Review of Systems  Constitutional:  Negative for appetite change, chills, fatigue, fever and unexpected weight change.  HENT:   Negative for hearing loss and voice change.   Eyes:  Negative for eye problems and icterus.  Respiratory:  Negative for chest tightness, cough and shortness of breath.   Cardiovascular:  Negative for chest pain and leg swelling.  Gastrointestinal:  Negative for abdominal distention and abdominal pain.  Endocrine: Negative for hot flashes.  Genitourinary:  Negative for difficulty urinating, dysuria and frequency.   Musculoskeletal:  Positive for back pain. Negative for arthralgias.  Skin:  Negative for itching and rash.  Neurological:  Negative for light-headedness and numbness.  Hematological:  Negative for adenopathy. Does not bruise/bleed easily.  Psychiatric/Behavioral:  Negative for confusion.     PHYSICAL EXAMINATION: ECOG PERFORMANCE STATUS: 0 - Asymptomatic Vitals:   09/10/22 1415  BP: (!) 149/104  Pulse: 85  Resp: 16  Temp: 98.7 F (37.1 C)  SpO2: 99%   Filed Weights   09/10/22 1415  Weight: 234 lb (106.1 kg)    Physical Exam Constitutional:      General: He is not in acute distress. HENT:     Head: Normocephalic and atraumatic.  Eyes:     General: No scleral icterus. Cardiovascular:     Rate and Rhythm: Normal rate and regular rhythm.     Heart sounds: Normal heart sounds.  Pulmonary:     Effort: Pulmonary effort is normal. No respiratory distress.     Breath sounds: No wheezing.  Abdominal:     General: Bowel sounds are normal. There is no distension.     Palpations: Abdomen is soft.  Musculoskeletal:        General: No deformity. Normal range of motion.     Cervical back: Normal range of motion and neck supple.  Skin:    General: Skin is warm and dry.     Findings: No erythema or rash.  Neurological:     Mental Status: He is alert and oriented  to person, place, and time. Mental status is at baseline.     Cranial Nerves: No cranial nerve deficit.     Coordination: Coordination normal.  Psychiatric:        Mood and Affect: Mood normal.     LABORATORY DATA:  I have reviewed the data as listed    Latest Ref Rng & Units 09/10/2022    2:09 PM 05/28/2022   12:24 PM 05/16/2022    2:11 PM  CBC  WBC 4.0 - 10.5 K/uL 5.3  4.2    Hemoglobin 13.0 - 17.0 g/dL 16.4  16.9  18.5   Hematocrit 39.0 - 52.0 % 50.9  50.5  55.6   Platelets 150 - 400 K/uL 228  205        Latest Ref Rng & Units 05/28/2022   12:24 PM 01/25/2020   10:57 AM 10/24/2019   10:02 AM  CMP  Glucose 70 - 99 mg/dL 114     BUN 6 - 20 mg/dL 16     Creatinine 0.61 - 1.24 mg/dL 1.29     Sodium 135 - 145 mmol/L 135     Potassium 3.5 - 5.1 mmol/L 4.4     Chloride 98 - 111 mmol/L 103     CO2 22 - 32 mmol/L 28     Calcium 8.9 - 10.3 mg/dL 9.1     Total Protein 6.5 - 8.1 g/dL 7.6  8.2  7.5   Total Bilirubin 0.3 - 1.2 mg/dL 0.6  0.4  0.3   Alkaline Phos 38 - 126 U/L 55  92  106   AST 15 - 41 U/L 27  28  44   ALT 0 - 44 U/L 47  66  101      RADIOGRAPHIC STUDIES: I have personally reviewed the radiological images as listed and agreed with the  findings in the report. No results found.  

## 2022-09-10 NOTE — Patient Instructions (Signed)

## 2022-09-10 NOTE — Assessment & Plan Note (Signed)
Erythrocytosis, this is new onset.  Likely secondary to chronic testosterone use. JAK2 V617F mutation negative, with reflex to other mutations CALR, MPL, JAK 2 Ex 12-15 mutations negative.  Secondary erythrocytosis, recommend phlebotomy to keep hematocrit less than 50 so that he may stay on testosterone treatments safely.  He agrees. Proceed with phlebotomy today.

## 2022-09-10 NOTE — Progress Notes (Signed)
Pt here for phlebotomy. Approximately 371m off, pt noted to be diaphoretic, pale, stating "this hasn't happened to me before". Stopped phlebotomy, started NS bolus, crackers and extra drink provided. Pt started feeling better soon after IVF started. Stable at discharge. Dr YTasia Catchingsaware, no further interventions needed. Josh Borders NP and SNelwyn SalisburyPA assessed pt chairside, no further interventions needed.
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
## 2022-09-23 ENCOUNTER — Encounter: Payer: Self-pay | Admitting: Psychiatry

## 2022-09-23 ENCOUNTER — Ambulatory Visit: Payer: 59 | Admitting: Psychiatry

## 2022-09-23 VITALS — BP 159/95 | HR 73 | Temp 97.6°F | Ht 72.5 in | Wt 234.8 lb

## 2022-09-23 DIAGNOSIS — F900 Attention-deficit hyperactivity disorder, predominantly inattentive type: Secondary | ICD-10-CM

## 2022-09-23 DIAGNOSIS — F331 Major depressive disorder, recurrent, moderate: Secondary | ICD-10-CM | POA: Diagnosis not present

## 2022-09-23 MED ORDER — BUPROPION HCL ER (XL) 300 MG PO TB24: 300.0000 mg | ORAL_TABLET | Freq: Every day | ORAL | 1 refills | Status: DC

## 2022-09-23 MED ORDER — METHYLPHENIDATE HCL ER (OSM) 54 MG PO TBCR: 54.0000 mg | EXTENDED_RELEASE_TABLET | Freq: Every day | ORAL | 0 refills | Status: DC

## 2022-11-17 ENCOUNTER — Telehealth: Payer: Self-pay

## 2022-11-17 DIAGNOSIS — I10 Essential (primary) hypertension: Secondary | ICD-10-CM

## 2022-11-17 MED ORDER — LOSARTAN POTASSIUM 100 MG PO TABS: 100.0000 mg | ORAL_TABLET | Freq: Every day | ORAL | 0 refills | Status: DC

## 2022-11-17 NOTE — Addendum Note (Signed)
Addended by: Doreene Nest on: 11/17/2022 08:09 PM   Modules accepted: Orders

## 2022-11-17 NOTE — Telephone Encounter (Signed)
Received refill request for Losartan 100mg  tab
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
## 2022-11-20 ENCOUNTER — Encounter: Payer: Self-pay | Admitting: Oncology

## 2022-11-20 ENCOUNTER — Ambulatory Visit (INDEPENDENT_AMBULATORY_CARE_PROVIDER_SITE_OTHER): Payer: 59 | Admitting: Psychiatry

## 2022-11-20 ENCOUNTER — Encounter: Payer: Self-pay | Admitting: Psychiatry

## 2022-11-20 VITALS — BP 151/94 | HR 106 | Temp 98.9°F | Ht 72.5 in | Wt 225.2 lb

## 2022-11-20 DIAGNOSIS — F900 Attention-deficit hyperactivity disorder, predominantly inattentive type: Secondary | ICD-10-CM

## 2022-11-20 DIAGNOSIS — F331 Major depressive disorder, recurrent, moderate: Secondary | ICD-10-CM | POA: Diagnosis not present

## 2022-11-20 MED ORDER — METHYLPHENIDATE HCL ER (OSM) 54 MG PO TBCR: 54.0000 mg | EXTENDED_RELEASE_TABLET | Freq: Every day | ORAL | 0 refills | Status: DC

## 2022-11-20 MED ORDER — ESCITALOPRAM OXALATE 5 MG PO TABS: 5.0000 mg | ORAL_TABLET | Freq: Every day | ORAL | 2 refills | Status: DC

## 2022-11-28 ENCOUNTER — Encounter: Payer: Self-pay | Admitting: Primary Care

## 2022-11-28 ENCOUNTER — Ambulatory Visit (INDEPENDENT_AMBULATORY_CARE_PROVIDER_SITE_OTHER): Payer: Managed Care, Other (non HMO) | Admitting: Primary Care

## 2022-11-28 VITALS — BP 122/84 | HR 83 | Temp 98.2°F | Ht 72.5 in | Wt 225.0 lb

## 2022-11-28 DIAGNOSIS — Z0000 Encounter for general adult medical examination without abnormal findings: Secondary | ICD-10-CM | POA: Diagnosis not present

## 2022-11-28 DIAGNOSIS — I10 Essential (primary) hypertension: Secondary | ICD-10-CM

## 2022-11-28 DIAGNOSIS — F419 Anxiety disorder, unspecified: Secondary | ICD-10-CM

## 2022-11-28 DIAGNOSIS — K219 Gastro-esophageal reflux disease without esophagitis: Secondary | ICD-10-CM | POA: Insufficient documentation

## 2022-11-28 DIAGNOSIS — F900 Attention-deficit hyperactivity disorder, predominantly inattentive type: Secondary | ICD-10-CM

## 2022-11-28 DIAGNOSIS — M1990 Unspecified osteoarthritis, unspecified site: Secondary | ICD-10-CM | POA: Diagnosis not present

## 2022-11-28 DIAGNOSIS — M5440 Lumbago with sciatica, unspecified side: Secondary | ICD-10-CM

## 2022-11-28 DIAGNOSIS — D751 Secondary polycythemia: Secondary | ICD-10-CM

## 2022-11-28 DIAGNOSIS — G8929 Other chronic pain: Secondary | ICD-10-CM

## 2022-11-28 DIAGNOSIS — F32A Depression, unspecified: Secondary | ICD-10-CM

## 2022-11-28 DIAGNOSIS — E349 Endocrine disorder, unspecified: Secondary | ICD-10-CM

## 2022-11-28 MED ORDER — FAMOTIDINE 20 MG PO TABS
20.0000 mg | ORAL_TABLET | Freq: Every day | ORAL | 0 refills | Status: DC
Start: 2022-11-28 — End: 2023-02-26

## 2022-11-28 NOTE — Assessment & Plan Note (Signed)
Uncontrolled. Failed Tums.  Start famotidine 20 mg daily HS. He will update.

## 2022-11-28 NOTE — Assessment & Plan Note (Signed)
Following with rheumatology, spine specialist, and pain management.   Continue Soma 350 mg TID PRN, Percocet 10-325 mg every 4 hours PRN, Lyrica 150 mg TID.

## 2022-11-28 NOTE — Assessment & Plan Note (Signed)
Overall controlled.  Following with psychiatry.  Continue Concerta 54 mg daily, Wellbutrin XL 300 mg daily, Lexapro 5 mg daily.

## 2022-11-28 NOTE — Assessment & Plan Note (Signed)
Following with [facility].  Continue testosterone 200 mg every 10 days. Reviewed office notes from May 2024.

## 2022-11-28 NOTE — Assessment & Plan Note (Signed)
Following with rheumatology, spine specialist, and pain management.   Continue Soma 350 mg TID PRN, Percocet 10-325 mg every 4 hours PRN, Lyrica 150 mg TID. 

## 2022-11-28 NOTE — Assessment & Plan Note (Signed)
Following with hematology, reviewed office notes from March 2024. Continue phlebotomy as scheduled.

## 2022-11-28 NOTE — Assessment & Plan Note (Signed)
Following with psychiatry.  Continue Concerta 54 mg daily, Wellbutrin XL 300 mg daily, Lexapro 5 mg daily.

## 2022-11-28 NOTE — Assessment & Plan Note (Addendum)
Controlled.  Continue losartan 100 mg daily and carvedilol 12.5 mg BID. CMP pending for [facility]

## 2022-11-28 NOTE — Patient Instructions (Signed)
Start famotidine 20 mg at bedtime for heartburn.  Follow with your labs drawn at [facility] as discussed.  It was a pleasure to see you today!

## 2022-11-28 NOTE — Progress Notes (Signed)
Subjective:    Patient ID: Darren Mason, male    DOB: 1981/01/01, 42 y.o.   MRN: [id number]  HPI  Darren Mason is a very pleasant 42 y.o. male who presents today for complete physical and follow up of chronic conditions.  Immunizations: -Tetanus: Completed in 2023  Diet: Fair diet.  Exercise: No regular exercise.  Eye exam: Completed several years ago  Dental exam: Completes semi-annually    PSA: Due  BP Readings from Last 3 Encounters:  11/28/22 122/84  09/10/22 (!) 142/87  09/10/22 (!) 149/104         Review of Systems  Constitutional:  Negative for unexpected weight change.  HENT:  Negative for rhinorrhea.   Respiratory:  Negative for cough and shortness of breath.   Cardiovascular:  Negative for chest pain.  Gastrointestinal:  Negative for constipation and diarrhea.       GERD symptoms several times weekly, Tums not working.  Genitourinary:  Negative for difficulty urinating.  Musculoskeletal:  Positive for arthralgias, back pain and myalgias.  Skin:  Negative for rash.  Allergic/Immunologic: Negative for environmental allergies.  Neurological:  Negative for dizziness and headaches.  Psychiatric/Behavioral:  The patient is nervous/anxious.          Past Medical History:  Diagnosis Date   ADHD    Anxiety and depression    Chronic back pain    Osteoarthritis    Serotonin syndrome    Sleep apnea    CPAP machine    Testosterone deficiency     Social History   Socioeconomic History   Marital status: Married    Spouse name: Not on file   Number of children: 1   Years of education: Not on file   Highest education level: Not on file  Occupational History   Not on file  Tobacco Use   Smoking status: Former    Packs/day: 0.50    Years: 5.00    Additional pack years: 0.00    Total pack years: 2.50    Types: Cigarettes    Start date: 07/07/1993    Quit date: 07/07/1998    Years since quitting: 24.4   Smokeless tobacco: Former    Types: Chew    Tobacco comments:    Reports cutting back using gum  Vaping Use   Vaping Use: Never used  Substance and Sexual Activity   Alcohol use: Not Currently    Comment: occasionally   Drug use: No   Sexual activity: Yes    Partners: Female    Comment: wife gets injection  Other Topics Concern   Not on file  Social History Narrative   On disability from [employer].   Social Determinants of Health   Financial Resource Strain: Not on file  Food Insecurity: Not on file  Transportation Needs: Not on file  Physical Activity: Not on file  Stress: Not on file  Social Connections: Not on file  Intimate Partner Violence: Not on file    Past Surgical History:  Procedure Laterality Date   APPENDECTOMY  2001   BACK SURGERY  05/2017   BACK SURGERY  04/06/2020   stemulator placed     Family History  Problem Relation Age of Onset   Hypertension Mother    Hyperlipidemia Mother    Hypertension Father    Hyperlipidemia Father    ADD / ADHD Brother    Colon cancer Maternal Grandmother    Prostate cancer Neg Hx    Kidney cancer Neg Hx  Bladder Cancer Neg Hx     Allergies  Allergen Reactions   Nickel Rash    Current Outpatient Medications on File Prior to Visit  Medication Sig Dispense Refill   buPROPion (WELLBUTRIN XL) 300 MG 24 hr tablet Take 1 tablet (300 mg total) by mouth daily. 90 tablet 1   carisoprodol (SOMA) 350 MG tablet Take 350 mg by mouth 3 (three) times daily as needed.     carvedilol (COREG) 12.5 MG tablet TAKE 1 TABLET(12.5 MG) BY MOUTH TWICE DAILY FOR BLOOD PRESSURE 180 tablet 2   escitalopram (LEXAPRO) 5 MG tablet Take 1 tablet (5 mg total) by mouth daily. 30 tablet 2   losartan (COZAAR) 100 MG tablet Take 1 tablet (100 mg total) by mouth daily. for blood pressure. 90 tablet 0   methylphenidate (CONCERTA) 54 MG PO CR tablet Take 1 tablet (54 mg total) by mouth daily before breakfast. 30 tablet 0   [START ON 12/14/2022] methylphenidate (CONCERTA) 54 MG  PO CR tablet Take 1 tablet (54 mg total) by mouth daily before breakfast. 30 tablet 0   [START ON 01/13/2023] methylphenidate (CONCERTA) 54 MG PO CR tablet Take 1 tablet (54 mg total) by mouth daily before breakfast. 30 tablet 0   oxyCODONE-acetaminophen (PERCOCET) 10-325 MG tablet Take 1 tablet by mouth every 4 (four) hours as needed for pain. Up to 6 tablets PRN     pregabalin (LYRICA) 150 MG capsule Take 150 mg by mouth 3 (three) times daily.     PREVIDENT 5000 DRY MOUTH 1.1 % GEL dental gel USE THREE TIMES A DAY AFTER MEALS. DO NOT RINSE.    SLS FREE WILL NOT FOAM.     sildenafil (VIAGRA) 100 MG tablet TAKE 1 TABLET AS NEEDED FOR ERECTILE DYSFUNCTION 30 tablet 3   XYOSTED 75 MG/0.5ML SOAJ Inject into the skin.     Syringe, Disposable, (2-3CC SYRINGE) 3 ML MISC 1 mg by Does not apply route every 14 (fourteen) days. (Patient not taking: Reported on 11/28/2022) 25 each 3   testosterone cypionate (DEPOTESTOSTERONE CYPIONATE) 200 MG/ML injection INJECT 1 ML INTO THE MUSCLE EVERY 10 DAYS. (Patient not taking: Reported on 11/28/2022) 3 mL 1   No current facility-administered medications on file prior to visit.    BP 122/84   Pulse 83   Temp 98.2 F (36.8 C) (Temporal)   Ht 6' 0.5" (1.842 m)   Wt 225 lb (102.1 kg)   SpO2 98%   BMI 30.10 kg/m  Objective:   Physical Exam HENT:     Right Ear: Tympanic membrane and ear canal normal.     Left Ear: Tympanic membrane and ear canal normal.     Nose: Nose normal.     Right Sinus: No maxillary sinus tenderness or frontal sinus tenderness.     Left Sinus: No maxillary sinus tenderness or frontal sinus tenderness.  Eyes:     Conjunctiva/sclera: Conjunctivae normal.  Neck:     Thyroid: No thyromegaly.     Vascular: No carotid bruit.  Cardiovascular:     Rate and Rhythm: Normal rate and regular rhythm.     Heart sounds: Normal heart sounds.  Pulmonary:     Effort: Pulmonary effort is normal.     Breath sounds: Normal breath sounds. No wheezing or  rales.  Abdominal:     General: Bowel sounds are normal.     Palpations: Abdomen is soft.     Tenderness: There is no abdominal tenderness.  Musculoskeletal:  General: Normal range of motion.     Cervical back: Neck supple.  Skin:    General: Skin is warm and dry.  Neurological:     Mental Status: He is alert and oriented to person, place, and time.     Cranial Nerves: No cranial nerve deficit.     Deep Tendon Reflexes: Reflexes are normal and symmetric.  Psychiatric:        Mood and Affect: Mood normal.           Assessment & Plan:  Preventative health care Assessment & Plan: Immunizations UTD. PSA UTD  Discussed the importance of a healthy diet and regular exercise in order for weight loss, and to reduce the risk of further co-morbidity.  Exam stable. Labs pending.  Follow up in 1 year for repeat physical.    Osteoarthritis, unspecified osteoarthritis type, unspecified site Assessment & Plan: Following with rheumatology, spine specialist, and pain management.   Continue Soma 350 mg TID PRN, Percocet 10-325 mg every 4 hours PRN, Lyrica 150 mg TID.   Attention deficit hyperactivity disorder (ADHD), predominantly inattentive type Assessment & Plan: Following with psychiatry.  Continue Concerta 54 mg daily, Wellbutrin XL 300 mg daily, Lexapro 5 mg daily.    Anxiety and depression Assessment & Plan: Overall controlled.  Following with psychiatry.  Continue Concerta 54 mg daily, Wellbutrin XL 300 mg daily, Lexapro 5 mg daily.    Chronic bilateral low back pain with sciatica, sciatica laterality unspecified Assessment & Plan: Following with rheumatology, spine specialist, and pain management.   Continue Soma 350 mg TID PRN, Percocet 10-325 mg every 4 hours PRN, Lyrica 150 mg TID.   Testosterone deficiency Assessment & Plan: Following with [facility].  Continue testosterone 200 mg every 10 days. Reviewed office notes from May  2024.   Essential hypertension Assessment & Plan: Controlled.  Continue losartan 100 mg daily and carvedilol 12.5 mg BID. CMP pending for [facility]  Orders: -     Lipid panel -     Comprehensive metabolic panel  Erythrocytosis Assessment & Plan: Following with hematology, reviewed office notes from March 2024. Continue phlebotomy as scheduled.    Gastroesophageal reflux disease, unspecified whether esophagitis present Assessment & Plan: Uncontrolled. Failed Tums.  Start famotidine 20 mg daily HS. He will update.   Orders: -     Famotidine; Take 1 tablet (20 mg total) by mouth at bedtime. for heartburn.  Dispense: 90 tablet; Refill: 0        Doreene Nest, NP

## 2022-11-28 NOTE — Assessment & Plan Note (Signed)
Immunizations UTD. PSA UTD  Discussed the importance of a healthy diet and regular exercise in order for weight loss, and to reduce the risk of further co-morbidity.  Exam stable. Labs pending.  Follow up in 1 year for repeat physical.

## 2022-12-05 LAB — LIPID PANEL
Chol/HDL Ratio: 5 ratio (ref 0.0–5.0)
Cholesterol, Total: 195 mg/dL (ref 100–199)
HDL: 39 mg/dL — ABNORMAL LOW (ref >39)
LDL Chol Calc (NIH): 111 mg/dL — ABNORMAL HIGH (ref 0–99)
Triglycerides: 259 mg/dL — ABNORMAL HIGH (ref 0–149)
VLDL Cholesterol Cal: 45 mg/dL — ABNORMAL HIGH (ref 5–40)

## 2022-12-05 LAB — COMPREHENSIVE METABOLIC PANEL
ALT: 33 IU/L (ref 0–44)
AST: 24 IU/L (ref 0–40)
Albumin/Globulin Ratio: 1.6 (ref 1.2–2.2)
Albumin: 4.5 g/dL (ref 4.1–5.1)
Alkaline Phosphatase: 84 IU/L (ref 44–121)
BUN/Creatinine Ratio: 12 (ref 9–20)
BUN: 16 mg/dL (ref 6–24)
Bilirubin Total: 0.4 mg/dL (ref 0.0–1.2)
CO2: 24 mmol/L (ref 20–29)
Calcium: 9.5 mg/dL (ref 8.7–10.2)
Chloride: 98 mmol/L (ref 96–106)
Creatinine, Ser: 1.29 mg/dL — ABNORMAL HIGH (ref 0.76–1.27)
Globulin, Total: 2.9 g/dL (ref 1.5–4.5)
Glucose: 93 mg/dL (ref 70–99)
Potassium: 4.3 mmol/L (ref 3.5–5.2)
Sodium: 138 mmol/L (ref 134–144)
Total Protein: 7.4 g/dL (ref 6.0–8.5)
eGFR: 71 mL/min/{1.73_m2} (ref >59)

## 2022-12-12 ENCOUNTER — Telehealth: Payer: Self-pay

## 2022-12-12 DIAGNOSIS — F900 Attention-deficit hyperactivity disorder, predominantly inattentive type: Secondary | ICD-10-CM

## 2022-12-12 MED ORDER — METHYLPHENIDATE HCL ER (OSM) 54 MG PO TBCR
54.0000 mg | EXTENDED_RELEASE_TABLET | Freq: Every day | ORAL | 0 refills | Status: DC
Start: 2023-01-13 — End: 2022-12-15

## 2022-12-12 NOTE — Telephone Encounter (Signed)
called [facility] and canceled rxs

## 2022-12-12 NOTE — Telephone Encounter (Signed)
pt was notified that [facility] and the ones that was sent to [facility] had been canceled.

## 2022-12-12 NOTE — Telephone Encounter (Signed)
I have sent methylphenidate [facility] to [facility] [place]-[facility] supply.  Will route this message to Dr. Vanetta Shawl so the prescription which is supposed to be filled in July be changed [facility] [place].  Will have CMA contact [facility] so they cancel prescription for methylphenidate which was sent out there by Dr. Vanetta Shawl.

## 2022-12-12 NOTE — Telephone Encounter (Signed)
pt called states that he is out of methylenidate 54mg  states he took his last one today. states that also dr. Vanetta Shawl sent to the wrong pharmacy it was suppose to [facility] [place]

## 2022-12-12 NOTE — Telephone Encounter (Signed)
pt last picked up rx on 5-9.

## 2022-12-15 ENCOUNTER — Telehealth: Payer: Self-pay

## 2022-12-15 ENCOUNTER — Other Ambulatory Visit: Payer: Self-pay | Admitting: Psychiatry

## 2022-12-15 DIAGNOSIS — F900 Attention-deficit hyperactivity disorder, predominantly inattentive type: Secondary | ICD-10-CM

## 2022-12-15 MED ORDER — METHYLPHENIDATE HCL ER (OSM) 54 MG PO TBCR: 54.0000 mg | EXTENDED_RELEASE_TABLET | Freq: Every day | ORAL | 0 refills | Status: DC

## 2022-12-15 NOTE — Telephone Encounter (Signed)
pt states he does not use the [facility] anymore that rx was canceled. new rx was suppose to have been sent [facility] [place] . rx was sent but it has a note not to fill until 7-8 . he needs rx now.

## 2022-12-15 NOTE — Telephone Encounter (Signed)
Will defer to Dr.Hisada who is back on office.

## 2022-12-15 NOTE — Telephone Encounter (Signed)
Orders sent to the pharmacy

## 2022-12-16 NOTE — Telephone Encounter (Signed)
pt notified that rx sent

## 2023-01-14 ENCOUNTER — Telehealth: Payer: Self-pay

## 2023-01-14 DIAGNOSIS — I10 Essential (primary) hypertension: Secondary | ICD-10-CM

## 2023-01-14 MED ORDER — CARVEDILOL 12.5 MG PO TABS
ORAL_TABLET | ORAL | 2 refills | Status: DC
Start: 2023-01-14 — End: 2023-11-08

## 2023-01-14 NOTE — Addendum Note (Signed)
Addended by: Doreene Nest on: 01/14/2023 10:09 AM   Modules accepted: Orders

## 2023-01-14 NOTE — Telephone Encounter (Signed)
Refills sent to pharmacy. 

## 2023-01-22 ENCOUNTER — Encounter: Payer: Self-pay | Admitting: Oncology
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
## 2023-01-29 ENCOUNTER — Ambulatory Visit: Payer: 59 | Admitting: Psychiatry

## 2023-01-29 ENCOUNTER — Encounter: Payer: Self-pay | Admitting: Psychiatry

## 2023-01-29 VITALS — BP 162/117 | HR 76 | Temp 97.8°F | Ht 72.5 in | Wt 227.2 lb

## 2023-01-29 DIAGNOSIS — F331 Major depressive disorder, recurrent, moderate: Secondary | ICD-10-CM

## 2023-01-29 DIAGNOSIS — F330 Major depressive disorder, recurrent, mild: Secondary | ICD-10-CM

## 2023-01-29 DIAGNOSIS — F900 Attention-deficit hyperactivity disorder, predominantly inattentive type: Secondary | ICD-10-CM | POA: Diagnosis not present

## 2023-01-29 MED ORDER — BUPROPION HCL ER (XL) 300 MG PO TB24
300.0000 mg | ORAL_TABLET | Freq: Every day | ORAL | 1 refills | Status: DC
Start: 2023-04-01 — End: 2023-09-03

## 2023-01-29 MED ORDER — METHYLPHENIDATE HCL ER (OSM) 54 MG PO TBCR: 54.0000 mg | EXTENDED_RELEASE_TABLET | Freq: Every day | ORAL | 0 refills | Status: DC

## 2023-01-29 MED ORDER — METHYLPHENIDATE HCL ER (OSM) 54 MG PO TBCR: 54.0000 mg | EXTENDED_RELEASE_TABLET | ORAL | 0 refills | Status: DC

## 2023-01-29 MED ORDER — ESCITALOPRAM OXALATE 5 MG PO TABS: 5.0000 mg | ORAL_TABLET | Freq: Every day | ORAL | 2 refills | Status: DC

## 2023-01-29 NOTE — Patient Instructions (Signed)
Continue lexapro 5 mg daily  Continue bupropion 300 mg daily  Continue Concerta 54 mg daily Next appointment-  10/14 at 1 pm

## 2023-02-09 ENCOUNTER — Other Ambulatory Visit: Payer: Self-pay | Admitting: Primary Care

## 2023-02-09 DIAGNOSIS — I10 Essential (primary) hypertension: Secondary | ICD-10-CM

## 2023-02-25 ENCOUNTER — Other Ambulatory Visit: Payer: Self-pay | Admitting: Psychiatry

## 2023-02-26 ENCOUNTER — Other Ambulatory Visit: Payer: Self-pay | Admitting: Primary Care

## 2023-02-26 DIAGNOSIS — K219 Gastro-esophageal reflux disease without esophagitis: Secondary | ICD-10-CM

## 2023-03-13 ENCOUNTER — Inpatient Hospital Stay: Payer: Managed Care, Other (non HMO)

## 2023-03-13 ENCOUNTER — Inpatient Hospital Stay: Payer: Managed Care, Other (non HMO) | Attending: Oncology

## 2023-03-13 DIAGNOSIS — D751 Secondary polycythemia: Secondary | ICD-10-CM | POA: Diagnosis present

## 2023-03-13 LAB — HEMOGLOBIN AND HEMATOCRIT (CANCER CENTER ONLY)
HCT: 49.5 % (ref 39.0–52.0)
Hemoglobin: 16.1 g/dL (ref 13.0–17.0)

## 2023-03-13 NOTE — Progress Notes (Signed)
Hct 49.5. NO phlebotomy needed today
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
## 2023-04-20 ENCOUNTER — Ambulatory Visit: Payer: 59 | Admitting: Psychiatry

## 2023-04-20 ENCOUNTER — Encounter: Payer: Self-pay | Admitting: Psychiatry

## 2023-04-20 VITALS — BP 150/107 | HR 93 | Temp 99.0°F | Ht 72.5 in | Wt 225.2 lb

## 2023-04-20 DIAGNOSIS — F419 Anxiety disorder, unspecified: Secondary | ICD-10-CM | POA: Diagnosis not present

## 2023-04-20 DIAGNOSIS — F900 Attention-deficit hyperactivity disorder, predominantly inattentive type: Secondary | ICD-10-CM

## 2023-04-20 DIAGNOSIS — F330 Major depressive disorder, recurrent, mild: Secondary | ICD-10-CM | POA: Diagnosis not present

## 2023-04-20 MED ORDER — ESCITALOPRAM OXALATE 10 MG PO TABS: 10.0000 mg | ORAL_TABLET | Freq: Every day | ORAL | 0 refills | Status: DC

## 2023-04-20 MED ORDER — METHYLPHENIDATE HCL ER (OSM) 54 MG PO TBCR: 54.0000 mg | EXTENDED_RELEASE_TABLET | Freq: Every day | ORAL | 0 refills | Status: DC

## 2023-04-20 NOTE — Patient Instructions (Addendum)
Increase lexapro 10 mg daily  Continue bupropion 300 mg daily  Continue Concerta 54 mg daily Next appointment-  12/12 at 1:30  Generally, the light box should: Emit full-spectrum light with either fluorescent or LED bulbs (fluorescent is usually recommended) Provide an exposure to 10,000 lux of light Produce as little UV light as possible  Typical recommendations include using the light box: Within the 30 mins to first hour of waking up in the morning For about 20 to 30 minutes About 16 to 24 inches (41 to 61 centimeters) from your face, but follow the manufacturer's instructions about distance With eyes open, but not looking directly at the light  Noted that Light boxes aren't regulated by the Food and Drug Administration (FDA) for SAD (seasonal affective disorder) treatment.
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
## 2023-06-18 ENCOUNTER — Encounter: Payer: Self-pay | Admitting: Psychiatry

## 2023-06-18 ENCOUNTER — Ambulatory Visit: Payer: 59 | Admitting: Psychiatry

## 2023-06-18 VITALS — BP 190/129 | HR 103 | Temp 98.0°F | Ht 72.5 in | Wt 230.2 lb

## 2023-06-18 DIAGNOSIS — F419 Anxiety disorder, unspecified: Secondary | ICD-10-CM

## 2023-06-18 DIAGNOSIS — F900 Attention-deficit hyperactivity disorder, predominantly inattentive type: Secondary | ICD-10-CM

## 2023-06-18 DIAGNOSIS — F330 Major depressive disorder, recurrent, mild: Secondary | ICD-10-CM | POA: Diagnosis not present

## 2023-06-18 MED ORDER — METHYLPHENIDATE HCL ER (OSM) 54 MG PO TBCR: 54.0000 mg | EXTENDED_RELEASE_TABLET | Freq: Every day | ORAL | 0 refills | Status: DC

## 2023-06-18 MED ORDER — ESCITALOPRAM OXALATE 10 MG PO TABS: 10.0000 mg | ORAL_TABLET | Freq: Every day | ORAL | 0 refills | Status: DC

## 2023-06-18 NOTE — Patient Instructions (Signed)
Continue lexapro 10 mg daily Continue bupropion 300 mg daily  Continue Concerta 54 mg daily Next appointment-  2/27 at 1 pm

## 2023-08-11 ENCOUNTER — Other Ambulatory Visit: Payer: Self-pay | Admitting: Primary Care

## 2023-08-27 ENCOUNTER — Encounter: Payer: Self-pay | Admitting: Oncology
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
## 2023-09-03 ENCOUNTER — Encounter: Payer: Self-pay | Admitting: Psychiatry

## 2023-09-03 ENCOUNTER — Ambulatory Visit: Payer: 59 | Admitting: Psychiatry

## 2023-09-03 VITALS — BP 130/88 | HR 82 | Temp 97.3°F | Ht 72.5 in | Wt 218.2 lb

## 2023-09-03 DIAGNOSIS — F330 Major depressive disorder, recurrent, mild: Secondary | ICD-10-CM | POA: Diagnosis not present

## 2023-09-03 DIAGNOSIS — F419 Anxiety disorder, unspecified: Secondary | ICD-10-CM | POA: Diagnosis not present

## 2023-09-03 DIAGNOSIS — F900 Attention-deficit hyperactivity disorder, predominantly inattentive type: Secondary | ICD-10-CM

## 2023-09-03 DIAGNOSIS — F331 Major depressive disorder, recurrent, moderate: Secondary | ICD-10-CM

## 2023-09-03 DIAGNOSIS — F32A Depression, unspecified: Secondary | ICD-10-CM

## 2023-09-03 MED ORDER — METHYLPHENIDATE HCL ER (OSM) 54 MG PO TBCR: 54.0000 mg | EXTENDED_RELEASE_TABLET | Freq: Every day | ORAL | 0 refills | Status: DC

## 2023-09-03 MED ORDER — BUPROPION HCL ER (XL) 300 MG PO TB24: 300.0000 mg | ORAL_TABLET | Freq: Every day | ORAL | 1 refills | Status: DC

## 2023-09-03 MED ORDER — ESCITALOPRAM OXALATE 10 MG PO TABS: 10.0000 mg | ORAL_TABLET | Freq: Every day | ORAL | 0 refills | Status: DC

## 2023-09-03 MED ORDER — METHYLPHENIDATE HCL ER (OSM) 54 MG PO TBCR: 54.0000 mg | EXTENDED_RELEASE_TABLET | ORAL | 0 refills | Status: DC

## 2023-09-03 NOTE — Patient Instructions (Signed)
 Continue lexapro 10 mg daily  Continue bupropion 300 mg daily  Continue Concerta 54 mg daily Next appointment-  5/22 at 1 PM

## 2023-09-10 ENCOUNTER — Encounter: Payer: Self-pay | Admitting: Oncology

## 2023-09-10 ENCOUNTER — Inpatient Hospital Stay: Payer: Self-pay | Admitting: Oncology

## 2023-09-10 ENCOUNTER — Inpatient Hospital Stay: Payer: Self-pay

## 2023-09-10 ENCOUNTER — Inpatient Hospital Stay: Payer: Self-pay | Attending: Oncology

## 2023-09-10 VITALS — BP 140/83 | HR 77 | Temp 96.6°F | Resp 20 | Wt 224.1 lb

## 2023-09-10 DIAGNOSIS — Z800 Family history of malignant neoplasm of digestive organs: Secondary | ICD-10-CM | POA: Insufficient documentation

## 2023-09-10 DIAGNOSIS — D751 Secondary polycythemia: Secondary | ICD-10-CM

## 2023-09-10 DIAGNOSIS — Z87891 Personal history of nicotine dependence: Secondary | ICD-10-CM | POA: Diagnosis not present

## 2023-09-10 DIAGNOSIS — G4730 Sleep apnea, unspecified: Secondary | ICD-10-CM | POA: Diagnosis not present

## 2023-09-10 LAB — CBC WITH DIFFERENTIAL (CANCER CENTER ONLY)
Abs Immature Granulocytes: 0.02 10*3/uL (ref 0.00–0.07)
Basophils Absolute: 0.1 10*3/uL (ref 0.0–0.1)
Basophils Relative: 1 %
Eosinophils Absolute: 0.3 10*3/uL (ref 0.0–0.5)
Eosinophils Relative: 4 %
HCT: 48.8 % (ref 39.0–52.0)
Hemoglobin: 16.1 g/dL (ref 13.0–17.0)
Immature Granulocytes: 0 %
Lymphocytes Relative: 28 %
Lymphs Abs: 1.7 10*3/uL (ref 0.7–4.0)
MCH: 29.6 pg (ref 26.0–34.0)
MCHC: 33 g/dL (ref 30.0–36.0)
MCV: 89.7 fL (ref 80.0–100.0)
Monocytes Absolute: 0.5 10*3/uL (ref 0.1–1.0)
Monocytes Relative: 8 %
Neutro Abs: 3.7 10*3/uL (ref 1.7–7.7)
Neutrophils Relative %: 59 %
Platelet Count: 213 10*3/uL (ref 150–400)
RBC: 5.44 MIL/uL (ref 4.22–5.81)
RDW: 13.4 % (ref 11.5–15.5)
WBC Count: 6.3 10*3/uL (ref 4.0–10.5)
nRBC: 0 % (ref 0.0–0.2)

## 2023-09-10 NOTE — Assessment & Plan Note (Addendum)
 Erythrocytosis, this is new onset.  Likely secondary to chronic testosterone use. JAK2 V617F mutation negative, with reflex to other mutations CALR, MPL, JAK 2 Ex 12-15 mutations negative.  Secondary erythrocytosis, recommend phlebotomy to keep hematocrit less than 50 so that he may stay on testosterone treatments safely.  He agrees. Hct <50 Hold off phlebotomy today.

## 2023-09-10 NOTE — Progress Notes (Signed)
 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  Likely secondary to chronic testosterone use. JAK2 V617F mutation negative, with reflex to other mutations CALR, MPL, JAK 2 Ex 12-15 mutations negative.  Secondary erythrocytosis, recommend phlebotomy to keep hematocrit less than 50 so that he may stay on testosterone treatments safely.  He agrees. Hct <50 Hold off phlebotomy today. Given that patient's hemoglobin/hematocrit levels have been stable for the past year.  Recommend patient to continue follow-up with primary care provider.  He can reestablish care if hematocrit persistently above 50. All questions were answered. The patient knows to call the clinic with any problems, questions or concerns.  Darren Patience, MD, PhD [facility] Hematology Oncology 09/10/2023   HISTORY OF PRESENTING ILLNESS:   Darren Mason is a  43 y.o.  male with PMH listed below was seen in consultation at the request of  Doreene Nest, NP  for evaluation of /erythrocytosis/elevated hemoglobin,   Patient has abnormal CBC with hemoglobin of 18.5 on 05/16/2022. Reviewed patient's previous labs.  Elevated of hemoglobin is new.  Patient denies unintentional weight loss, fever, chills, fatigue, night sweats.  Patient is a former smoker.  Currently he does not smoke.  He is on testosterone replacement for 10 years. No other new complaints.   INTERVAL HISTORY Darren Mason is a 43 y.o. 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 Patient reports feeling well today.  He is on testosterone replacement therapy.  No new complaints.  MEDICAL HISTORY:  Past Medical History:  Diagnosis Date   ADHD    Anxiety and depression    Chronic back  pain    Osteoarthritis    Serotonin syndrome    Sleep apnea    CPAP machine    Testosterone deficiency     SURGICAL HISTORY: Past Surgical History:  Procedure Laterality Date   APPENDECTOMY  2001   BACK SURGERY  05/2017   BACK SURGERY  04/06/2020   stemulator placed     SOCIAL HISTORY: Social History   Socioeconomic History   Marital status: Married    Spouse name: Not on file   Number of children: 1   Years of education: Not on file   Highest education level: Not on file  Occupational History   Not on file  Tobacco Use   Smoking status: Former    Current packs/day: 0.00    Average packs/day: 0.5 packs/day for 5.0 years (2.5 ttl pk-yrs)    Types: Cigarettes    Start date: 07/07/1993    Quit date: 07/07/1998    Years since quitting: 25.1   Smokeless tobacco: Former    Types: Chew   Tobacco comments:    Reports cutting back using gum  Vaping Use   Vaping status: Never Used  Substance and Sexual Activity   Alcohol use: Not Currently    Comment: occasionally   Drug use: No   Sexual activity: Yes    Partners: Female    Comment: wife gets injection  Other Topics Concern   Not on file  Social History Narrative   On disability from [employer].   Social Drivers of Corporate investment banker Strain: Not on file  Food Insecurity: Not on file  Transportation Needs: Not on file  Physical  Activity: Not on file  Stress: Not on file  Social Connections: Unknown (10/25/2022)   Received from [facility]   Social Network    Social Network: Not on file  Intimate Partner Violence: Unknown (10/25/2022)   Received from [facility]   HITS    Physically Hurt: Not on file    Insult or Talk Down To: Not on file    Threaten Physical Harm: Not on file    Scream or Curse: Not on file    FAMILY HISTORY: Family History  Problem Relation Age of Onset   Hypertension Mother    Hyperlipidemia Mother    Hypertension Father     Hyperlipidemia Father    ADD / ADHD Brother    Colon cancer Maternal Grandmother    Prostate cancer Neg Hx    Kidney cancer Neg Hx    Bladder Cancer Neg Hx     ALLERGIES:  is allergic to nickel.  MEDICATIONS:  Current Outpatient Medications  Medication Sig Dispense Refill   [START ON 09/28/2023] buPROPion (WELLBUTRIN XL) 300 MG 24 hr tablet Take 1 tablet (300 mg total) by mouth daily. 90 tablet 1   carisoprodol (SOMA) 350 MG tablet Take 350 mg by mouth 3 (three) times daily as needed.     carvedilol (COREG) 12.5 MG tablet TAKE 1 TABLET(12.5 MG) BY MOUTH TWICE DAILY FOR BLOOD PRESSURE 180 tablet 2   [START ON 10/17/2023] escitalopram (LEXAPRO) 10 MG tablet Take 1 tablet (10 mg total) by mouth daily. 90 tablet 0   famotidine (PEPCID) 20 MG tablet TAKE 1 TABLET(20 MG) BY MOUTH AT BEDTIME FOR HEARTBURN 90 tablet 2   losartan (COZAAR) 100 MG tablet TAKE 1 TABLET(100 MG) BY MOUTH DAILY FOR BLOOD PRESSURE 90 tablet 2   methylphenidate (CONCERTA) 54 MG PO CR tablet Take 1 tablet (54 mg total) by mouth daily before breakfast. 30 tablet 0   methylphenidate (CONCERTA) 54 MG PO CR tablet Take 1 tablet (54 mg total) by mouth daily before breakfast. 30 tablet 0   [START ON 09/11/2023] methylphenidate (CONCERTA) 54 MG PO CR tablet Take 1 tablet (54 mg total) by mouth daily before breakfast. 30 tablet 0   [START ON 10/11/2023] methylphenidate (CONCERTA) 54 MG PO CR tablet Take 1 tablet (54 mg total) by mouth daily before breakfast. 30 tablet 0   [START ON 11/10/2023] methylphenidate (CONCERTA) 54 MG PO CR tablet Take 1 tablet (54 mg total) by mouth every morning. 30 tablet 0   oxyCODONE-acetaminophen (PERCOCET) 10-325 MG tablet Take 1 tablet by mouth every 4 (four) hours as needed for pain. Up to 6 tablets PRN     pregabalin (LYRICA) 150 MG capsule Take 150 mg by mouth 3 (three) times daily.     PREVIDENT 5000 DRY MOUTH 1.1 % GEL dental gel USE THREE TIMES A DAY AFTER MEALS. DO NOT RINSE.    SLS FREE WILL NOT  FOAM.     sildenafil (VIAGRA) 100 MG tablet TAKE 1 TABLET AS NEEDED FOR ERECTILE DYSFUNCTION 30 tablet 3   Syringe, Disposable, (2-3CC SYRINGE) 3 ML MISC 1 mg by Does not apply route every 14 (fourteen) days. 25 each 3   testosterone cypionate (DEPOTESTOSTERONE CYPIONATE) 200 MG/ML injection INJECT 1 ML INTO THE MUSCLE EVERY 10 DAYS. 3 mL 1   XYOSTED 75 MG/0.5ML SOAJ Inject into the skin.     No current facility-administered medications for this visit.     Review of Systems  Constitutional:  Negative for  appetite change, chills, fatigue, fever and unexpected weight change.  HENT:   Negative for hearing loss and voice change.   Eyes:  Negative for eye problems and icterus.  Respiratory:  Negative for chest tightness, cough and shortness of breath.   Cardiovascular:  Negative for chest pain and leg swelling.  Gastrointestinal:  Negative for abdominal distention and abdominal pain.  Endocrine: Negative for hot flashes.  Genitourinary:  Negative for difficulty urinating, dysuria and frequency.   Musculoskeletal:  Positive for back pain. Negative for arthralgias.  Skin:  Negative for itching and rash.  Neurological:  Negative for light-headedness and numbness.  Hematological:  Negative for adenopathy. Does not bruise/bleed easily.  Psychiatric/Behavioral:  Negative for confusion.     PHYSICAL EXAMINATION: ECOG PERFORMANCE STATUS: 0 - Asymptomatic Vitals:   09/10/23 1414  BP: (!) 140/83  Pulse: 77  Resp: 20  Temp: (!) 96.6 F (35.9 C)  SpO2: 100%   Filed Weights   09/10/23 1414  Weight: 224 lb 1.6 oz (101.7 kg)    Physical Exam Constitutional:      General: He is not in acute distress. HENT:     Head: Normocephalic and atraumatic.  Eyes:     General: No scleral icterus. Cardiovascular:     Rate and Rhythm: Normal rate and regular rhythm.     Heart sounds: Normal heart sounds.  Pulmonary:     Effort: Pulmonary effort is normal. No respiratory distress.     Breath  sounds: No wheezing.  Abdominal:     General: Bowel sounds are normal. There is no distension.     Palpations: Abdomen is soft.  Musculoskeletal:        General: No deformity. Normal range of motion.     Cervical back: Normal range of motion and neck supple.  Skin:    General: Skin is warm and dry.     Findings: No erythema or rash.  Neurological:     Mental Status: He is alert and oriented to person, place, and time. Mental status is at baseline.     Cranial Nerves: No cranial nerve deficit.     Coordination: Coordination normal.  Psychiatric:        Mood and Affect: Mood normal.     LABORATORY DATA:  I have reviewed the data as listed    Latest Ref Rng & Units 09/10/2023    1:31 PM 03/13/2023   12:47 PM 09/10/2022    2:09 PM  CBC  WBC 4.0 - 10.5 K/uL 6.3   5.3   Hemoglobin 13.0 - 17.0 g/dL 40.9  81.1  91.4   Hematocrit 39.0 - 52.0 % 48.8  49.5  50.9   Platelets 150 - 400 K/uL 213   228       Latest Ref Rng & Units 12/04/2022    9:43 AM 05/28/2022   12:24 PM 01/25/2020   10:57 AM  CMP  Glucose 70 - 99 mg/dL 93  782    BUN 6 - 24 mg/dL 16  16    Creatinine 9.56 - 1.27 mg/dL 2.13  0.86    Sodium 578 - 144 mmol/L 138  135    Potassium 3.5 - 5.2 mmol/L 4.3  4.4    Chloride 96 - 106 mmol/L 98  103    CO2 20 - 29 mmol/L 24  28    Calcium 8.7 - 10.2 mg/dL 9.5  9.1    Total Protein 6.0 - 8.5 g/dL 7.4  7.6  8.2  Total Bilirubin 0.0 - 1.2 mg/dL 0.4  0.6  0.4   Alkaline Phos 44 - 121 IU/L 84  55  92   AST 0 - 40 IU/L 24  27  28    ALT 0 - 44 IU/L 33  47  66      RADIOGRAPHIC STUDIES: I have personally reviewed the radiological images as listed and agreed with the findings in the report. No results found.

## 2023-10-20 ENCOUNTER — Other Ambulatory Visit: Payer: Self-pay | Admitting: Primary Care

## 2023-10-20 DIAGNOSIS — I10 Essential (primary) hypertension: Secondary | ICD-10-CM

## 2023-10-20 NOTE — Telephone Encounter (Unsigned)
 Copied from CRM [id number]. Topic: Clinical - Medication Refill >> Oct 20, 2023 10:38 AM Alyse July wrote: Most Recent Primary Care Visit:  Provider: CLARK, KATHERINE K  Department: [facility]  Visit Type: PHYSICAL  Date: 11/28/2022  Medication: losartan (COZAAR) 100 MG tablet  Has the patient contacted their pharmacy? Yes (Agent: If no, request that the patient contact the pharmacy for the refill. If patient does not wish to contact the pharmacy document the reason why and proceed with request.) (Agent: If yes, when and what did the pharmacy advise?)  Is this the correct pharmacy for this prescription? Yes If no, delete pharmacy and type the correct one.  This is the patient's preferred pharmacy:   [id number] [place], [place] - [address] AT [facility] [address] [place] [address] Phone: [phone] Fax: [phone]   Has the prescription been filled recently? No  Is the patient out of the medication? Yes  Has the patient been seen for an appointment in the last year OR does the patient have an upcoming appointment? Yes  Can we respond through MyChart? Yes, would prefer a phone call  Agent: Please be advised that Rx refills may take up to 3 business days. We ask that you follow-up with your pharmacy.

## 2023-10-21 MED ORDER — LOSARTAN POTASSIUM 100 MG PO TABS: 100.0000 mg | ORAL_TABLET | Freq: Every day | ORAL | 0 refills | Status: DC

## 2023-11-08 ENCOUNTER — Other Ambulatory Visit: Payer: Self-pay | Admitting: Primary Care

## 2023-11-08 DIAGNOSIS — I10 Essential (primary) hypertension: Secondary | ICD-10-CM

## 2023-11-18 ENCOUNTER — Other Ambulatory Visit: Payer: Self-pay

## 2023-11-18 DIAGNOSIS — K219 Gastro-esophageal reflux disease without esophagitis: Secondary | ICD-10-CM

## 2023-11-19 MED ORDER — FAMOTIDINE 20 MG PO TABS: 20.0000 mg | ORAL_TABLET | Freq: Every day | ORAL | 0 refills | Status: DC
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
## 2023-11-25 ENCOUNTER — Ambulatory Visit: Payer: Self-pay | Admitting: Psychiatry

## 2023-11-25 ENCOUNTER — Ambulatory Visit (INDEPENDENT_AMBULATORY_CARE_PROVIDER_SITE_OTHER): Admitting: Psychology

## 2023-11-25 DIAGNOSIS — F89 Unspecified disorder of psychological development: Secondary | ICD-10-CM | POA: Diagnosis not present

## 2023-11-25 LAB — TSH: TSH: 1.06 u[IU]/mL (ref 0.450–4.500)

## 2023-11-25 NOTE — Progress Notes (Signed)
 Date: 11/25/2023 Appointment Start Time: 1:03pm (pt experienced technical issues so the meetign was switched to Microsoft Teams around 1:05pm) Duration: 100 total minutes Provider: Maryetta Sneddon, PsyD Type of Session: Initial Appointment for Evaluation  Location of Patient: Home Location of Provider: Provider's Home (private office) Type of Contact: Microsoft Teams video visit with audio and Caregility video visit with audio  Session Content:  Prior to proceeding with today's appointment, two pieces of identifying information were obtained from [place] to verify identity. In addition, Zakiah's physical location at the time of this appointment was obtained. In the event of technical difficulties, Paulmichael shared a phone number he could be reached at. Jachob and this provider participated in today's telepsychological service. Robt denied anyone else being present in the room or on the virtual appointment.  The provider's role was explained to Heeia. The provider reviewed and discussed issues of confidentiality, privacy, and limits therein (e.g., reporting obligations). In addition to verbal informed consent, written informed consent for psychological services was obtained from [place] prior to the initial appointment. Written consent included information concerning the practice, financial arrangements, and confidentiality and patients' rights. Since the clinic is not a 24/7 crisis center, mental health emergency resources were shared, and the provider explained e-mail, voicemail, and/or other messaging systems should be utilized only for non-emergency reasons. This provider also explained that information obtained during appointments will be placed in their electronic medical record in a confidential manner. Khi verbally acknowledged understanding of the aforementioned and agreed to use mental health emergency resources discussed if needed. Moreover, Johnedward agreed information may be shared with other [facility] or their referring provider(s) as needed for coordination of care. By signing the new patient documents, Pradeep provided written consent for coordination of care. Dequarius verbally acknowledged understanding he is ultimately responsible for understanding his insurance benefits as it relates to reimbursement of telepsychological and in-person services. This provider also reviewed confidentiality, as it relates to telepsychological services, as well as the rationale for telepsychological services. This provider further explained that video should not be captured, photos should not be taken, nor should testing stimuli be copied or recorded as it would be a copyright violation. Alfonzo expressed understanding of the aforementioned, and verbally consented to proceed. Jayquan is aware of the limitations of teleheath visits and verbally consented to proceed.  Jule completed the neurobehavioral examination, which included obtaining a, family, social, and psychiatric history; integration of prior history and other sources of clinical data to assist with clinical decision making; behavioral observations; assessment of thinking, reasoning, and judgment; and establishment of a provisional diagnosis. The evaluation was completed in 100 minutes. Codes [id number] and [id number] were billed.   Mental Status Examination:  Appearance:  neat Behavior: appropriate to circumstances Mood: neutral Affect: mood congruent Speech: tangential  Eye Contact: appropriate Psychomotor Activity: restless Thought Process: denies suicidal, homicidal, and self-harm ideation, plan and intent Content/Perceptual Disturbances: none Orientation: AAOx4 Cognition/Sensorium: intact Insight: fair Judgment: good  Provisional DSM-5 diagnosis(es):  F89 Unspecified Disorder of Psychological Development   Plan: Testing is expected to answer the question, does the individual meet criteria for ADHD when age, other mental health concerns (e.g.,  anxiety and depression), and cognitive functioning are taken into consideration? Further testing is warranted because a diagnosis cannot be given solely based on current interview data (further data is required). Testing results are expected to answer the remaining diagnostic questions in order to provide an accurate diagnosis and assist in treatment planning with an expectation of improved clinical outcome.  Jaxx is currently scheduled for an appointment on 12/04/2023 at 1pm via Caregility video visit with audio.                 Veronica Gordon, PsyD

## 2023-11-26 ENCOUNTER — Ambulatory Visit: Payer: 59 | Admitting: Psychiatry

## 2023-11-26 ENCOUNTER — Encounter: Payer: Self-pay | Admitting: Psychiatry

## 2023-11-26 VITALS — BP 144/90 | HR 88 | Temp 98.3°F | Ht 72.05 in | Wt 224.0 lb

## 2023-11-26 DIAGNOSIS — F900 Attention-deficit hyperactivity disorder, predominantly inattentive type: Secondary | ICD-10-CM

## 2023-11-26 DIAGNOSIS — F330 Major depressive disorder, recurrent, mild: Secondary | ICD-10-CM | POA: Diagnosis not present

## 2023-11-26 MED ORDER — METHYLPHENIDATE HCL ER (OSM) 54 MG PO TBCR: 54.0000 mg | EXTENDED_RELEASE_TABLET | ORAL | 0 refills | Status: DC

## 2023-11-26 MED ORDER — METHYLPHENIDATE HCL ER (OSM) 54 MG PO TBCR
54.0000 mg | EXTENDED_RELEASE_TABLET | Freq: Every day | ORAL | 0 refills | Status: DC
Start: 2024-01-09 — End: 2024-01-12

## 2023-11-26 NOTE — Patient Instructions (Signed)
 Continue lexapro  10 mg daily Continue bupropion  300 mg daily  Continue Concerta  54 mg daily Next appointment-  7/8 at 1 pm

## 2023-12-01 ENCOUNTER — Encounter: Payer: Self-pay | Admitting: Primary Care

## 2023-12-01 ENCOUNTER — Ambulatory Visit (INDEPENDENT_AMBULATORY_CARE_PROVIDER_SITE_OTHER): Payer: Managed Care, Other (non HMO) | Admitting: Primary Care

## 2023-12-01 VITALS — BP 108/64 | HR 98 | Temp 97.2°F | Ht 72.0 in | Wt 220.0 lb

## 2023-12-01 DIAGNOSIS — G8929 Other chronic pain: Secondary | ICD-10-CM

## 2023-12-01 DIAGNOSIS — M5441 Lumbago with sciatica, right side: Secondary | ICD-10-CM

## 2023-12-01 DIAGNOSIS — E349 Endocrine disorder, unspecified: Secondary | ICD-10-CM

## 2023-12-01 DIAGNOSIS — F32A Depression, unspecified: Secondary | ICD-10-CM

## 2023-12-01 DIAGNOSIS — F900 Attention-deficit hyperactivity disorder, predominantly inattentive type: Secondary | ICD-10-CM

## 2023-12-01 DIAGNOSIS — M1990 Unspecified osteoarthritis, unspecified site: Secondary | ICD-10-CM

## 2023-12-01 DIAGNOSIS — I10 Essential (primary) hypertension: Secondary | ICD-10-CM | POA: Diagnosis not present

## 2023-12-01 DIAGNOSIS — K219 Gastro-esophageal reflux disease without esophagitis: Secondary | ICD-10-CM | POA: Diagnosis not present

## 2023-12-01 DIAGNOSIS — Z0000 Encounter for general adult medical examination without abnormal findings: Secondary | ICD-10-CM

## 2023-12-01 DIAGNOSIS — M5442 Lumbago with sciatica, left side: Secondary | ICD-10-CM

## 2023-12-01 DIAGNOSIS — F419 Anxiety disorder, unspecified: Secondary | ICD-10-CM

## 2023-12-01 NOTE — Assessment & Plan Note (Signed)
 Deteriorated.   Increase famotidine  40 mg daily. He will take 2 of the 20 mg pills and notify when nearing the end of his bottle.

## 2023-12-01 NOTE — Assessment & Plan Note (Signed)
 Controlled.  Continue carvedilol  12.5 mg BID, losartan  100 mg daily.  CMP pending.

## 2023-12-01 NOTE — Assessment & Plan Note (Signed)
 Stable. Following with psychiatry, office notes reviewed from May 2025.  Continue bupropion  XL 300 mg daily, Lexapro  10 mg daily.

## 2023-12-01 NOTE — Assessment & Plan Note (Signed)
Immunizations UTD.  Discussed the importance of a healthy diet and regular exercise in order for weight loss, and to reduce the risk of further co-morbidity.  Exam stable. Labs pending.  Follow up in 1 year for repeat physical.  

## 2023-12-01 NOTE — Assessment & Plan Note (Signed)
 Deteriorated for patient. Following with psychiatry.  Reviewed office notes from May 2025.  He will inquire with his psychiatrist regarding his Concerta  dose.  Continue Concerta  54 mg daily for now.

## 2023-12-01 NOTE — Patient Instructions (Signed)
 Stop by the lab prior to leaving today. I will notify you of your results once received.   It was a pleasure to see you today!

## 2023-12-01 NOTE — Assessment & Plan Note (Signed)
 Following with rheumatology and pain management.  Continue Soma  350 mg TID PRN, Percocet 10-325 mg every 4 hours as needed, Lyrica 150 mg 3 times daily.

## 2023-12-01 NOTE — Progress Notes (Signed)
 Subjective:    Patient ID: Darren Mason, male    DOB: [birth year], 43 y.o.   MRN: [id number]  HPI  Darren Mason is a very pleasant 43 y.o. male who presents today for complete physical and follow up of chronic conditions.  Immunizations: -Tetanus: Completed in 2023  Diet: Fair diet.  Exercise: No regular exercise.  Eye exam: Completed years ago  Dental exam: Completes semi-annually     BP Readings from Last 3 Encounters:  12/01/23 108/64  11/26/23 (!) 144/90  09/10/23 (!) 140/83         Review of Systems  Constitutional:  Negative for unexpected weight change.  HENT:  Negative for rhinorrhea.   Respiratory:  Negative for cough and shortness of breath.   Cardiovascular:  Negative for chest pain.  Gastrointestinal:  Negative for constipation and diarrhea.  Genitourinary:  Negative for difficulty urinating.  Musculoskeletal:  Positive for arthralgias and back pain.  Skin:  Negative for rash.  Allergic/Immunologic: Negative for environmental allergies.  Neurological:  Negative for dizziness and headaches.  Psychiatric/Behavioral:  Positive for decreased concentration.          Past Medical History:  Diagnosis Date   ADHD    Anxiety and depression    Chronic back pain    Osteoarthritis    Serotonin syndrome    Sleep apnea    CPAP machine    Testosterone  deficiency     Social History   Socioeconomic History   Marital status: Married    Spouse name: Not on file   Number of children: 1   Years of education: Not on file   Highest education level: Not on file  Occupational History   Not on file  Tobacco Use   Smoking status: Former    Current packs/day: 0.00    Average packs/day: 0.5 packs/day for 5.0 years (2.5 ttl pk-yrs)    Types: Cigarettes    Start date: 07/07/1993    Quit date: 07/07/1998    Years since quitting: 25.4   Smokeless tobacco: Former    Types: Chew   Tobacco comments:    Reports cutting back using gum  Vaping Use   Vaping status:  Never Used  Substance and Sexual Activity   Alcohol use: Not Currently    Comment: occasionally   Drug use: No   Sexual activity: Yes    Partners: Female    Comment: wife gets injection  Other Topics Concern   Not on file  Social History Narrative   On disability from [employer].   Social Drivers of Corporate investment banker Strain: Not on file  Food Insecurity: Not on file  Transportation Needs: Not on file  Physical Activity: Not on file  Stress: Not on file  Social Connections: Unknown (10/25/2022)   Received from [facility]   Social Network    Social Network: Not on file  Intimate Partner Violence: Unknown (10/25/2022)   Received from [facility]   HITS    Physically Hurt: Not on file    Insult or Talk Down To: Not on file    Threaten Physical Harm: Not on file    Scream or Curse: Not on file    Past Surgical History:  Procedure Laterality Date   APPENDECTOMY  2001   BACK SURGERY  05/2017   BACK SURGERY  04/06/2020   stemulator placed     Family History  Problem Relation Age of Onset   Hypertension Mother  Hyperlipidemia Mother    Hypertension Father    Hyperlipidemia Father    ADD / ADHD Brother    Colon cancer Maternal Grandmother    Prostate cancer Neg Hx    Kidney cancer Neg Hx    Bladder Cancer Neg Hx     Allergies  Allergen Reactions   Nickel Rash    Current Outpatient Medications on File Prior to Visit  Medication Sig Dispense Refill   buPROPion  (WELLBUTRIN  XL) 300 MG 24 hr tablet Take 1 tablet (300 mg total) by mouth daily. 90 tablet 1   carisoprodol  (SOMA ) 350 MG tablet Take 350 mg by mouth 3 (three) times daily as needed.     carvedilol  (COREG ) 12.5 MG tablet TAKE 1 TABLET(12.5 MG) BY MOUTH TWICE DAILY FOR BLOOD PRESSURE 180 tablet 0   escitalopram  (LEXAPRO ) 10 MG tablet Take 1 tablet (10 mg total) by mouth daily. 90 tablet 0   famotidine  (PEPCID ) 20 MG tablet Take 1 tablet (20 mg total)  by mouth daily. for heartburn. 90 tablet 0   losartan  (COZAAR ) 100 MG tablet Take 1 tablet (100 mg total) by mouth daily. for blood pressure. 90 tablet 0   [START ON 12/10/2023] methylphenidate  (CONCERTA ) 54 MG PO CR tablet Take 1 tablet (54 mg total) by mouth every morning. 30 tablet 0   [START ON 01/09/2024] methylphenidate  (CONCERTA ) 54 MG PO CR tablet Take 1 tablet (54 mg total) by mouth daily before breakfast. 30 tablet 0   oxyCODONE -acetaminophen  (PERCOCET) 10-325 MG tablet Take 1 tablet by mouth every 4 (four) hours as needed for pain. Up to 6 tablets PRN     pregabalin (LYRICA) 150 MG capsule Take 150 mg by mouth 3 (three) times daily.     PREVIDENT 5000 DRY MOUTH 1.1 % GEL dental gel USE THREE TIMES A DAY AFTER MEALS. DO NOT RINSE.    SLS FREE WILL NOT FOAM.     sildenafil  (VIAGRA ) 100 MG tablet TAKE 1 TABLET AS NEEDED FOR ERECTILE DYSFUNCTION 30 tablet 3   Syringe, Disposable, (2-3CC SYRINGE) 3 ML MISC 1 mg by Does not apply route every 14 (fourteen) days. 25 each 3   XYOSTED 100 MG/0.5ML SOAJ INJECT 1 SUBCUTANEOUS EVERY WEEK     No current facility-administered medications on file prior to visit.    BP 108/64   Pulse 98   Temp (!) 97.2 F (36.2 C) (Temporal)   Ht 6' (1.829 m)   Wt 220 lb (99.8 kg)   SpO2 100%   BMI 29.84 kg/m  Objective:   Physical Exam HENT:     Right Ear: Tympanic membrane and ear canal normal.     Left Ear: Tympanic membrane and ear canal normal.  Eyes:     Pupils: Pupils are equal, round, and reactive to light.  Cardiovascular:     Rate and Rhythm: Normal rate and regular rhythm.  Pulmonary:     Effort: Pulmonary effort is normal.     Breath sounds: Normal breath sounds.  Abdominal:     General: Bowel sounds are normal.     Palpations: Abdomen is soft.     Tenderness: There is no abdominal tenderness.  Musculoskeletal:        General: Normal range of motion.     Cervical back: Neck supple.  Skin:    General: Skin is warm and dry.  Neurological:      Mental Status: He is alert and oriented to person, place, and time.     Cranial Nerves:  No cranial nerve deficit.     Deep Tendon Reflexes:     Reflex Scores:      Patellar reflexes are 2+ on the right side and 2+ on the left side. Psychiatric:        Mood and Affect: Mood normal.           Assessment & Plan:  Preventative health care Assessment & Plan: Immunizations UTD.  Discussed the importance of a healthy diet and regular exercise in order for weight loss, and to reduce the risk of further co-morbidity.  Exam stable. Labs pending.  Follow up in 1 year for repeat physical.    Essential hypertension Assessment & Plan: Controlled.  Continue carvedilol  12.5 mg BID, losartan  100 mg daily.  CMP pending.   Orders: -     Comprehensive metabolic panel with GFR -     Lipid panel  Gastroesophageal reflux disease, unspecified whether esophagitis present Assessment & Plan: Deteriorated.   Increase famotidine  40 mg daily. He will take 2 of the 20 mg pills and notify when nearing the end of his bottle.     Osteoarthritis, unspecified osteoarthritis type, unspecified site Assessment & Plan: Following with rheumatology and pain management.  Continue Soma  350 mg TID PRN, Percocet 10-325 mg every 4 hours as needed, Lyrica 150 mg 3 times daily.   Attention deficit hyperactivity disorder (ADHD), predominantly inattentive type Assessment & Plan: Deteriorated for patient. Following with psychiatry.  Reviewed office notes from May 2025.  He will inquire with his psychiatrist regarding his Concerta  dose.  Continue Concerta  54 mg daily for now.   Anxiety and depression Assessment & Plan: Stable. Following with psychiatry, office notes reviewed from May 2025.  Continue bupropion  XL 300 mg daily, Lexapro  10 mg daily.   Chronic bilateral low back pain with sciatica, sciatica laterality unspecified Assessment & Plan: Following with rheumatology and pain  management.  Continue Soma  350 mg TID PRN, Percocet 10-325 mg every 4 hours as needed, Lyrica 150 mg 3 times daily.   Testosterone  deficiency Assessment & Plan: Controlled.  Following with urology. Continue Xyosted weekly.         Caylon Saine K Dulcie Gammon, NP

## 2023-12-01 NOTE — Assessment & Plan Note (Signed)
 Controlled.  Following with urology. Continue Xyosted weekly.

## 2023-12-02 ENCOUNTER — Ambulatory Visit: Payer: Self-pay | Admitting: Primary Care

## 2023-12-02 LAB — COMPREHENSIVE METABOLIC PANEL WITH GFR
ALT: 30 IU/L (ref 0–44)
AST: 19 IU/L (ref 0–40)
Albumin: 4.7 g/dL (ref 4.1–5.1)
Alkaline Phosphatase: 77 IU/L (ref 44–121)
BUN/Creatinine Ratio: 10 (ref 9–20)
BUN: 14 mg/dL (ref 6–24)
Bilirubin Total: 0.4 mg/dL (ref 0.0–1.2)
CO2: 21 mmol/L (ref 20–29)
Calcium: 9.8 mg/dL (ref 8.7–10.2)
Chloride: 101 mmol/L (ref 96–106)
Creatinine, Ser: 1.38 mg/dL — ABNORMAL HIGH (ref 0.76–1.27)
Globulin, Total: 2.7 g/dL (ref 1.5–4.5)
Glucose: 118 mg/dL — ABNORMAL HIGH (ref 70–99)
Potassium: 4.8 mmol/L (ref 3.5–5.2)
Sodium: 140 mmol/L (ref 134–144)
Total Protein: 7.4 g/dL (ref 6.0–8.5)
eGFR: 65 mL/min/{1.73_m2} (ref >59)

## 2023-12-02 LAB — LIPID PANEL
Chol/HDL Ratio: 5.2 ratio — ABNORMAL HIGH (ref 0.0–5.0)
Cholesterol, Total: 219 mg/dL — ABNORMAL HIGH (ref 100–199)
HDL: 42 mg/dL (ref >39)
LDL Chol Calc (NIH): 154 mg/dL — ABNORMAL HIGH (ref 0–99)
Triglycerides: 129 mg/dL (ref 0–149)
VLDL Cholesterol Cal: 23 mg/dL (ref 5–40)
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
## 2023-12-04 ENCOUNTER — Ambulatory Visit (INDEPENDENT_AMBULATORY_CARE_PROVIDER_SITE_OTHER): Admitting: Psychology

## 2023-12-04 DIAGNOSIS — F89 Unspecified disorder of psychological development: Secondary | ICD-10-CM

## 2023-12-09 ENCOUNTER — Ambulatory Visit: Admitting: Psychology

## 2023-12-09 DIAGNOSIS — F902 Attention-deficit hyperactivity disorder, combined type: Secondary | ICD-10-CM | POA: Diagnosis not present

## 2023-12-09 DIAGNOSIS — F332 Major depressive disorder, recurrent severe without psychotic features: Secondary | ICD-10-CM | POA: Diagnosis not present

## 2023-12-09 DIAGNOSIS — F411 Generalized anxiety disorder: Secondary | ICD-10-CM

## 2023-12-09 NOTE — Progress Notes (Unsigned)
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 memory, planning and organizing his approach to problem-solving appropriately, being cautious in his approach to tasks, checking for mistakes, and keeping materials and belongings reasonably well-organized. He did not describe his ability to be aware of his functioning in social settings, adjust well to changes, and react to events appropriately as problematic; However, the Shift scale approached an abnormal elevation.    Scale/Index  Raw Score T Score Percentile Qualitative Description  Inhibit 15 68 98 Mildly Elevated  Self-Monitor 7 49 65 Within Normal Limits  Behavior Regulation Index (BRI) 22 61 88 Approaching an Elevation  Shift 11 62 89 Approaching an Elevation  Emotional Control 9 46 57 Within Normal Limits  Emotion Regulation Index (ERI) 20 54 70 Within Normal Limits  Initiate 23 88 >99 Highly Elevated  Working Memory 21 85 >99 Highly Elevated  Plan/Organize 20 80 >99 Highly Elevated  Task Monitor 13 73 99 Moderately Elevated  Organization of Materials 18 71 96 Moderately Elevated  Cognitive Regulation Index (CRI) 95 84 >99 Highly Elevated  Global Executive Composite (GEC) 137 74 99 Moderately Elevated   Validity Scale Raw Score Cumulative Percentile Protocol Classification  Inconsistency 0 98 Acceptable  Negativity 0 98 Acceptable  Infrequency 0 98 Acceptable   Behavior Rating Inventory of Executive Function, Second  Edition Event organiser) Informant:  Mr. Face spouse, Jameon Deller, completed the Informant Form of the Behavior Rating Inventory of Executive Function-Adult Version, Second Edition [employer]), which is equivalent to the Self-Report version and has three domains that evaluate cognitive, behavioral, and emotional regulation, and a Global Executive Composite score provides an overall snapshot of executive functioning. There are no missing item responses in the protocol. The Negativity, Infrequency, and Inconsistency scales are not elevated, suggesting she did not respond to the protocol in an overly negative, haphazard, extreme, or inconsistent manner. In the context of these validity considerations, Ms. Pohl's ratings of Mr. Sanville's everyday executive function suggest some areas of concern. The overall index score, the GEC, was highly elevated (GEC T = 75, %ile = 99). The Behavior Regulation Index (BRI) and Emotion Regulation Index (ERI) scores were within normal limits (BRI T = 61, %ile = 86; ERI T = 60, %ile = 84), but the Cognitive Regulation Index (CRI) score was highly elevated (CRI T = 81, %ile = >99). Ms. Chamberlin indicated Mr. Depolo experiences difficulty with his ability to resist impulses, get going on tasks and activities and independently generate ideas, sustain working memory, plan and organize their approach to problem solving appropriately, be appropriately cautious in their approach to tasks and check for mistakes, and keep materials and belongings reasonably well-organized. She did not describe his ability to be aware of their functioning in social settings,  adjust well to changes, and react to events appropriately as problematic. However, the Shift scale approached an abnormal elevation.   Scale/Index  Raw Score T Score Percentile Qualitative Description  Inhibit 15 66 95 Mildly Elevated  Self-Monitor 8 51 70 Within Normal Limits  Behavior Regulation Index (BRI) 23 61 86 Approaching an Elevation   Shift 12 63 97 Approaching an Elevation  Emotional Control 12 53 73 Within Normal Limits  Emotion Regulation Index (ERI) 24 60 84 Approaching an Elevation  Initiate 19 77 >99 Highly Elevated  Working Memory 20 82 >99 Highly Elevated  Plan/Organize 20 80 >99 Highly Elevated  Task-Monitor 13 67 >99 Mildly Elevated  Organization of Materials 18 68 96 Mildly Elevated  Cognitive Regulation Index (CRI) 90 81 >99 Highly Elevated  Global Executive Composite (GEC) 137 75 99 Highly Elevated   Validity Scale Raw Score Cumulative Percentile Protocol Classification  Inconsistency 5 99 Acceptable  Negativity 0 98 Acceptable  Infrequency 0 98 Acceptable   BEHAVIORAL FUNCTIONING   Patient Health Questionnaire-9 (PHQ-9): Mr. Zaino completed the PHQ-9, a self-report measure that assesses symptoms of depression. He scored 20/27, which indicates severe depression.   Generalized Anxiety Disorder-7 (GAD-7): Mr. Wilner completed the GAD-7, a self-report measure that assesses symptoms of anxiety. He scored 12/21, which indicates moderate anxiety.   Adult ADHD Self-Report Scale Symptom Checklist (ASRS): Mr. Gregson reported the following symptoms as sometimes: interrupting others when they are busy. He endorsed the following symptoms as occurring often: difficulty getting things in order when a task requires organization, feeling overly active and compelled to do things, making careless mistakes when working on boring or complex projects, leaving his seat when expected to stay seated, feeling restless or fidgety, and difficulty relaxing. He endorsed the following symptoms as very often: difficulty wrapping up the final details of a project following the completion of challenging aspects, problems remembering appointments or obligations, avoiding or delaying getting started on tasks requiring a lot of thought, fidgeting or squirming, struggling to sustain attention when doing boring or repetitive work, struggling to  concentrate on what people say even when they are speaking directly to him, misplacing or has difficulty finding things, being distracted by the noise around him, and talking too much in social situations. The endorsement of at least four items in Part A is highly consistent with ADHD in adults. The frequency scores of Part B provide additional cues. Mr. Kinker scored 6/6 on Part A and 10/12 on Part B, which is considered a positive screening for ADHD.   Personality Assessment Inventory (PAI): The PAI is an objective inventory of adult personality. The validity indicators suggested Mr. Mannella's profile is interpretable (ICN T = 40, INF T = 44, NIM T = 55, and PIM T = 48). He is endorsing significant concerns about somatic functioning and problem impairment arising from them (SOM T = 79 and SOM-H T =  73), with the concerns including symptoms of sensory and/or motor dysfunctions (SOM-C T = 72) and frequent occurrence of various common physical symptoms (SOM-S T = 81); being somewhat rigid in attitudes and behaviors (ARD-O T = 60); unhappiness at least part of the time (DEP T = 67), anhedonia (DEP-A T = 64), and vegetative signs of depression (e.g., sleep problems, appetite issues, and/or lack of drive; DEP-P T = 72) that may at least partially be attributable to him indicating having probably experienced a traumatic event(s) that continue to be a source of distress (ARD-T T = 65); a loosening of  associations and increased difficulties in self-expression and communication (SCZ-T T = 78); and historical problems with authority and social convention (ANT-A T = 61). He indicated having close, generally supportive relationships with friends and family (NON T = 48) and appears to acknowledge the need to make some changes, has a positive attitude about the possibility of personal change, and accepts the importance of personal responsibility (RXR T = 44).     SUMMARY AND CLINICAL IMPRESSIONS: Mr. Kolbie Lepkowski is a  43 year old male who Dr. Todd Fossa referred for an evaluation to determine if he currently meets criteria for a diagnosis of Attention-Deficit/Hyperactivity Disorder (ADHD).   Mr. Carter reported he was diagnosed with ADHD approximately 10-15 years ago and that he is currently utilizing methylphenidate , but that upon requesting an increase in dosage, Dr. Edda Goo recommended he pursue an ADHD evaluation. He expressed a belief he is "probably a textbook case of ADHD" and that his symptoms have become exacerbated as he has "g[otten] older," which has led to his wife and son commenting on it.   Mr. Cowie was administered assessments during the evaluation to measure his current cognitive abilities. His verbal comprehension abilities were in the above-average range, and he demonstrated the strongest performance on the Vocabulary subtest, which required him to explain the meanings of words presented in isolation. Additionally, performance on this subtest requires the ability to verbalize meaningful concepts and retrieve information from long-term memory. His lowest performance was on the Similarities subtest, which measured his ability to abstract meaningful concepts and relationships from verbally presented material. His ability to sustain attention, concentrate, and exert mental control was in the below-average range, which suggests a general weakness in attention, concentration, mental control, and short-term auditory memory. His strongest performance was on the Running Digits subtest, and his lowest performance was on the Digits Forward subtest. This performance pattern suggests Mr. Hase may display better working memory ability when tasks are somewhat more effortful. It may also indicate a relatively stronger ability to register auditory material when presented at a quicker pace, more similar to the speed of speech, as opposed to a slower pace. Results of the CNS Vital Signs indicated a low neurocognitive  processing ability. However, he demonstrated impairments in complex attention, cognitive flexibility, executive function, simple attention, motor speed, and reaction time, as well as weaknesses in visual memory and psychomotor speed. It is also important to note that Mr. Bastidas reported having utilized methylphenidate  on the days of the CNS Vital Signs and WAIS-5 administrations. As such, his results are indicative of his cognitive performance during use of methylphenidate .   During the clinical interview and on self-report measures, Mr. Retherford endorsed executive functioning concerns that include attentional dysregulation, hyperactivity and impulsivity-related symptoms, and meeting the full criteria for ADHD. Moreover, his spouse, Ms. Brock Larmon, also indicated perceiving him as experiencing multiple significant executive functioning concerns. When considering self-reported symptoms as well as Ms. Scholle's perception of his executive functioning abilities; endorsed and/or demonstrated impairment or weakness on measures of attention, executive functioning, reaction time, visual memory, psychomotor speed, and working memory; him having reportedly been diagnosed with ADHD previously; and a reported familial history of ADHD, a diagnosis of F90.2 Attention-Deficit/Hyperactivity Disorder, Combined Presentation, Moderate appears warranted. The specifier of "Moderate" as he endorsed symptoms over what is needed to make the diagnosis and indicated they cause impairment in his academic (e.g., having significantly worse academic performance in classes he was not interested in, a tendency to waiting until the last minute to start  and complete assignments, and commonly having trouble sustaining his attention during class), occupational (e.g., being prone to tardiness), social (e.g., frequently engaging in excessive talking and interrupting of others), and daily (e.g., regularly experiencing being easily distracted, task  initiation and completion issues, disorganization, and forgetfulness) functioning.   Mr. Casanas also endorsed a history of depressive episodes; generalized anxiety; fluctuating between hyposomnia and hypersomnia as well as regularly experiencing tossing and turning in his sleep; overeating-related behaviors; and trauma- and stressor-related disorder symptomatology that he attributed to two traumatic events that occurred during his teenage years. As such, the PHQ-9, GAD-7, and PAI were administered. His results suggest he experiences severe depression- and moderate anxiety-related symptoms. When considering his self-reported symptoms as well as chart review information, he appears to meet full criteria for F33.2 Major Depressive Disorder, Recurrent, Severe, and F41.1 Generalized Anxiety Disorder. Thus, the diagnoses were assigned. However, given the limited scope of this evaluation, it was unable to be determined if full criteria for sleep-wake disorder, eating disorder, and trauma- and stressor-related disorder are met, or if his diagnoses of ADHD, MDD, and GAD better explain his endorsed symptoms. Should any of the aforementioned be ruled in, they would likely be in addition to his diagnosis of ADHD, as he described his ADHD-related concerns as occurring before some of the concerns and endorsed trauma, as consistent across situations, and occurring independently of mood.  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 Kerney would likely benefit from ongoing consultation regarding medication for ADHD symptoms.   Individual therapeutic services may assist in processing a diagnosis of ADHD and discussing coping and compensatory strategies. Mr. Stmartin would likely benefit from making use of strategies for ADHD symptoms:  Setting a timer to  complete tasks. Break tasks into manageable chunks and spread them out over more extended periods with breaks.  Utilizing lists and day calendars to keep track of tasks.  Answering emails daily.  Improve listening skills by asking the speaker to give information in smaller chunks and asking for explanations and clarification as needed. Leaving more than the anticipated time to complete tasks. It may help to keep tasks brief, well within your attention span, and a mix of both high and low-interest tasks. Tasks may be gradually increased in length. Practice proactive planning by setting aside time every evening to plan for the next day (e.g., prepare needed materials or pack the car the night before).  Learn how to make a practical and reasonable "to-do" list of important tasks and priorities and always keep it easily accessible. Make additional copies in case it is lost or misplaced. Utilize visual reminders by posting appointments, "to-do lists," or schedules in strategic areas at home and work.  Practice using an appointment book, smartphone, or other tech device, or a daily planning calendar, and learn to write down appointments and commitments immediately. Keep notepads or use a portable audio recorder to capture important ideas that would be beneficial to recall later. Learn and practice time management skills. Purchase a programmable alarm watch or set an alarm on a smartphone to avoid losing track of time.  Use a color-coded file system, desk and closet organizers, storage boxes, or other organization devices to reduce clutter and improve efficiency and structure.  Implement ways to become more aware of your actions and to inhibit or adjust them as warranted (e.g., reviewing videos of your actions, considering consequences of obeying or not obeying  the rules of various upcoming situations, having a trusted other to discuss plans with, and/or provide cues to stop certain behaviors, and make visual  cues for rules you would like to follow). The 4Rs: Read just one paragraph, recite out loud in a soft voice or whisper what was important in that material, write that material down in a notebook, then review what you just wrote. Stay flexible and be prepared to change your plans, as symptom breakthroughs and crises will likely occur periodically. Mr. Botto may benefit from mindfulness training to address symptoms of inattention.  Mental alertness/energy can be raised by increasing exercise; improving sleep; eating a healthy diet; and managing depression and anxiety. Consulting with a physician regarding any changes to the physical regimen is recommended. "Failing at Normal: An ADHD Success Story" by Beulah Brunt is a great overview of ADHD. Dr. Magdalena Scholz also has a YouTube channel called, "Magdalena Scholz, PhD - Dedicated to ADHD Science+" with helpful videos on ADHD-related topics: [email or web address]  Applications:  RescueTime. Tracks your activities on your phone and/or computer to determine how productive you have been and what distracted you. Free two-week trial.  [email or web address] . It uses engineered audio that may reduce distractions and assist with focus. Free 15-day trial. Freedom. Allows you to highlight days and times you want to block yourself from certain sites or apps. Free trial. [employer].  It allows you to input your bank accounts and creates a visual layout of information about your financial goals, budget management, alerts, etc. May offer a free trial. Boomerang. It allows you to schedule times an email is sent and to see if others have received or opened your email. Ten messages free per month and a free trial of the premium version. IFTTT. Uses "channels" to create various actions (e.g., if you are mentioned in an email, highlight it in your inbox, and if you miss a call, add it to a to-do list). Free and premium versions. Unroll.me. Cleans up your email by  unsubscribing from what you do not want to receive while still getting everything you do. Free. Finish. It allows you to divide two-list tasks into short-term, mid-term, and long-term tasks and determine how much time is left for a task. Focus mode hides non-priority tasks.  [employer]ilent. Turns your phone ringer on and off based on specified calendars, geo-fences, timers, etc. $3.99. Freakyalarm. It makes you solve math problems to disable an alarm. $1.99. Wake N Shake. It makes you vigorously shake your phone to stop the alarm. $.99. Todoist. It allows you to add sub-tasks to tasks and includes email and web plugins to make it work across the system. Premium has location-based reminders, calendar sync, productive tracking, etc.  Sleep Cycle. Utilize your phone's motion sensors to catch movement while you are asleep. The alarm will wake you as early as 30 minutes before your alarm based on your lightest sleep phase and show you how daily activities affect your sleep quality.  Books: "Taking Charge of Adult ADHD Second Edition" by Dr. Magdalena Scholz "The ADHD Effect on Marriage" by Easter Golden "The Couples Guide to Thriving with ADHD" by Easter Golden Organizations that are a reliable source of information on ADHD:  Children and Adults with Attention-Deficit/Hyperactivity Disorder (CHADD): chadd.org  Attention Deficit Disorder Association (ADDA): HotterNames.de ADD Resources: addresources.org ADD WareHouse: addwarehouse.com World Federation of ADHD: adhd-federation.org ADDConsults: addconsults.com Compilation of ADHD resources: https://www.harrell.com/ Future evaluation, if deemed necessary, and/or to determine the effectiveness of recommended interventions.   Maryetta Sneddon, Psy.D. Licensed [name]/ranger -  HSP-P [phone]   References  Oralia Bills. A. (2021). Taking charge of adult ADHD: proven strategies to succeed at work, at   home, and in relationships (pp. 6-10  and 272-276). [employer].             Veronica Gordon, PsyD

## 2023-12-22 ENCOUNTER — Encounter: Payer: Self-pay | Admitting: Oncology
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
## 2024-01-10 ENCOUNTER — Other Ambulatory Visit: Payer: Self-pay | Admitting: Primary Care

## 2024-01-10 DIAGNOSIS — I10 Essential (primary) hypertension: Secondary | ICD-10-CM

## 2024-01-12 ENCOUNTER — Ambulatory Visit (INDEPENDENT_AMBULATORY_CARE_PROVIDER_SITE_OTHER): Admitting: Psychiatry

## 2024-01-12 ENCOUNTER — Encounter: Payer: Self-pay | Admitting: Psychiatry

## 2024-01-12 ENCOUNTER — Telehealth: Payer: Self-pay

## 2024-01-12 ENCOUNTER — Other Ambulatory Visit: Payer: Self-pay

## 2024-01-12 ENCOUNTER — Encounter: Payer: Self-pay | Admitting: Oncology

## 2024-01-12 VITALS — BP 136/89 | HR 85 | Temp 98.2°F | Ht 72.0 in | Wt 227.4 lb

## 2024-01-12 DIAGNOSIS — F902 Attention-deficit hyperactivity disorder, combined type: Secondary | ICD-10-CM

## 2024-01-12 DIAGNOSIS — F3341 Major depressive disorder, recurrent, in partial remission: Secondary | ICD-10-CM

## 2024-01-12 MED ORDER — METHYLPHENIDATE HCL ER (OSM) 36 MG PO TBCR: 72.0000 mg | EXTENDED_RELEASE_TABLET | Freq: Every day | ORAL | 0 refills | Status: DC

## 2024-01-12 MED ORDER — ESCITALOPRAM OXALATE 10 MG PO TABS: 10.0000 mg | ORAL_TABLET | Freq: Every day | ORAL | 0 refills | Status: DC

## 2024-01-12 NOTE — Patient Instructions (Signed)
 Continue lexapro  10 mg daily  Continue bupropion  300 mg daily  Continue Concerta  72 mg daily Referral to therapy, [facility] First Next appointment-  8/19 at 2:30

## 2024-01-12 NOTE — Telephone Encounter (Signed)
 Medication management - Message left for patient, after he left one of concern [facility] methylphenidate  36 mg but did not hear it was for 2 a day as he had agreed and requested increase with Dr. Vickey. Informed patient on generic voice message left this nurse [facility] was [facility] and filled for 36 mg, 2 a day for a total of 72 mg a day.  Informed the prescription was ready for pick up, per pharmacist and to call our office back if any further issues or concerns.

## 2024-01-28 ENCOUNTER — Other Ambulatory Visit: Payer: Self-pay

## 2024-01-28 DIAGNOSIS — K219 Gastro-esophageal reflux disease without esophagitis: Secondary | ICD-10-CM

## 2024-01-28 DIAGNOSIS — I10 Essential (primary) hypertension: Secondary | ICD-10-CM

## 2024-01-28 MED ORDER — CARVEDILOL 12.5 MG PO TABS
ORAL_TABLET | ORAL | 2 refills | Status: AC
Start: 2024-01-28 — End: ?

## 2024-01-28 MED ORDER — FAMOTIDINE 20 MG PO TABS: 20.0000 mg | ORAL_TABLET | Freq: Every day | ORAL | 2 refills | Status: AC

## 2024-02-02 ENCOUNTER — Telehealth: Payer: Self-pay | Admitting: Primary Care

## 2024-02-02 NOTE — Telephone Encounter (Signed)
Unable to reach patient. Unable to leave voicemail.  

## 2024-02-02 NOTE — Telephone Encounter (Unsigned)
 Copied from CRM [id number]. Topic: Clinical - Prescription Issue >> Feb 02, 2024 11:47 AM Gennette ORN wrote: Reason for CRM: Corean from [facility] [phone]  famotidine  (PEPCID ) 20 MG tablet carvedilol  (COREG ) 12.5 MG tablet is calling about the refills for this patient. Patient is requesting more refills but says no refills left.

## 2024-02-03 NOTE — Telephone Encounter (Signed)
 Unable to reach patient. Left voicemail to return call to our office.

## 2024-02-04 NOTE — Telephone Encounter (Signed)
 Spoke with pt he has [facility]. Does not need refilled at [facility]

## 2024-02-15 NOTE — Progress Notes (Unsigned)
 No show

## 2024-02-23 ENCOUNTER — Ambulatory Visit (INDEPENDENT_AMBULATORY_CARE_PROVIDER_SITE_OTHER): Admitting: Psychiatry

## 2024-02-23 DIAGNOSIS — Z91199 Patient's noncompliance with other medical treatment and regimen due to unspecified reason: Secondary | ICD-10-CM

## 2024-02-24 ENCOUNTER — Ambulatory Visit (INDEPENDENT_AMBULATORY_CARE_PROVIDER_SITE_OTHER): Admitting: Clinical

## 2024-02-24 DIAGNOSIS — F331 Major depressive disorder, recurrent, moderate: Secondary | ICD-10-CM | POA: Diagnosis not present

## 2024-02-24 DIAGNOSIS — F909 Attention-deficit hyperactivity disorder, unspecified type: Secondary | ICD-10-CM

## 2024-02-24 NOTE — Progress Notes (Signed)
   Darice Seats, LCSW
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
## 2024-03-01 ENCOUNTER — Other Ambulatory Visit: Payer: Self-pay

## 2024-03-01 ENCOUNTER — Ambulatory Visit (INDEPENDENT_AMBULATORY_CARE_PROVIDER_SITE_OTHER): Admitting: Psychiatry

## 2024-03-01 ENCOUNTER — Encounter: Payer: Self-pay | Admitting: Psychiatry

## 2024-03-01 VITALS — BP 138/94 | HR 83 | Temp 97.4°F | Ht 72.0 in | Wt 227.6 lb

## 2024-03-01 DIAGNOSIS — F909 Attention-deficit hyperactivity disorder, unspecified type: Secondary | ICD-10-CM

## 2024-03-01 DIAGNOSIS — F330 Major depressive disorder, recurrent, mild: Secondary | ICD-10-CM | POA: Diagnosis not present

## 2024-03-01 NOTE — Patient Instructions (Signed)
 Continue lexapro  10 mg daily  Continue bupropion  300 mg daily  Continue Concerta  72 mg daily  Referral to therapy, [facility] Next appointment-  10/23 at 3 pm,

## 2024-03-12 ENCOUNTER — Encounter: Payer: Self-pay | Admitting: Oncology

## 2024-03-14 ENCOUNTER — Other Ambulatory Visit: Payer: Self-pay | Admitting: Psychiatry

## 2024-03-14 MED ORDER — METHYLPHENIDATE HCL ER (OSM) 36 MG PO TBCR: 72.0000 mg | EXTENDED_RELEASE_TABLET | Freq: Every day | ORAL | 0 refills | Status: DC

## 2024-03-14 NOTE — Telephone Encounter (Signed)
 Refill of concerta  is sent to the pharmacy.

## 2024-03-29 ENCOUNTER — Ambulatory Visit (INDEPENDENT_AMBULATORY_CARE_PROVIDER_SITE_OTHER): Admitting: Clinical

## 2024-03-29 ENCOUNTER — Encounter: Payer: Self-pay | Admitting: Oncology

## 2024-03-29 DIAGNOSIS — F909 Attention-deficit hyperactivity disorder, unspecified type: Secondary | ICD-10-CM | POA: Diagnosis not present

## 2024-03-29 DIAGNOSIS — F331 Major depressive disorder, recurrent, moderate: Secondary | ICD-10-CM | POA: Diagnosis not present

## 2024-03-29 NOTE — Progress Notes (Signed)
 [facility] Counselor/Therapist Progress Note  Patient ID: Darren Mason, MRN: [id number]    Date: 03/29/24  Time Spent: 10:45  am - 11:29 am : 44 Minutes  Treatment Type: Individual Therapy.  Reported Symptoms: psychomotor retardation, fluctuation in appetite  Mental Status Exam: Appearance:  Neat and Well Groomed     Behavior: Appropriate  Motor: Normal  Speech/Language:  Clear and Coherent and Normal Rate  Affect: Appropriate  Mood: normal  Thought process: normal  Thought content:   WNL  Sensory/Perceptual disturbances:   Patient reported back pain  Orientation: oriented to person, place, and situation  Attention: Good  Concentration: Good  Memory: WNL  Fund of knowledge:  Good  Insight:   Good  Judgment:  Good  Impulse Control: Good   Risk Assessment: Danger to Self:  No Patient denied current suicidal ideation  Self-injurious Behavior: No Danger to Others: No Patient denied current homicidal ideation Duty to Warn:no Physical Aggression / Violence:No  Access to Firearms a concern: No  Gang Involvement:No   Subjective:  Patient stated, everything's pretty much the same in response to events since last session. Patient reported experiencing back pain today. Patient reported mentally I've been doing good in response to mood since last session. Patient stated, my mood's good in response to current mood. Patient stated, I feel like I'm moving in slow motion and reported fluctuations in appetite. Patient stated, I think some of its the pain too, when you're in pain you tend to lose your appetite. Patient reported no questions related to diagnoses. Patient stated, that's good in response to participation in therapy and stated, I think two weeks is good in response to frequency of sessions. Patient stated, I don't know if I have a really good answer as far as goals. Patient reported a history of individual therapy to address family stressors and  chronic pain. Patient stated, its been a lot to deal with in reference to family dynamics/relationships. Patient reported feeling used by family members.   Interventions: Motivational Interviewing. Clinician conducted session in person at clinician's office at [facility]. Reviewed events since last session and assessed for changes. Clinician reviewed diagnoses and treatment recommendations. Provided psycho education related to diagnoses and treatment. Clinician utilized motivational interviewing to explore potential goals for therapy.   Collaboration of Care: Other not required at this time  Diagnosis:  Major depressive disorder, recurrent episode, moderate (HCC)  Attention deficit hyperactivity disorder (ADHD), unspecified ADHD type   Plan: Goals/target dates to be developed during follow up appointment on 04/26/24 due to patient experiencing difficulty identifying goals for therapy.               Darice Seats, LCSW
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
## 2024-04-26 ENCOUNTER — Ambulatory Visit: Admitting: Clinical

## 2024-04-26 DIAGNOSIS — F331 Major depressive disorder, recurrent, moderate: Secondary | ICD-10-CM

## 2024-04-26 DIAGNOSIS — F909 Attention-deficit hyperactivity disorder, unspecified type: Secondary | ICD-10-CM

## 2024-04-26 NOTE — Progress Notes (Unsigned)
 [facility] Counselor/Therapist Progress Note  Patient ID: Darren Mason, MRN: [id number]    Date: 04/26/24  Time Spent: 12:41  pm - 1:27 pm : 46 Minutes  Treatment Type: Individual Therapy.  Reported Symptoms: negative self talk, easliy distracted  Mental Status Exam: Appearance:  Neat and Well Groomed     Behavior: Appropriate  Motor: Normal  Speech/Language:  Clear and Coherent and Normal Rate  Affect: Appropriate  Mood: normal  Thought process: normal  Thought content:   WNL  Sensory/Perceptual disturbances:   WNL  Orientation: oriented to person, place, time/date, and situation  Attention: Good  Concentration: Good  Memory: WNL  Fund of knowledge:  Good  Insight:   Good  Judgment:  Good  Impulse Control: Good   Risk Assessment: Danger to Self:  No Patient denied current suicidal ideation  Self-injurious Behavior: No Danger to Others: No Patient denied current homicidal ideation Duty to Warn:no Physical Aggression / Violence:No  Access to Firearms a concern: No  Gang Involvement:No   Subjective:  Things have been good in resposne to events since last session. Patient reported no changes since last session. Patient reported increase soreness and achiness due to cold weather. Carolynn are always challenging for me to stay motivation stay active. Patient stated, better in resposne to mood since last session. Patient stated, it helps when I have a lot going on.  Discussed goals for therapy - it seems as I get older I get a little more cynical, more positive. Patient reported sadness regarding patient's family not being present. I always think the worst, that helps me prepare for whatever comes. Pt reported being easily distracted in session. Patient reproted chrounic pain can impact sleep and fatigue. Patient reported feeling frustrated when in pain and experiencing difficulty completing a task and an activity patient wants to particpate in. I think  it's good in resposne to goals for therapy. Returned to counseling to obtain a different perspective.   Interventions: Motivational Interviewing. Clinician conducted session in person at clinician's office at [facility]. Reviewed events since last session and assessed for changes. Clinician utilized motivational interviewing to explore potential goals for therapy. Clinician utilized a task centered approach in collaboration with patient to develop goals for therapy. Patient participated in development of goals and agreed to goals for therapy.   Diagnosis:  No diagnosis found.   Plan: ***Patient is to utilize [employer], thought re-framing, relaxation techniques, mindfulness and coping strategies to decrease symptoms associated with their diagnosis. Frequency: bi-weekly  Modality: individual     Long-term goal:   Reduce overall level, frequency, and intensity of the feelings of depression as evidenced by depressed mood, low energy, fatigue, loss of interest, psychomotor retardation, fluctuations in sleep, decreased concentration, negative self talk from 7 days/week to 1 to 2 days/week per patient's report for at least 3 consecutive months.  Target Date: 04/26/25  Progress: established 04/26/24   Short-term goal:  Identify, challenge, and replace negative thought patterns, such as, catastrophizing, and negative self talk that contribute to feelings of depression and frustration with positive thoughts, beliefs, and positive self talk per patient's report Target Date: 04/26/25  Progress: established 04/26/24   Practice mindfulness exercises daily  Target Date: 04/26/25  Progress: established 04/26/24   Decrease feelings of frustration by practicing coping strategies Target Date: 04/26/25  Progress: established 04/26/24   Begin a healthy grieving process as it relates to loss of relationship with mother, brother, sister in law, niece Target Date: 04/26/25  Progress: established 04/26/24                      Darice Seats, LCSW

## 2024-04-27 ENCOUNTER — Encounter: Payer: Self-pay | Admitting: Clinical

## 2024-04-28 ENCOUNTER — Ambulatory Visit: Admitting: Psychiatry

## 2024-04-28 ENCOUNTER — Encounter: Payer: Self-pay | Admitting: Psychiatry

## 2024-04-28 ENCOUNTER — Other Ambulatory Visit: Payer: Self-pay

## 2024-04-28 VITALS — BP 130/85 | HR 80 | Temp 97.4°F | Ht 72.0 in | Wt 238.8 lb

## 2024-04-28 DIAGNOSIS — F909 Attention-deficit hyperactivity disorder, unspecified type: Secondary | ICD-10-CM

## 2024-04-28 DIAGNOSIS — F330 Major depressive disorder, recurrent, mild: Secondary | ICD-10-CM | POA: Diagnosis not present

## 2024-04-28 DIAGNOSIS — F331 Major depressive disorder, recurrent, moderate: Secondary | ICD-10-CM | POA: Diagnosis not present

## 2024-04-28 MED ORDER — METHYLPHENIDATE HCL ER (OSM) 36 MG PO TBCR: 72.0000 mg | EXTENDED_RELEASE_TABLET | Freq: Every day | ORAL | 0 refills | Status: DC

## 2024-04-28 MED ORDER — ESCITALOPRAM OXALATE 10 MG PO TABS: 10.0000 mg | ORAL_TABLET | Freq: Every day | ORAL | 0 refills | Status: DC

## 2024-04-28 MED ORDER — BUPROPION HCL ER (XL) 300 MG PO TB24: 300.0000 mg | ORAL_TABLET | Freq: Every day | ORAL | 1 refills | Status: AC

## 2024-04-28 NOTE — Patient Instructions (Signed)
 Continue lexapro  10 mg daily Continue bupropion  300 mg daily  Continue Concerta  72 mg daily  Next appointment-  12/11 at 4 pm

## 2024-05-11 ENCOUNTER — Ambulatory Visit (INDEPENDENT_AMBULATORY_CARE_PROVIDER_SITE_OTHER): Admitting: Clinical

## 2024-05-11 DIAGNOSIS — F331 Major depressive disorder, recurrent, moderate: Secondary | ICD-10-CM

## 2024-05-11 DIAGNOSIS — F909 Attention-deficit hyperactivity disorder, unspecified type: Secondary | ICD-10-CM | POA: Diagnosis not present

## 2024-05-11 NOTE — Progress Notes (Signed)
   Darice Seats, LCSW

## 2024-05-11 NOTE — Progress Notes (Signed)
 [facility] Counselor/Therapist Progress Note  Patient ID: Darren Mason, MRN: [id number],    Date: 05/11/2024  Time Spent: 12:40pm - 1:33pm : 53 minutes   Treatment Type: Individual Therapy  Reported Symptoms: sadness associated with loss  Mental Status Exam: Appearance:  Neat and Well Groomed     Behavior: Appropriate  Motor: Normal  Speech/Language:  Clear and Coherent and Normal Rate  Affect: Appropriate  Mood: normal  Thought process: normal  Thought content:   WNL  Sensory/Perceptual disturbances:   WNL  Orientation: oriented to person, place, time/date, and situation  Attention: Good  Concentration: Good  Memory: WNL  Fund of knowledge:  Good  Insight:   Good  Judgment:  Good  Impulse Control: Good   Risk Assessment: Danger to Self:  No Patient denied current suicidal ideation  Self-injurious Behavior: No Danger to Others: No Patient denied current homicidal ideation Duty to Warn:no Physical Aggression / Violence:No  Access to Firearms a concern: No  Gang Involvement:No   Subjective:  Patient stated, nothing major that sticks out, I feel like things are progressively getting better for me, my wife, my son in response to events since last session. Patient stated, the colder it gets, it adds another level of pain. Patient stated, I've been trying to live more in the moment. Patient stated, its been good in response to mood since last session. Patient stated, I feel like I've lost my mom, my brother, half of my family, stepfather. Patient reported patient previously felt mad in response to loss of relationships with family members. Patient reported feelings of sadness in response to loss of relationships. Patient stated, I'm upset with them, I'm confused to some degree in reference to family members. Patient stated, I do wish things could be different. Patient reported mother, brother, stepfather do not acknowledge patient/patient's family  members when out in public. During today's session, patient shared events that precipitated conflict with mother and loss of relationship. Patient reported conflict with mother regarding finances while mother/stepfather were living with patient, mother's decisions regarding patient's son, and conflict between patient and patient's brother. Patient stated, we're not on speaking terms with my mom or step dad or with Thom (brother) or Corean (sister in social worker). Patient stated, its like they're (family members) trying to control the narrative and flip the script.   Interventions: Cognitive Behavioral Therapy and supportive therapy. Clinician conducted session in person at clinician's office at [facility]. Reviewed events since last session and assessed for changes. Explored feelings of loss/grief in response to loss of relationships with family members and stages of grief associated with losses. Provided supportive therapy and active listening as patient discussed events that precipitated conflict with family members and status of current relationships.   Collaboration of Care: Other not required at this time   Diagnosis:  Major depressive disorder, recurrent episode, moderate (HCC)   Attention deficit hyperactivity disorder (ADHD), unspecified ADHD type     Plan: Patient is to utilize [employer], thought re-framing, mindfulness and coping strategies to decrease symptoms associated with their diagnosis. Frequency: bi-weekly  Modality: individual      Long-term goal:   Reduce overall level, frequency, and intensity of the feelings of depression as evidenced by depressed mood, low energy, fatigue, loss of interest, psychomotor retardation, fluctuations in sleep, decreased concentration, negative self talk from 7 days/week to 1 to 2 days/week per patient's report for at least 3 consecutive months.  Target Date: 04/26/25  Progress: progressing  Short-term goal:   Identify, challenge, and replace negative thought patterns, such as, catastrophizing, and negative self talk that contribute to feelings of depression and frustration with positive thoughts, beliefs, and positive self talk per patient's report Target Date: 04/26/25  Progress: progressing    Practice mindfulness exercises daily  Target Date: 04/26/25  Progress: progressing    Decrease feelings of frustration by practicing coping strategies Target Date: 04/26/25  Progress: progressing    Begin a healthy grieving process as it relates to loss of relationship with mother, brother, sister in law, niece Target Date: 04/26/25  Progress: progressing                                  Darice Seats, LCSW

## 2024-05-31 ENCOUNTER — Ambulatory Visit: Admitting: Clinical
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
## 2024-06-16 ENCOUNTER — Ambulatory Visit: Admitting: Psychiatry

## 2024-06-22 ENCOUNTER — Ambulatory Visit: Admitting: Clinical

## 2024-06-22 DIAGNOSIS — F331 Major depressive disorder, recurrent, moderate: Secondary | ICD-10-CM

## 2024-06-22 DIAGNOSIS — F909 Attention-deficit hyperactivity disorder, unspecified type: Secondary | ICD-10-CM | POA: Diagnosis not present

## 2024-06-22 NOTE — Progress Notes (Signed)
   Darice Seats, LCSW

## 2024-06-22 NOTE — Progress Notes (Signed)
 [facility] Counselor/Therapist Progress Note  Patient ID: Darren Mason, MRN: [id number],    Date: 06/22/2024  Time Spent: 12:35pm - 1:33pm : 58 minutes   Treatment Type: Individual Therapy  Reported Symptoms: none reported  Mental Status Exam: Appearance:  Neat and Well Groomed     Behavior: Appropriate  Motor: Normal  Speech/Language:  Clear and Coherent and Normal Rate  Affect: Appropriate  Mood: normal  Thought process: normal  Thought content:   WNL  Sensory/Perceptual disturbances:   pain  Orientation: oriented to person, place, time/date, and situation  Attention: Good  Concentration: Good  Memory: WNL  Fund of knowledge:  Good  Insight:   Good  Judgment:  Good  Impulse Control: Good   Risk Assessment: Danger to Self:  No Patient denied current suicidal ideation  Self-injurious Behavior: No Danger to Others: No Patient denied current homicidal ideation Duty to Warn:no Physical Aggression / Violence:No  Access to Firearms a concern: No  Gang Involvement:No   Subjective: Patient stated, they've been going good, its a whole lot of stuff going on in response to events since last session. Patient reported cancelling previous therapy appointment due to travel plans. Patient stated, mentally I've been doing better, its seems like I'm trending upward, physically I have been in extra pain in response to mood since last session. Patient reported patient's pain management provider recently prescribed hydro morphine 4 mg 4 times per day. Patient reported really good visit with wife's parents since last session. Patient reported concern related to changes in wife's parents' health.  Patient stated, its always been extremely good in reference to relationship with wife's parents and stated, I'm really close to them. Patient reported thoughts, this is what normal feels like, this is how it should be in reference to family dynamics when around wife's  parents. Patient reported feeling out of place initially when around wife's family due to differences in family dynamics. Patient stated, its completely different, her family versus my family. Patient reported patient will not see his family during the holidays and stated, its better that way. Patient reported no communication with patient's family in a year. Patient stated, when it comes down to it I'm sad about it, I'm depressed about it in reference to relationship with patient's family. Patient reported patient's parents moved out of patient's home due to conflict. Patient stated, I think my mom and my brother are always use to getting their way and getting what they want. Patient reported prayer is a strength for patient. Patient stated, I try to be thankful for stuff, I try to be positive, I'm determined to stay positive. Patient stated, my mood is really good today.   Interventions: Cognitive Behavioral Therapy and supportive therapy. Clinician conducted session in person at clinician's office at [facility]. Discussed recently cancelled therapy appointment. Reviewed events since last session and assessed for changes. Discussed family dynamics as it relates to patient's relationship with wife's parents. Explored thoughts and feelings associated with family dynamics as it relates to patient's family and upcoming holidays. Provided supportive therapy and active listening as patient discussed conflict with family and patient's relationship with patient's family. Discussed patient's strengths.   Collaboration of Care: Other not required at this time   Diagnosis:  Major depressive disorder, recurrent episode, moderate (HCC)   Attention deficit hyperactivity disorder (ADHD), unspecified ADHD type     Plan: Patient is to utilize [employer], thought re-framing, mindfulness and coping strategies to decrease symptoms associated with their  diagnosis. Frequency:  bi-weekly  Modality: individual      Long-term goal:   Reduce overall level, frequency, and intensity of the feelings of depression as evidenced by depressed mood, low energy, fatigue, loss of interest, psychomotor retardation, fluctuations in sleep, decreased concentration, negative self talk from 7 days/week to 1 to 2 days/week per patient's report for at least 3 consecutive months.  Target Date: 04/26/25  Progress: progressing    Short-term goal:  Identify, challenge, and replace negative thought patterns, such as, catastrophizing, and negative self talk that contribute to feelings of depression and frustration with positive thoughts, beliefs, and positive self talk per patient's report Target Date: 04/26/25  Progress: progressing    Practice mindfulness exercises daily  Target Date: 04/26/25  Progress: progressing    Decrease feelings of frustration by practicing coping strategies Target Date: 04/26/25  Progress: progressing    Begin a healthy grieving process as it relates to loss of relationship with mother, brother, sister in law, niece Target Date: 04/26/25  Progress: progressing                              Darice Seats, LCSW

## 2024-07-11 ENCOUNTER — Other Ambulatory Visit: Payer: Self-pay | Admitting: Psychiatry

## 2024-07-11 ENCOUNTER — Telehealth: Payer: Self-pay

## 2024-07-11 MED ORDER — METHYLPHENIDATE HCL ER (OSM) 36 MG PO TBCR: 72.0000 mg | EXTENDED_RELEASE_TABLET | Freq: Every day | ORAL | 0 refills | Status: DC

## 2024-07-11 NOTE — Telephone Encounter (Signed)
 Pt calling to request methylphenidate  (CONCERTA ) 36 MG PO CR tablet  Pharmacy:[id number] - [place], [place] - [address]  Last seen:04/28/24 Next apt: 08/11/24

## 2024-07-11 NOTE — Telephone Encounter (Signed)
 Ordered

## 2024-07-24 ENCOUNTER — Other Ambulatory Visit: Payer: Self-pay | Admitting: Psychiatry

## 2024-07-27 ENCOUNTER — Encounter: Payer: Self-pay | Admitting: Oncology

## 2024-08-02 ENCOUNTER — Encounter: Payer: Self-pay | Admitting: Oncology

## 2024-08-03 ENCOUNTER — Ambulatory Visit: Payer: Self-pay | Admitting: Clinical
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
## 2024-08-11 ENCOUNTER — Encounter: Payer: Self-pay | Admitting: Psychiatry

## 2024-08-11 ENCOUNTER — Ambulatory Visit (INDEPENDENT_AMBULATORY_CARE_PROVIDER_SITE_OTHER): Admitting: Psychiatry

## 2024-08-11 ENCOUNTER — Other Ambulatory Visit: Payer: Self-pay

## 2024-08-11 ENCOUNTER — Encounter: Payer: Self-pay | Admitting: Oncology

## 2024-08-11 VITALS — BP 138/88 | HR 72 | Temp 97.8°F | Ht 73.0 in | Wt 217.4 lb

## 2024-08-11 DIAGNOSIS — F330 Major depressive disorder, recurrent, mild: Secondary | ICD-10-CM | POA: Diagnosis not present

## 2024-08-11 DIAGNOSIS — F909 Attention-deficit hyperactivity disorder, unspecified type: Secondary | ICD-10-CM | POA: Diagnosis not present

## 2024-08-11 MED ORDER — ESCITALOPRAM OXALATE 10 MG PO TABS: 10.0000 mg | ORAL_TABLET | Freq: Every day | ORAL | 0 refills | Status: AC

## 2024-08-11 MED ORDER — METHYLPHENIDATE HCL ER (OSM) 36 MG PO TBCR: 72.0000 mg | EXTENDED_RELEASE_TABLET | Freq: Every day | ORAL | 0 refills | Status: AC

## 2024-08-11 NOTE — Patient Instructions (Signed)
 Continue lexapro  10 mg daily  Continue bupropion  300 mg daily  Continue Concerta  72 mg daily  Next appointment-  4/13 at 1 PM

## 2024-10-17 ENCOUNTER — Ambulatory Visit: Admitting: Psychiatry

## 2024-12-06 ENCOUNTER — Encounter: Admitting: Primary Care
